# Patient Record
Sex: Male | Born: 1960 | Race: Black or African American | Hispanic: No | Marital: Single | Smoking: Current every day smoker
Health system: Southern US, Community
[De-identification: ages and names within clinical notes are randomized; demographics above are authoritative.]

## PROBLEM LIST (undated history)

## (undated) DIAGNOSIS — I739 Peripheral vascular disease, unspecified: Secondary | ICD-10-CM

## (undated) DIAGNOSIS — Z992 Dependence on renal dialysis: Secondary | ICD-10-CM

## (undated) DIAGNOSIS — I499 Cardiac arrhythmia, unspecified: Secondary | ICD-10-CM

## (undated) DIAGNOSIS — N186 End stage renal disease: Secondary | ICD-10-CM

## (undated) DIAGNOSIS — J189 Pneumonia, unspecified organism: Secondary | ICD-10-CM

## (undated) DIAGNOSIS — N189 Chronic kidney disease, unspecified: Secondary | ICD-10-CM

## (undated) DIAGNOSIS — J45909 Unspecified asthma, uncomplicated: Secondary | ICD-10-CM

## (undated) DIAGNOSIS — K7460 Unspecified cirrhosis of liver: Secondary | ICD-10-CM

## (undated) DIAGNOSIS — J449 Chronic obstructive pulmonary disease, unspecified: Secondary | ICD-10-CM

## (undated) DIAGNOSIS — I10 Essential (primary) hypertension: Secondary | ICD-10-CM

## (undated) DIAGNOSIS — K219 Gastro-esophageal reflux disease without esophagitis: Secondary | ICD-10-CM

## (undated) HISTORY — DX: End stage renal disease: N18.6

## (undated) HISTORY — DX: Cardiac arrhythmia, unspecified: I49.9

## (undated) HISTORY — DX: Unspecified asthma, uncomplicated: J45.909

## (undated) HISTORY — DX: End stage renal disease: Z99.2

## (undated) HISTORY — DX: Unspecified cirrhosis of liver: K74.60

---

## 1986-07-10 HISTORY — PX: OTHER SURGICAL HISTORY: SHX169

## 1989-07-10 HISTORY — PX: NECK SURGERY: SHX720

## 1998-02-04 ENCOUNTER — Emergency Department (HOSPITAL_COMMUNITY): Admission: EM | Admit: 1998-02-04 | Discharge: 1998-02-04 | Payer: Self-pay | Admitting: Emergency Medicine

## 2003-05-25 ENCOUNTER — Ambulatory Visit (HOSPITAL_COMMUNITY): Admission: RE | Admit: 2003-05-25 | Discharge: 2003-05-25 | Payer: Self-pay | Admitting: Family Medicine

## 2003-06-16 ENCOUNTER — Ambulatory Visit (HOSPITAL_COMMUNITY): Admission: RE | Admit: 2003-06-16 | Discharge: 2003-06-16 | Payer: Self-pay | Admitting: Family Medicine

## 2004-05-25 ENCOUNTER — Inpatient Hospital Stay (HOSPITAL_COMMUNITY): Admission: AD | Admit: 2004-05-25 | Discharge: 2004-05-30 | Payer: Self-pay | Admitting: Family Medicine

## 2004-05-25 ENCOUNTER — Ambulatory Visit (HOSPITAL_COMMUNITY): Admission: RE | Admit: 2004-05-25 | Discharge: 2004-05-25 | Payer: Self-pay | Admitting: Family Medicine

## 2004-06-09 ENCOUNTER — Ambulatory Visit (HOSPITAL_COMMUNITY): Admission: RE | Admit: 2004-06-09 | Discharge: 2004-06-09 | Payer: Self-pay | Admitting: Family Medicine

## 2004-11-21 ENCOUNTER — Ambulatory Visit (HOSPITAL_COMMUNITY): Admission: RE | Admit: 2004-11-21 | Discharge: 2004-11-21 | Payer: Self-pay | Admitting: Pulmonary Disease

## 2005-05-26 ENCOUNTER — Ambulatory Visit (HOSPITAL_COMMUNITY): Admission: RE | Admit: 2005-05-26 | Discharge: 2005-05-26 | Payer: Self-pay | Admitting: Pulmonary Disease

## 2005-11-13 ENCOUNTER — Ambulatory Visit (HOSPITAL_COMMUNITY): Admission: RE | Admit: 2005-11-13 | Discharge: 2005-11-13 | Payer: Self-pay | Admitting: Pulmonary Disease

## 2006-04-16 ENCOUNTER — Ambulatory Visit (HOSPITAL_COMMUNITY): Admission: RE | Admit: 2006-04-16 | Discharge: 2006-04-16 | Payer: Self-pay | Admitting: Pulmonary Disease

## 2006-10-24 ENCOUNTER — Ambulatory Visit (HOSPITAL_COMMUNITY): Admission: RE | Admit: 2006-10-24 | Discharge: 2006-10-24 | Payer: Self-pay | Admitting: Pulmonary Disease

## 2007-05-30 ENCOUNTER — Ambulatory Visit (HOSPITAL_COMMUNITY): Admission: RE | Admit: 2007-05-30 | Discharge: 2007-05-30 | Payer: Self-pay | Admitting: Pulmonary Disease

## 2008-01-30 ENCOUNTER — Ambulatory Visit (HOSPITAL_COMMUNITY): Admission: RE | Admit: 2008-01-30 | Discharge: 2008-01-30 | Payer: Self-pay | Admitting: Pulmonary Disease

## 2008-06-15 ENCOUNTER — Ambulatory Visit (HOSPITAL_COMMUNITY): Admission: RE | Admit: 2008-06-15 | Discharge: 2008-06-15 | Payer: Self-pay | Admitting: Pulmonary Disease

## 2008-12-28 ENCOUNTER — Ambulatory Visit (HOSPITAL_COMMUNITY): Admission: RE | Admit: 2008-12-28 | Discharge: 2008-12-28 | Payer: Self-pay | Admitting: Pulmonary Disease

## 2009-05-15 ENCOUNTER — Inpatient Hospital Stay (HOSPITAL_COMMUNITY): Admission: EM | Admit: 2009-05-15 | Discharge: 2009-05-17 | Payer: Self-pay | Admitting: Emergency Medicine

## 2009-06-07 ENCOUNTER — Ambulatory Visit (HOSPITAL_COMMUNITY): Admission: RE | Admit: 2009-06-07 | Discharge: 2009-06-07 | Payer: Self-pay | Admitting: Family Medicine

## 2009-07-12 ENCOUNTER — Ambulatory Visit (HOSPITAL_COMMUNITY): Admission: RE | Admit: 2009-07-12 | Discharge: 2009-07-12 | Payer: Self-pay | Admitting: Pulmonary Disease

## 2010-02-07 ENCOUNTER — Ambulatory Visit (HOSPITAL_COMMUNITY): Admission: RE | Admit: 2010-02-07 | Discharge: 2010-02-07 | Payer: Self-pay | Admitting: Pulmonary Disease

## 2010-06-27 ENCOUNTER — Ambulatory Visit (HOSPITAL_COMMUNITY)
Admission: RE | Admit: 2010-06-27 | Discharge: 2010-06-27 | Payer: Self-pay | Source: Home / Self Care | Attending: Pulmonary Disease | Admitting: Pulmonary Disease

## 2010-10-12 LAB — DIFFERENTIAL
Basophils Absolute: 0 10*3/uL (ref 0.0–0.1)
Basophils Relative: 0 % (ref 0–1)
Basophils Relative: 0 % (ref 0–1)
Eosinophils Absolute: 0 10*3/uL (ref 0.0–0.7)
Eosinophils Absolute: 0.1 10*3/uL (ref 0.0–0.7)
Lymphocytes Relative: 18 % (ref 12–46)
Monocytes Absolute: 1.1 10*3/uL — ABNORMAL HIGH (ref 0.1–1.0)
Monocytes Absolute: 1.3 10*3/uL — ABNORMAL HIGH (ref 0.1–1.0)
Monocytes Relative: 13 % — ABNORMAL HIGH (ref 3–12)
Monocytes Relative: 14 % — ABNORMAL HIGH (ref 3–12)
Neutro Abs: 4.8 10*3/uL (ref 1.7–7.7)
Neutro Abs: 7.1 10*3/uL (ref 1.7–7.7)
Neutro Abs: 9.9 10*3/uL — ABNORMAL HIGH (ref 1.7–7.7)
Neutrophils Relative %: 73 % (ref 43–77)

## 2010-10-12 LAB — COMPREHENSIVE METABOLIC PANEL
ALT: 42 U/L (ref 0–53)
Albumin: 2.6 g/dL — ABNORMAL LOW (ref 3.5–5.2)
Alkaline Phosphatase: 236 U/L — ABNORMAL HIGH (ref 39–117)
Alkaline Phosphatase: 258 U/L — ABNORMAL HIGH (ref 39–117)
BUN: 16 mg/dL (ref 6–23)
Calcium: 8.6 mg/dL (ref 8.4–10.5)
Chloride: 96 mEq/L (ref 96–112)
Creatinine, Ser: 2 mg/dL — ABNORMAL HIGH (ref 0.4–1.5)
Creatinine, Ser: 2.6 mg/dL — ABNORMAL HIGH (ref 0.4–1.5)
GFR calc Af Amer: 32 mL/min — ABNORMAL LOW (ref >60)
GFR calc Af Amer: 44 mL/min — ABNORMAL LOW (ref >60)
GFR calc non Af Amer: 27 mL/min — ABNORMAL LOW (ref >60)
Potassium: 4.6 mEq/L (ref 3.5–5.1)
Total Protein: 6.6 g/dL (ref 6.0–8.3)

## 2010-10-12 LAB — CBC
HCT: 32.9 % — ABNORMAL LOW (ref 39.0–52.0)
Hemoglobin: 11.4 g/dL — ABNORMAL LOW (ref 13.0–17.0)
MCHC: 34.6 g/dL (ref 30.0–36.0)
MCV: 101 fL — ABNORMAL HIGH (ref 78.0–100.0)
MCV: 101.1 fL — ABNORMAL HIGH (ref 78.0–100.0)
Platelets: 218 10*3/uL (ref 150–400)
Platelets: 241 10*3/uL (ref 150–400)
RBC: 3.26 MIL/uL — ABNORMAL LOW (ref 4.22–5.81)
RDW: 13.3 % (ref 11.5–15.5)
WBC: 13.6 10*3/uL — ABNORMAL HIGH (ref 4.0–10.5)

## 2010-10-12 LAB — CULTURE, BLOOD (ROUTINE X 2): Culture: NO GROWTH

## 2010-10-12 LAB — POCT CARDIAC MARKERS
CKMB, poc: <1 ng/mL — ABNORMAL LOW (ref 1.0–8.0)
Myoglobin, poc: 101 ng/mL (ref 12–200)
Troponin i, poc: <0.05 ng/mL (ref 0.00–0.09)

## 2010-10-12 LAB — RENAL FUNCTION PANEL
BUN: 14 mg/dL (ref 6–23)
CO2: 24 mEq/L (ref 19–32)
Chloride: 104 mEq/L (ref 96–112)
Glucose, Bld: 195 mg/dL — ABNORMAL HIGH (ref 70–99)
Potassium: 4.3 mEq/L (ref 3.5–5.1)

## 2010-10-12 LAB — RAPID URINE DRUG SCREEN, HOSP PERFORMED
Benzodiazepines: NOT DETECTED
Cocaine: NOT DETECTED
Opiates: POSITIVE — AB
Tetrahydrocannabinol: POSITIVE — AB

## 2010-10-12 LAB — VITAMIN B12: Vitamin B-12: 845 pg/mL (ref 211–911)

## 2010-10-12 LAB — MAGNESIUM: Magnesium: 2 mg/dL (ref 1.5–2.5)

## 2010-10-12 LAB — RETICULOCYTES
RBC.: 3.22 MIL/uL — ABNORMAL LOW (ref 4.22–5.81)
Retic Count, Absolute: 64.4 10*3/uL (ref 19.0–186.0)
Retic Ct Pct: 2 % (ref 0.4–3.1)

## 2010-10-12 LAB — IRON AND TIBC: UIBC: 248 ug/dL

## 2010-10-12 LAB — LEGIONELLA ANTIGEN, URINE: Legionella Antigen, Urine: NEGATIVE

## 2010-10-12 LAB — CALCIUM: Calcium: 8.3 mg/dL — ABNORMAL LOW (ref 8.4–10.5)

## 2010-11-25 NOTE — Consult Note (Signed)
Luke Mueller, Luke Mueller                 ACCOUNT NO.:  [id number]   MEDICAL RECORD NO.:  [id number]          PATIENT TYPE:  INP   LOCATION:  A305                          FACILITY:  [facility]   PHYSICIAN:  Edward L. Luan Pulling, M.D.DATE OF BIRTH:  12/06/1960   DATE OF CONSULTATION:  DATE OF DISCHARGE:                                   CONSULTATION   REASON FOR CONSULTATION:  Abnormal chest CT.   HISTORY OF PRESENT ILLNESS:  Luke Mueller is a 50 year old who has had right-  sided chest pain for 2-3 days prior to admission.  He underwent a chest x-  ray which showed a pneumothorax and he is admitted because of that.  He has  had some cough, general malaise and shortness of breath with exertion.   PAST MEDICAL HISTORY:  Positive for hypertension, COPD, chronic renal  insufficiency.  Positive for a neck fracture in 1992.  He had apparently a  cardiac contusion at that time as well.   CURRENT MEDICATIONS:  Lotrel 5/10.  Vicodin 5/500.  Skelaxin 800 mg and  Avelox.   SOCIAL HISTORY:  He smokes about one half pack of cigarettes daily and he  does drink a significant amount of alcohol.   FAMILY HISTORY:  His mother is alive at 77 with hypertension.  Father also  is 53, health status is unknown.   REVIEW OF SYSTEMS:  Except as mentioned essentially negative.  He does not  have any chronic productive cough.   PHYSICAL EXAMINATION:  GENERAL:  Shows a well-developed, well-nourished male  who is somewhat thin but in no acute distress.  VITAL SIGNS:  Temperature 97.8, pulse 74, respirations 20, blood pressure  138/90, O2 saturation is 100%.  CHEST:  Relatively clear.  HEART:  Regular.  ABDOMEN:  Soft.  EXTREMITIES:  Showed no edema.  NEUROLOGIC:  CNS is grossly intact.   LABORATORY DATA:  A pH 7.36, PCO2 42, PO2 82, white count 8000, hemoglobin  13.3.  Chest x-ray showed a moderate hydropneumothorax and a CT shows that  he has a right pneumothorax about 20% with a small right effusion.  There  are  blebs in the right lung apex.  There is some on the left.  On the right  there was also a focal area of bronchiectasis.   ASSESSMENT:  He has blebs and they are mostly on the right lung, some on the  left.  He is at risk of another pneumothorax but generally is felt that it  is best to wait for at least two episodes of pneumothoraces before any  procedures are undertaken to try to correct blebs.  If he did have another  pneumothorax he should probably be referred for pleurodesis.  In addition to  that he does have a small area of bronchiectasis which I believe is  asymptomatic.  He does have some cough but he has not got typical  bronchiectasis with cough productive of sputum, etc.  I do not think he  needs any treatment for this unless he develops symptoms.  Thanks for  allowing me to see him with  you.     Edwa   ELH/MEDQ  D:  05/26/2004  T:  05/27/2004  Job:  [id number]   cc:   Barrie Folk. Berdine Addison, MD  [address]  [place]  [place] [address]  Fax: [phone]

## 2010-11-25 NOTE — Discharge Summary (Signed)
NAMEJACORRI, CROMBIE                 ACCOUNT NO.:  [id number]   MEDICAL RECORD NO.:  [id number]          PATIENT TYPE:  INP   LOCATION:  A305                          FACILITY:  [facility]   PHYSICIAN:  Barrie Folk. Berdine Addison, MD     DATE OF BIRTH:  [birth year]   DATE OF ADMISSION:  05/25/2004  DATE OF DISCHARGE:  11/21/2005LH                                 DISCHARGE SUMMARY   HISTORY OF PRESENT ILLNESS:  The patient was a 50 year old single,  unemployed black male from [place], [place].   CHIEF COMPLAINT:  Right chest pain.   PAST MEDICAL HISTORY:  History was positive for right chest pain x24 hours  before admitting him.  The pain was described as chest discomfort  exacerbated by cough.  The cough was occasionally productive.  The sputum  was described as white.  History is also positive for general malaise x48  hours.  He denied fever, chills, nausea, vomiting, hemoptysis, shortness of  breath and wheezing.  The patient did experience shortness of breath with  mild exertion.   Past medical history was positive for chronic, obstructive pulmonary  disease, chronic renal insufficiency and hypertension.   ALLERGIES:  The patient was not allergic to any known medication.   HABITS:  Positive for cigarette smoking, one-half pack per day; started  August 16, ethanol use (beer) two 40-ounces per day; starting age 73, and  former use of street drugs, (marijuana and cocaine).  The patient denied the  use of IV drugs.  Sexually transmitted disease history was positive for  gonorrhea infection in his 20's treated at the emergency room at [facility], [place].   PAST MEDICAL HISTORY:  Past hospitalization for neck fracture in 1992,  treated in [place], [place] by surgery, and hospitalization in 1994  for trouble holding urine at [place] [employer], [place].   FAMILY HISTORY:  Mother living at age 78 with a history of hypertension.  Father living at age 76, health unknown.   PROBLEMS:   Right chest pain secondary to acute right hydropneumothorax.  General appearance revealed a small frame, middle-aged, medium height black  male who appeared not to feel well, but in no apparent respiratory distress.  Vital signs in the office, pulse 80, respirations 18, blood pressure 100/74,  O2 saturations 98% on room air.  Lungs demonstrated no air movement on  auscultation of the right lung field.  Auscultation of the left lung field  demonstrated air movement.  Heart revealed audible S1 and S2 without murmur.  Rhythm was regular and rate was within normal limits.  The patient was  neurologically intact.   LABORATORY DATA:  Significant labs on admission were as follows:  ABGs on  room air on May 25, 2004, at [place] [facility], at rest:  pH 7.36, pCO2 42.7, pO2  82.6, O2 saturations 95.2.  White count 8, hemoglobin 13.3, hematocrit 38.3.  Platelets 286,000.  Sodium 138, potassium 4.4, chloride 105, CO2 24.   Chest x-ray on admission demonstrated the presence of hydropneumothorax.   CT of the chest with contrast on May 25, 2004, was read as follows:  1.  Stable right hydropneumothorax when compared to chest x-ray earlier      today.  2.  Multiple blebs in the right apex and the medial right lower lobe      extending to the azygoesophageal recess.  3.  A few scattered blebs involving the left lung.  4.  Chronic obstructive pulmonary disease (emphysema and chronic      bronchitis).  The lungs themselves were clear as read by  Dr. Evangeline Dakin.   The patient was treated with IV fluids, oxygen at two liters per minute via  nasal cannula, analgesia for pain, O2 saturation q.6h. x48 hours, and other  supportive measures.  Moreover, pulmonary consult was ordered.  Dr. Luan Pulling,  pulmonologist, did see the patient during this hospitalization.  Dr. Luan Pulling  saw the patient on May 26, 2004, and his impression was that the  patient had blebs, and they are mostly on the right lung,  some on the left.  He indicated that he was at risk for another pneumothorax and generally felt  that it was best to wait for at least two episodes of pneumothoraces before  any procedure is undertaken to try to correct blebs.  He indicated also that  if the patient did have another pneumothorax, he should probably be referred  for pleurodesis.  Also, Dr. Luan Pulling indicated the patient had a small area  of bronchiectasis which he believed was asymptomatic.  He indicated that his  cough did not reflect typical bronchiectasis since it was productive.  He  indicated that he thought the patient did not need any treatment unless he  developed symptoms.   HOSPITAL COURSE:  The patient's hospital course was uneventful.  He was  treated with Zithromax 500 mg IV daily x2 and then 250 mg IV daily, and  Levaquin 750 mg p.o. daily.  Moreover, he was treated with guaifenesin 600  mg p.o. b.i.d.  The patient experienced decreased pneumothorax by chest x-  ray on May 30, 2004, compared to May 27, 2004.  Radiology read it  as small right pneumothorax and improving expansion of right lung.  Moreover, no effusion or segmental consolidation were seen.  The left lung  field was clear.   Problem #1.  Chronic bronchitic change was present as read by Dr. Lavonia Dana.  The patient was discharged home on May 30, 2004, on  antibiotics.  He was advised to come to the emergency room if he experienced  shortness of breath or significant right chest pain. He was ambulatory on  room air without problem at the time of discharge.  He was alert and  oriented to person, place and time.  He was able to lie flat in bed without  discomfort.  Appetite had improved.   Problem #2.  Hypertension.  Blood pressure on admission was 100/74.  The  patient was treated with a low-sodium diet and Lotrel.  The patient  developed some swelling of his upper lip during this hospitalization. Therefore, Lotrel was stopped.  He  was treated with Solu-Medrol IV q.6h. x24  hours.  The patient did not experience swelling of the tongue or throat.  Swelling of lip was resolved at the time of discharge.  Verapamil was  elected to treat blood pressure during the remainder of the hospitalization.  The patient's blood pressure was 120/84 on the morning of discharge.   Problem #3.  Chronic renal insufficiency.  BUN on admission was 26 and  creatinine 3.  The patient is being followed by Dr. Alison Murray,  pertaining to his renal condition.  He will continue to be monitored and  managed by Dr. Lowanda Foster as an outpatient.  At the time of discharge, he had  no swelling of legs or face.  His appetite improved with treatment of his  chronic bronchitis with antibiotics.   Problem #4.  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  The patient was treated with topical analgesic, p.o. analgesic  and muscle relaxant.  The patient did get some relief.   DISCHARGE INSTRUCTIONS:  1.  Diet:  Low salt.  2.  Activity:  No restrictions.   DISCHARGE MEDICATIONS:  1.  Verelan PM 200 mg, one capsule p.o. every bedtime.  2.  Myoflex cream to neck twice a day.  3.  Avelox 400 mg, one tablet every day.  4.  Robitussin two teaspoons p.o. four times a day.  5.  Vicodin 5/500, one tablet every 12 hours.  6.  Skelaxin 800 mg, one tablet every day as needed.   FOLLOWUP:  1.  With Dr. Berdine Addison x1 week.  2.  With Dr. Luan Pulling x2 weeks.   PRIMARY DIAGNOSES:  1.  Right chest pain secondary to acute right hydropneumothorax.  2.  Chronic obstructive pulmonary disease with bronchiectasis.   SECONDARY DIAGNOSES:  1.  Hypertension.  2.  Chronic renal insufficiency.  3.  Chronic neck pain, status post surgery secondary to trauma.     Andi Devon  D:  06/02/2004  T:  06/02/2004  Job:  [id number]

## 2010-11-25 NOTE — H&P (Signed)
NAMEDEKARI, KOETTING                 ACCOUNT NO.:  [id number]   MEDICAL RECORD NO.:  [id number]          PATIENT TYPE:  INP   LOCATION:  A305                          FACILITY:  [facility]   PHYSICIAN:  Barrie Folk. Berdine Addison, MD     DATE OF BIRTH:  03/03/1961   DATE OF ADMISSION:  05/25/2004  DATE OF DISCHARGE:  LH                                HISTORY & PHYSICAL   IDENTIFYING DATA:  The patient is a 50 year old single, unemployed black  male from [place], [place].   CHIEF COMPLAINT:  Right chest pain.   HISTORY OF PRESENT ILLNESS:  History is positive for right chest pain x24  hours.  Pain is described as a chest discomfort exacerbated by cough.  The  cough is occasionally productive.  Sputum is described as white.  He also  complains of general malaise x48 hours.  He denies fever, chills, nausea,  vomiting, hemoptysis, shortness of breath at rest and wheezing.  The patient  experienced some shortness of breath with moderate exertion.   PAST MEDICAL HISTORY:  Medical history is positive here for chronic  obstructive pulmonary disease, chronic renal insufficiency, and  hypertension.  Medical history is negative for diabetes, tuberculosis,  cancer, sickle cell, asthma and seizure disorder.   MEDICATIONS ON ADMISSION:  1.  Lotrel 5/10, one capsule p.o. every day.  2.  Vicodin 5/500, one tablet p.o. q.12h. p.r.n.  3.  Skelaxin 800 mg p.o. daily.  4.  Avelox 400 mg p.o. daily.   ALLERGIES:  The patient is not allergic to any known medication.   HABITS:  Positive for cigarette smoking, one-half pack per day, starting at  age 72, and ethanol use (beer, two 40-ounces per day) starting at age 58.  Habits positive for former use of street drugs (marijuana and cocaine).  The  patient denies the use of IV drugs.  Sexually-transmitted disease history is  positive for gonorrhea infection in his 26, treated at emergency room at  [facility], [place].  Sexually transmitted disease history is  negative  for syphilis, herpes, and HIV infection.   PAST MEDICAL HISTORY:  Positive for hospitalization for neck fracture in  1992 in [place], [place], and hospitalization in 1994 for trouble  holding urine, in [place], [place].   FAMILY HISTORY:  Mother living at 19 with history of hypertension.  Father  is living at 67, health unknown.   REVIEW OF SYSTEMS:  Positive for chronic poor appetite since February of  2004, weight loss of approximately 10 pounds over one year, chronic neck  pain.  Review of systems is negative for hemoptysis, chronic headache,  epistaxis, bleeding gums, hematemesis, gross hematuria, dysuria, gross  hematuria, melena, diarrhea, skin rash, adenopathy involving the neck and  groin, penile lesions, penile discharge, edema of legs, night sweats,  dysphagia, unexplained fever, etc.   PHYSICAL EXAMINATION:  VITAL SIGNS:  Vitals in the office, pulse 80,  respirations 18, blood pressure 100/74.  O2 saturations 98% on room air.  GENERAL APPEARANCE:  A middle-aged, small frame, medium height, alert, black  male who does not look well, but in no apparent respiratory distress.  HEENT:  Positive for thinning of hair.  Ears:  Normal auricle.  External  canals patent.  Tympanic membranes are pearly gray.  Eyes:  Lids negative  for ptosis.  Sclerae are white.  Positive arcus senilis bilaterally.  Pupils  round, equal and reactive to light.  Extraocular movement intact.  Nose  negative for discharge.  Mouth, positive for missing teeth. The remaining  dentition is fair.  No bleeding gums.  Posterior pharynx is benign.  NECK:  Posterior, positive for old, healed vertical surgical scar.  No  cervical adenopathy.  No thyromegaly in supraclavicular space.  No palpable  nodes.  LUNGS:  No air movement on auscultation of right lung field.  Positive for  air movement of left lung field on auscultation.  HEART:  Audible S1 and S2 without murmur.  Regular rate and  rhythm.  ABDOMEN:  Flat, hyperactive bowel sounds, soft, nontender in all four  quadrant.  No palpable mass and no organomegaly.  GENITALIA:  Penis uncircumcised.  No penile lesion or discharge.  Scrotum,  palpable testicles without nausea or tenderness.  RECTUM:  No external lesion.  EXTREMITIES:  No edema, no joint swelling, no joint redness, no joint  hotness.  NEUROLOGIC:  Alert and oriented to person, place and time.  Cranial nerves  II-XII appeared intact.   LABORATORY DATA:  ABG's on room air, pH 7.36, pCO2 42.7, pO2 82.6, O2  saturations 95.2%.  White count 8.0, hemoglobin 13.3, hematocrit 38.3,  platelets 286,000.  Urine drug screen negative.  Urinalysis, pH 6.0,  specific gravity 1.015, no glucose, no bilirubin, no ketones, no blood, no  protein, negative for nitrites, no leukocytes.   Chest [facility] on May 25, 2003, at [facility] is read as COPD  without evidence of acute cardiopulmonary disease.  Chest x-ray on May 25, 2004, at [facility] was read as small to moderate right  hydropneumothorax with mild right upper lobe volume loss as read by Dr. Marylynn Pearson.   PRIMARY DIAGNOSIS:  Right chest discomfort secondary to right  hydropneumothorax.   SECONDARY DIAGNOSES:  1.  Chronic obstructive pulmonary disease.  2.  Hypertension.  3.  Chronic renal insufficiency.   PLAN:  1.  IV normal saline at [facility].  2.  Surgical evaluation of right lung volume loss.  3.  Pulmonary consultation by Dr. Luan Pulling.  4.  Diet:  4 grams sodium.   DISCHARGE INSTRUCTIONS:  1.  Activity as tolerated.  2.  O2 at two liters via nasal cannula p.r.n.  3.  Lotrel 5/10, one capsule p.o. every day.  4.  Skelaxin 800 mg p.o. daily.  5.  Analgesic, Vicodin 5/500 mg, one tablet p.o. q.6-8h. p.r.n. for pain.  6.  CT of the chest.     Andi Devon  D:  05/25/2004  T:  05/25/2004  Job:  [id number]

## 2011-01-20 ENCOUNTER — Other Ambulatory Visit (HOSPITAL_COMMUNITY): Payer: Self-pay | Admitting: Pulmonary Disease

## 2011-01-20 ENCOUNTER — Ambulatory Visit (HOSPITAL_COMMUNITY)
Admission: RE | Admit: 2011-01-20 | Discharge: 2011-01-20 | Disposition: A | Discharge status: Home / Self Care | Payer: Self-pay | Source: Ambulatory Visit | Attending: Pulmonary Disease | Admitting: Pulmonary Disease

## 2011-01-20 DIAGNOSIS — J449 Chronic obstructive pulmonary disease, unspecified: Secondary | ICD-10-CM

## 2011-01-20 DIAGNOSIS — J4489 Other specified chronic obstructive pulmonary disease: Secondary | ICD-10-CM | POA: Insufficient documentation

## 2011-05-11 ENCOUNTER — Ambulatory Visit (HOSPITAL_COMMUNITY)
Admission: RE | Admit: 2011-05-11 | Discharge: 2011-05-11 | Disposition: A | Discharge status: Home / Self Care | Payer: Self-pay | Source: Ambulatory Visit | Attending: Pulmonary Disease | Admitting: Pulmonary Disease

## 2011-05-11 ENCOUNTER — Other Ambulatory Visit (HOSPITAL_COMMUNITY): Payer: Self-pay | Admitting: Pulmonary Disease

## 2011-05-11 DIAGNOSIS — J9383 Other pneumothorax: Secondary | ICD-10-CM | POA: Insufficient documentation

## 2011-05-11 DIAGNOSIS — J4489 Other specified chronic obstructive pulmonary disease: Secondary | ICD-10-CM | POA: Insufficient documentation

## 2011-05-11 DIAGNOSIS — J449 Chronic obstructive pulmonary disease, unspecified: Secondary | ICD-10-CM | POA: Insufficient documentation

## 2011-05-11 DIAGNOSIS — R0789 Other chest pain: Secondary | ICD-10-CM | POA: Insufficient documentation

## 2011-10-17 ENCOUNTER — Other Ambulatory Visit (HOSPITAL_COMMUNITY): Payer: Self-pay | Admitting: Pulmonary Disease

## 2011-10-17 ENCOUNTER — Ambulatory Visit (HOSPITAL_COMMUNITY)
Admission: RE | Admit: 2011-10-17 | Discharge: 2011-10-17 | Disposition: A | Discharge status: Home / Self Care | Payer: Medicaid Other | Source: Ambulatory Visit | Attending: Pulmonary Disease | Admitting: Pulmonary Disease

## 2011-10-17 DIAGNOSIS — J45909 Unspecified asthma, uncomplicated: Secondary | ICD-10-CM | POA: Insufficient documentation

## 2012-04-03 ENCOUNTER — Telehealth: Payer: Self-pay

## 2012-04-03 NOTE — Telephone Encounter (Signed)
Called again. Still busy.

## 2012-04-03 NOTE — Telephone Encounter (Signed)
Called. Line busy

## 2012-04-04 ENCOUNTER — Telehealth: Payer: Self-pay

## 2012-04-04 NOTE — Telephone Encounter (Signed)
Called pt. He was just waking up. Luke Mueller he will gets his meds/insurance together and give me a call a little later.

## 2012-04-04 NOTE — Telephone Encounter (Signed)
Luke Mueller called, returning your call. Please call him back. Thanks.

## 2012-04-05 NOTE — Telephone Encounter (Signed)
LMOM to call.

## 2012-04-10 ENCOUNTER — Telehealth: Payer: Self-pay

## 2012-04-10 NOTE — Telephone Encounter (Signed)
Gastroenterology Pre-Procedure Form    Request Date: 04/10/2012     Requesting Physician: Legrand Rams     PATIENT INFORMATION:  Luke Mueller is a 51 y.o., male (DOB=1961-06-08).  PROCEDURE: Procedure(s) requested: colonoscopy Procedure Reason: screening for colon cancer  PATIENT REVIEW QUESTIONS: The patient reports the following:   1. Diabetes Melitis: no 2. Joint replacements in the past 12 months: no 3. Major health problems in the past 3 months: no 4. Has an artificial valve or MVP:no 5. Has been advised in past to take antibiotics in advance of a procedure like teeth cleaning: no}    MEDICATIONS & ALLERGIES:    Patient reports the following regarding taking any blood thinners:   Plavix? no Aspirin?no Coumadin?  no  Patient confirms/reports the following medications:  Current Outpatient Prescriptions  Medication Sig Dispense Refill  . albuterol (PROVENTIL HFA;VENTOLIN HFA) 108 (90 BASE) MCG/ACT inhaler Inhale 2 puffs into the lungs every 6 (six) hours as needed.      Marland Kitchen amLODipine (NORVASC) 10 MG tablet Take 10 mg by mouth daily.      . carvedilol (COREG) 12.5 MG tablet Take 12.5 mg by mouth 2 (two) times daily with a meal.      . cyclobenzaprine (FLEXERIL) 10 MG tablet Take 10 mg by mouth 3 (three) times daily as needed.      . NON FORMULARY Dulera   Inhaler as directed      . NON FORMULARY Vitamin D   0.25 mcg daily      . sodium bicarbonate 650 MG tablet Take 650 mg by mouth 1 day or 1 dose.      . traMADol (ULTRAM) 50 MG tablet Take 50 mg by mouth every 6 (six) hours as needed.        Patient confirms/reports the following allergies:  Allergies  Allergen Reactions  . Lotrel (Amlodipine Besy-Benazepril Hcl) Swelling    Lips swelling    Patient is appropriate to schedule for requested procedure(s): yes  AUTHORIZATION INFORMATION Primary Insurance:   ID #:   Group #:  Pre-Cert / Auth required:  Pre-Cert / Auth #:   Secondary Insurance:   ID #:   Group #:  Pre-Cert  / Auth required: Pre-Cert / Auth #:  No orders of the defined types were placed in this encounter.    SCHEDULE INFORMATION: Procedure has been scheduled as follows:  Date: 04/15/2012    Time: 2:30 PM  Location: [facility] Short Stay  This Gastroenterology Pre-Precedure Form is being routed to the following provider(s) for review: Barney Drain, MD

## 2012-04-10 NOTE — Telephone Encounter (Signed)
See triage phone call.

## 2012-04-11 ENCOUNTER — Other Ambulatory Visit: Payer: Self-pay

## 2012-04-11 DIAGNOSIS — Z139 Encounter for screening, unspecified: Secondary | ICD-10-CM

## 2012-04-11 MED ORDER — PEG-KCL-NACL-NASULF-NA ASC-C 100 G PO SOLR: 1.0000 | ORAL | Status: DC

## 2012-04-11 NOTE — Telephone Encounter (Signed)
Rx was sent to [facility]. Pt will come by the office Friday 04/12/2012 about 9:00 AM to go over his prep instructions.

## 2012-04-11 NOTE — Telephone Encounter (Signed)
MOVI PREP SPLIT DOSING, REGULAR BREAKFAST. CLEAR LIQUIDS AFTER 9 AM.  

## 2012-04-12 ENCOUNTER — Telehealth: Payer: Self-pay

## 2012-04-12 NOTE — Telephone Encounter (Signed)
No will try a TCS IF CAN'T GET HIM TO SLEEP CAN ALWAYS DO A FLEX SIG.         ----- Message -----   From: Anell Barr, LPN   Sent: [id number] 9:11 AM   To: Danie Binder, MD      Dr. Oneida Alar, I had this pt come by for his instructions for his prep for his procedure on Mon. When he came in, I smelled alcohol. So I asked him that in addition to his meds does he drink alcoholic beverages. He said 6 pack of beer about every other day. Occasionally a litter liquor. DO you want him rescheduled from [facility]? Please advise!

## 2012-04-15 ENCOUNTER — Ambulatory Visit (HOSPITAL_COMMUNITY)
Admission: RE | Admit: 2012-04-15 | Discharge: 2012-04-15 | Disposition: A | Discharge status: Home / Self Care | Payer: Medicaid Other | Source: Ambulatory Visit | Attending: Gastroenterology | Admitting: Gastroenterology

## 2012-04-15 ENCOUNTER — Encounter (HOSPITAL_COMMUNITY)
Admission: RE | Disposition: A | Discharge status: Home / Self Care | Payer: Self-pay | Source: Ambulatory Visit | Attending: Gastroenterology

## 2012-04-15 ENCOUNTER — Encounter (HOSPITAL_COMMUNITY): Payer: Self-pay | Admitting: *Deleted

## 2012-04-15 DIAGNOSIS — Z1211 Encounter for screening for malignant neoplasm of colon: Secondary | ICD-10-CM

## 2012-04-15 DIAGNOSIS — J4489 Other specified chronic obstructive pulmonary disease: Secondary | ICD-10-CM | POA: Insufficient documentation

## 2012-04-15 DIAGNOSIS — J449 Chronic obstructive pulmonary disease, unspecified: Secondary | ICD-10-CM | POA: Insufficient documentation

## 2012-04-15 DIAGNOSIS — I10 Essential (primary) hypertension: Secondary | ICD-10-CM | POA: Insufficient documentation

## 2012-04-15 DIAGNOSIS — K648 Other hemorrhoids: Secondary | ICD-10-CM

## 2012-04-15 HISTORY — DX: Essential (primary) hypertension: I10

## 2012-04-15 HISTORY — PX: COLONOSCOPY: SHX5424

## 2012-04-15 HISTORY — DX: Chronic obstructive pulmonary disease, unspecified: J44.9

## 2012-04-15 HISTORY — DX: Chronic kidney disease, unspecified: N18.9

## 2012-04-15 SURGERY — COLONOSCOPY
Anesthesia: Moderate Sedation

## 2012-04-15 MED ORDER — STERILE WATER FOR IRRIGATION IR SOLN
Status: DC | PRN
  Administered 2012-04-15: 15:00:00

## 2012-04-15 MED ORDER — MIDAZOLAM HCL 5 MG/5ML IJ SOLN
INTRAMUSCULAR | Status: DC | PRN
  Administered 2012-04-15: 1 mg via INTRAVENOUS
  Administered 2012-04-15 (×2): 2 mg via INTRAVENOUS

## 2012-04-15 MED ORDER — MEPERIDINE HCL 100 MG/ML IJ SOLN
INTRAMUSCULAR | Status: DC | PRN
  Administered 2012-04-15 (×2): 50 mg via INTRAVENOUS

## 2012-04-15 MED ORDER — HYDRALAZINE HCL 20 MG/ML IJ SOLN
INTRAMUSCULAR | Status: AC
  Filled 2012-04-15: qty 1

## 2012-04-15 MED ORDER — HYDRALAZINE HCL 20 MG/ML IJ SOLN
INTRAMUSCULAR | Status: DC | PRN
  Administered 2012-04-15: 10 mg via INTRAVENOUS

## 2012-04-15 MED ORDER — MEPERIDINE HCL 100 MG/ML IJ SOLN
INTRAMUSCULAR | Status: AC
  Filled 2012-04-15: qty 2

## 2012-04-15 MED ORDER — MIDAZOLAM HCL 5 MG/5ML IJ SOLN
INTRAMUSCULAR | Status: AC
  Filled 2012-04-15: qty 10

## 2012-04-15 MED ORDER — SODIUM CHLORIDE 0.9 % IV SOLN
INTRAVENOUS | Status: DC
  Administered 2012-04-15: 500 mL via INTRAVENOUS

## 2012-04-15 NOTE — Progress Notes (Signed)
Blood pressure high intra operatively Dr Oneida Alar ordered 10mg  hydralazine. Given per Eldwin Volkov [place],RN.

## 2012-04-15 NOTE — Op Note (Signed)
Luke Mueller Luke Mueller Mueller, [id number]   COLONOSCOPY PROCEDURE REPORT  PATIENT: Luke Mueller, Luke Mueller Mueller  MR#: [id number] BIRTHDATE: [birth year] , 51  yrs. old GENDER: Male ENDOSCOPIST: Barney Drain, MD REFERRED [id number] Luke Mueller Mueller, M.D. PROCEDURE DATE:  04/15/2012 PROCEDURE:   Colonoscopy, screening INDICATIONS:average risk patient for colon cancer. MEDICATIONS: Demerol 100 mg IV and Versed 5 mg IV  HYDRALAZINE 10 MG IV  DESCRIPTION OF PROCEDURE:    Physical exam was performed.  Informed consent was obtained from the patient after explaining the benefits, risks, and alternatives to procedure.  The patient was connected to monitor and placed in left lateral position. Continuous oxygen was provided by nasal cannula and IV medicine administered through an indwelling cannula.  After administration of sedation and rectal exam, the patients rectum was intubated and the [id number]  colonoscope was advanced under direct visualization to the ileum.  The scope was removed slowly by carefully examining the color, texture, anatomy, and integrity mucosa on the way out.  The patient was recovered in endoscopy and discharged home in satisfactory condition.       COLON FINDINGS: The colon was otherwise normal.  There was no diverticulosis, inflammation, polyps or cancers unless previously stated.  , The mucosa appeared normal in the terminal ileum.  , and Large internal hemorrhoids were found.  PREP QUALITY: excellent. CECAL W/D TIME: 10 minutes  COMPLICATIONS: None  ENDOSCOPIC IMPRESSION: 1.   The colon was otherwise normal 2.   Normal mucosa in the terminal ileum 3.   Large internal hemorrhoids   RECOMMENDATIONS: HIGH FIBER DIET TCS IN 10 YEARS       _______________________________ eSignedBarney Drain, MD 04/15/2012 3:07 PM
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
## 2012-04-24 ENCOUNTER — Encounter (HOSPITAL_COMMUNITY): Payer: Self-pay | Admitting: Gastroenterology

## 2012-04-29 ENCOUNTER — Ambulatory Visit (HOSPITAL_COMMUNITY)
Admission: RE | Admit: 2012-04-29 | Discharge: 2012-04-29 | Disposition: A | Discharge status: Home / Self Care | Payer: Medicaid Other | Source: Ambulatory Visit | Attending: Pulmonary Disease | Admitting: Pulmonary Disease

## 2012-04-29 ENCOUNTER — Other Ambulatory Visit (HOSPITAL_COMMUNITY): Payer: Self-pay | Admitting: Pulmonary Disease

## 2012-04-29 DIAGNOSIS — J45909 Unspecified asthma, uncomplicated: Secondary | ICD-10-CM

## 2012-04-29 DIAGNOSIS — R0609 Other forms of dyspnea: Secondary | ICD-10-CM | POA: Insufficient documentation

## 2012-04-29 DIAGNOSIS — R0989 Other specified symptoms and signs involving the circulatory and respiratory systems: Secondary | ICD-10-CM | POA: Insufficient documentation

## 2012-08-13 ENCOUNTER — Other Ambulatory Visit (HOSPITAL_COMMUNITY): Payer: Self-pay | Admitting: Internal Medicine

## 2012-08-13 DIAGNOSIS — I739 Peripheral vascular disease, unspecified: Secondary | ICD-10-CM

## 2012-08-14 ENCOUNTER — Other Ambulatory Visit (HOSPITAL_COMMUNITY): Payer: Self-pay | Admitting: Internal Medicine

## 2012-08-14 ENCOUNTER — Ambulatory Visit (HOSPITAL_COMMUNITY)
Admission: RE | Admit: 2012-08-14 | Discharge: 2012-08-14 | Disposition: A | Discharge status: Home / Self Care | Payer: Medicaid Other | Source: Ambulatory Visit | Attending: Internal Medicine | Admitting: Internal Medicine

## 2012-08-14 DIAGNOSIS — I739 Peripheral vascular disease, unspecified: Secondary | ICD-10-CM

## 2012-08-14 DIAGNOSIS — I743 Embolism and thrombosis of arteries of the lower extremities: Secondary | ICD-10-CM | POA: Insufficient documentation

## 2012-09-05 ENCOUNTER — Other Ambulatory Visit (HOSPITAL_COMMUNITY): Payer: Self-pay | Admitting: Pulmonary Disease

## 2012-09-05 ENCOUNTER — Ambulatory Visit (HOSPITAL_COMMUNITY)
Admission: RE | Admit: 2012-09-05 | Discharge: 2012-09-05 | Disposition: A | Discharge status: Home / Self Care | Payer: Medicaid Other | Source: Ambulatory Visit | Attending: Pulmonary Disease | Admitting: Pulmonary Disease

## 2012-09-05 DIAGNOSIS — R079 Chest pain, unspecified: Secondary | ICD-10-CM

## 2012-10-01 ENCOUNTER — Emergency Department (HOSPITAL_COMMUNITY)
Admission: EM | Admit: 2012-10-01 | Discharge: 2012-10-01 | Disposition: A | Discharge status: Home / Self Care | Payer: Medicaid Other | Attending: Emergency Medicine | Admitting: Emergency Medicine

## 2012-10-01 ENCOUNTER — Encounter (HOSPITAL_COMMUNITY): Payer: Self-pay | Admitting: *Deleted

## 2012-10-01 DIAGNOSIS — Z79899 Other long term (current) drug therapy: Secondary | ICD-10-CM | POA: Insufficient documentation

## 2012-10-01 DIAGNOSIS — N189 Chronic kidney disease, unspecified: Secondary | ICD-10-CM | POA: Insufficient documentation

## 2012-10-01 DIAGNOSIS — I129 Hypertensive chronic kidney disease with stage 1 through stage 4 chronic kidney disease, or unspecified chronic kidney disease: Secondary | ICD-10-CM | POA: Insufficient documentation

## 2012-10-01 DIAGNOSIS — I959 Hypotension, unspecified: Secondary | ICD-10-CM

## 2012-10-01 DIAGNOSIS — R42 Dizziness and giddiness: Secondary | ICD-10-CM | POA: Insufficient documentation

## 2012-10-01 DIAGNOSIS — E878 Other disorders of electrolyte and fluid balance, not elsewhere classified: Secondary | ICD-10-CM

## 2012-10-01 DIAGNOSIS — F17200 Nicotine dependence, unspecified, uncomplicated: Secondary | ICD-10-CM | POA: Insufficient documentation

## 2012-10-01 DIAGNOSIS — J4489 Other specified chronic obstructive pulmonary disease: Secondary | ICD-10-CM | POA: Insufficient documentation

## 2012-10-01 DIAGNOSIS — J449 Chronic obstructive pulmonary disease, unspecified: Secondary | ICD-10-CM | POA: Insufficient documentation

## 2012-10-01 DIAGNOSIS — IMO0001 Reserved for inherently not codable concepts without codable children: Secondary | ICD-10-CM | POA: Insufficient documentation

## 2012-10-01 DIAGNOSIS — E871 Hypo-osmolality and hyponatremia: Secondary | ICD-10-CM | POA: Insufficient documentation

## 2012-10-01 DIAGNOSIS — I739 Peripheral vascular disease, unspecified: Secondary | ICD-10-CM | POA: Insufficient documentation

## 2012-10-01 DIAGNOSIS — R55 Syncope and collapse: Secondary | ICD-10-CM | POA: Insufficient documentation

## 2012-10-01 DIAGNOSIS — R5381 Other malaise: Secondary | ICD-10-CM | POA: Insufficient documentation

## 2012-10-01 DIAGNOSIS — E860 Dehydration: Secondary | ICD-10-CM | POA: Insufficient documentation

## 2012-10-01 DIAGNOSIS — I951 Orthostatic hypotension: Secondary | ICD-10-CM | POA: Insufficient documentation

## 2012-10-01 HISTORY — DX: Peripheral vascular disease, unspecified: I73.9

## 2012-10-01 LAB — TROPONIN I: Troponin I: <0.3 ng/mL (ref <0.30)

## 2012-10-01 LAB — COMPREHENSIVE METABOLIC PANEL
ALT: 17 U/L (ref 0–53)
Alkaline Phosphatase: 91 U/L (ref 39–117)
BUN: 65 mg/dL — ABNORMAL HIGH (ref 6–23)
CO2: 20 mEq/L (ref 19–32)
Chloride: 95 mEq/L — ABNORMAL LOW (ref 96–112)
GFR calc Af Amer: 12 mL/min — ABNORMAL LOW (ref >90)
GFR calc non Af Amer: 10 mL/min — ABNORMAL LOW (ref >90)
Glucose, Bld: 113 mg/dL — ABNORMAL HIGH (ref 70–99)
Potassium: 4.3 mEq/L (ref 3.5–5.1)
Total Bilirubin: 0.7 mg/dL (ref 0.3–1.2)

## 2012-10-01 LAB — CBC WITH DIFFERENTIAL/PLATELET
Basophils Absolute: 0 10*3/uL (ref 0.0–0.1)
Basophils Relative: 0 % (ref 0–1)
Lymphocytes Relative: 29 % (ref 12–46)
MCHC: 34.8 g/dL (ref 30.0–36.0)
Neutro Abs: 4 10*3/uL (ref 1.7–7.7)
Platelets: 212 10*3/uL (ref 150–400)
RDW: 12.2 % (ref 11.5–15.5)
WBC: 6.1 10*3/uL (ref 4.0–10.5)

## 2012-10-01 MED ORDER — ROCURONIUM BROMIDE 50 MG/5ML IV SOLN
INTRAVENOUS | Status: AC
  Filled 2012-10-01: qty 2

## 2012-10-01 MED ORDER — SODIUM CHLORIDE 0.9 % IV SOLN: 1000.0000 mL | INTRAVENOUS | Status: DC

## 2012-10-01 MED ORDER — ETOMIDATE 2 MG/ML IV SOLN
INTRAVENOUS | Status: AC
  Filled 2012-10-01: qty 20

## 2012-10-01 MED ORDER — SODIUM CHLORIDE 0.9 % IV SOLN
1000.0000 mL | Freq: Once | INTRAVENOUS | Status: AC
  Administered 2012-10-01: 1000 mL via INTRAVENOUS

## 2012-10-01 MED ORDER — SUCCINYLCHOLINE CHLORIDE 20 MG/ML IJ SOLN
INTRAMUSCULAR | Status: AC
  Filled 2012-10-01: qty 1

## 2012-10-01 MED ORDER — LIDOCAINE HCL (CARDIAC) 20 MG/ML IV SOLN
INTRAVENOUS | Status: AC
  Filled 2012-10-01: qty 5
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
## 2012-10-01 NOTE — ED Notes (Signed)
Pt was at [facility] heart and vascular dr office signing in to be seen for right leg pain when he began to feel like he was going to pass out. Pt did fall to floor, hit his chin on counter at desk, hit head on the floor when he fell backwards, pt admits to loc, staff at [place] reported that pt did not experience any LOC. bp 64/50, hr 52. On arrival to er pt alert, able to answer all questions, pt c/o pain to chin area.

## 2012-11-20 ENCOUNTER — Encounter: Payer: Self-pay | Admitting: *Deleted

## 2012-11-22 ENCOUNTER — Encounter: Payer: Self-pay | Admitting: Internal Medicine

## 2012-11-22 ENCOUNTER — Ambulatory Visit (INDEPENDENT_AMBULATORY_CARE_PROVIDER_SITE_OTHER): Payer: Medicaid Other | Admitting: Internal Medicine

## 2012-11-22 VITALS — BP 100/70 | HR 65 | Ht 70.0 in | Wt 126.0 lb

## 2012-11-22 DIAGNOSIS — I10 Essential (primary) hypertension: Secondary | ICD-10-CM | POA: Insufficient documentation

## 2012-11-22 DIAGNOSIS — N184 Chronic kidney disease, stage 4 (severe): Secondary | ICD-10-CM

## 2012-11-22 DIAGNOSIS — I739 Peripheral vascular disease, unspecified: Secondary | ICD-10-CM

## 2012-11-22 DIAGNOSIS — J449 Chronic obstructive pulmonary disease, unspecified: Secondary | ICD-10-CM | POA: Insufficient documentation
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
## 2012-11-22 NOTE — Patient Instructions (Signed)
Your physician recommends that you continue on your current medications as directed. Please refer to the Current Medication list given to you today.  Call or return to clinic prn if these symptoms worsen or fail to improve as anticipated.  

## 2013-01-17 ENCOUNTER — Other Ambulatory Visit: Payer: Self-pay | Admitting: *Deleted

## 2013-01-17 DIAGNOSIS — Z01810 Encounter for preprocedural cardiovascular examination: Secondary | ICD-10-CM

## 2013-01-17 DIAGNOSIS — N185 Chronic kidney disease, stage 5: Secondary | ICD-10-CM

## 2013-01-27 ENCOUNTER — Encounter: Payer: Self-pay | Admitting: Vascular Surgery

## 2013-01-28 ENCOUNTER — Encounter: Payer: Self-pay | Admitting: Vascular Surgery

## 2013-01-28 ENCOUNTER — Other Ambulatory Visit: Payer: Self-pay

## 2013-01-28 ENCOUNTER — Encounter (INDEPENDENT_AMBULATORY_CARE_PROVIDER_SITE_OTHER): Payer: Medicaid Other | Admitting: *Deleted

## 2013-01-28 ENCOUNTER — Ambulatory Visit (INDEPENDENT_AMBULATORY_CARE_PROVIDER_SITE_OTHER): Payer: Medicaid Other | Admitting: Vascular Surgery

## 2013-01-28 VITALS — BP 115/77 | HR 82 | Resp 16 | Ht 70.0 in | Wt 124.0 lb

## 2013-01-28 DIAGNOSIS — Z01810 Encounter for preprocedural cardiovascular examination: Secondary | ICD-10-CM

## 2013-01-28 DIAGNOSIS — N186 End stage renal disease: Secondary | ICD-10-CM

## 2013-01-28 DIAGNOSIS — N185 Chronic kidney disease, stage 5: Secondary | ICD-10-CM

## 2013-01-28 NOTE — Progress Notes (Signed)
The patient presents today for discussion of AV access. He has a several year history of progressive chronic renal insufficiency and has been recommended that he undergo hemodialysis access placement. He is here today with his mother. He has had no treatments for hemodialysis in the past. He is quite hesitant to consider hemodialysis and has declined offers for placement of access in the past.  Past Medical History  Diagnosis Date  . Hypertension   . COPD (chronic obstructive pulmonary disease)   . Chronic kidney disease   . Peripheral vascular disease   . Asthma   . Irregular heartbeat     History  Substance Use Topics  . Smoking status: Current Every Day Smoker -- 0.50 packs/day for 20 years    Types: Cigarettes  . Smokeless tobacco: Not on file  . Alcohol Use: 14.4 oz/week    24 Cans of beer per week     Comment: four to five beers weekly    Family History  Problem Relation Age of Onset  . Cancer Father 30    stomach  . Hypertension Mother     Allergies  Allergen Reactions  . Lotrel (Amlodipine Besy-Benazepril Hcl) Swelling    Lips swelling    Current outpatient prescriptions:albuterol (PROVENTIL HFA;VENTOLIN HFA) 108 (90 BASE) MCG/ACT inhaler, Inhale 2 puffs into the lungs every 6 (six) hours as needed for wheezing or shortness of breath. , Disp: , Rfl: ;  amLODipine (NORVASC) 10 MG tablet, Take 10 mg by mouth daily., Disp: , Rfl: ;  budesonide-formoterol (SYMBICORT) 160-4.5 MCG/ACT inhaler, Inhale 2 puffs into the lungs 2 (two) times daily., Disp: , Rfl:  calcitRIOL (ROCALTROL) 0.25 MCG capsule, Take 0.25 mcg by mouth daily., Disp: , Rfl: ;  carvedilol (COREG) 12.5 MG tablet, Take 12.5 mg by mouth 2 (two) times daily with a meal., Disp: , Rfl: ;  clopidogrel (PLAVIX) 75 MG tablet, Take 75 mg by mouth daily., Disp: , Rfl: ;  cyclobenzaprine (FLEXERIL) 10 MG tablet, Take 10 mg by mouth 3 (three) times daily as needed for muscle spasms. , Disp: , Rfl:  IRON PO, Take 1 tablet by  mouth daily., Disp: , Rfl: ;  Multiple Vitamin (MULTIVITAMIN WITH MINERALS) TABS, Take 1 tablet by mouth daily., Disp: , Rfl: ;  sodium bicarbonate 650 MG tablet, Take 650 mg by mouth daily. , Disp: , Rfl: ;  traMADol (ULTRAM) 50 MG tablet, Take 50 mg by mouth every 6 (six) hours as needed for pain. , Disp: , Rfl: ;  vitamin C (ASCORBIC ACID) 500 MG tablet, Take 500 mg by mouth daily., Disp: , Rfl:  megestrol (MEGACE) 40 MG/ML suspension, Take one tablespoonful (15 ml) by mouth daily, Disp: , Rfl:   BP 115/77  Pulse 82  Resp 16  Ht 5\' 10"  (1.778 m)  Wt 124 lb (56.246 kg)  BMI 17.79 kg/m2  SpO2 97%  Body mass index is 17.79 kg/(m^2).       Physical exam a thin gentleman in no acute distress 2+ radial pulses bilaterally Skin without ulcers or rashes Neurologically he is grossly intact Respirations equal and nonlabored Very superficial surface veins bilaterally. Small to moderate size in the cephalic vein in the forearm  Venous mapping of his bilateral cephalic and basilic veins reveal better caliber on the right than the left. This ranges from 0.16 2.3 on the right.  Impression and plan insufficiency and need for hemodialysis access. I discussed options to include hemodialysis catheter, AV fistula an AV graft. Although his  veins are rather small by duplex they are very superficial and appear to be the tight fit very well may dilate with arterial pressure. I have recommended a right Cimino AV fistula creation as his initial attempt at a hemodialysis. I did explain potential for non-maturation of the fistula. He had reports that he is willing to consider this but will not consent to surgery before one month. We have tentatively scheduled him for right Cimino AV fistula as an outpatient at [facility] on 01/24/2013

## 2013-01-29 ENCOUNTER — Encounter: Payer: Self-pay | Admitting: Nephrology

## 2013-02-10 ENCOUNTER — Encounter (HOSPITAL_COMMUNITY): Payer: Self-pay

## 2013-02-22 NOTE — Progress Notes (Signed)
Attempted to contact number listed x 2. Both times call went to VM when I started talking I could here the receiver being picked up with background noise then the phone got hung up.

## 2013-02-23 MED ORDER — DEXTROSE 5 % IV SOLN
1.5000 g | INTRAVENOUS | Status: AC
  Administered 2013-02-24: 1.5 g via INTRAVENOUS
  Filled 2013-02-23: qty 1.5

## 2013-02-24 ENCOUNTER — Encounter (HOSPITAL_COMMUNITY): Payer: Self-pay | Admitting: *Deleted

## 2013-02-24 ENCOUNTER — Ambulatory Visit (HOSPITAL_COMMUNITY)
Admission: RE | Admit: 2013-02-24 | Discharge: 2013-02-24 | Disposition: A | Discharge status: Home / Self Care | Payer: Medicaid Other | Source: Ambulatory Visit | Attending: Vascular Surgery | Admitting: Vascular Surgery

## 2013-02-24 ENCOUNTER — Ambulatory Visit (HOSPITAL_COMMUNITY): Payer: Medicaid Other | Admitting: Anesthesiology

## 2013-02-24 ENCOUNTER — Encounter (HOSPITAL_COMMUNITY)
Admission: RE | Disposition: A | Discharge status: Home / Self Care | Payer: Self-pay | Source: Ambulatory Visit | Attending: Vascular Surgery

## 2013-02-24 ENCOUNTER — Encounter (HOSPITAL_COMMUNITY): Payer: Self-pay | Admitting: Anesthesiology

## 2013-02-24 DIAGNOSIS — N184 Chronic kidney disease, stage 4 (severe): Secondary | ICD-10-CM

## 2013-02-24 DIAGNOSIS — I120 Hypertensive chronic kidney disease with stage 5 chronic kidney disease or end stage renal disease: Secondary | ICD-10-CM | POA: Insufficient documentation

## 2013-02-24 DIAGNOSIS — Z888 Allergy status to other drugs, medicaments and biological substances status: Secondary | ICD-10-CM | POA: Insufficient documentation

## 2013-02-24 DIAGNOSIS — J4489 Other specified chronic obstructive pulmonary disease: Secondary | ICD-10-CM | POA: Insufficient documentation

## 2013-02-24 DIAGNOSIS — N186 End stage renal disease: Secondary | ICD-10-CM | POA: Insufficient documentation

## 2013-02-24 DIAGNOSIS — F17200 Nicotine dependence, unspecified, uncomplicated: Secondary | ICD-10-CM | POA: Insufficient documentation

## 2013-02-24 DIAGNOSIS — Z7902 Long term (current) use of antithrombotics/antiplatelets: Secondary | ICD-10-CM | POA: Insufficient documentation

## 2013-02-24 DIAGNOSIS — I739 Peripheral vascular disease, unspecified: Secondary | ICD-10-CM | POA: Insufficient documentation

## 2013-02-24 DIAGNOSIS — J449 Chronic obstructive pulmonary disease, unspecified: Secondary | ICD-10-CM | POA: Insufficient documentation

## 2013-02-24 DIAGNOSIS — I499 Cardiac arrhythmia, unspecified: Secondary | ICD-10-CM | POA: Insufficient documentation

## 2013-02-24 HISTORY — PX: AV FISTULA PLACEMENT: SHX1204

## 2013-02-24 LAB — POCT I-STAT 4, (NA,K, GLUC, HGB,HCT): Glucose, Bld: 104 mg/dL — ABNORMAL HIGH (ref 70–99)

## 2013-02-24 SURGERY — ARTERIOVENOUS (AV) FISTULA CREATION
Anesthesia: General | Site: Arm Lower | Laterality: Right | Wound class: Clean

## 2013-02-24 MED ORDER — PHENYLEPHRINE HCL 10 MG/ML IJ SOLN
INTRAMUSCULAR | Status: DC | PRN
  Administered 2013-02-24 (×3): 40 ug via INTRAVENOUS

## 2013-02-24 MED ORDER — SODIUM CHLORIDE 0.9 % IR SOLN
Status: DC | PRN
  Administered 2013-02-24: 11:00:00

## 2013-02-24 MED ORDER — OXYCODONE-ACETAMINOPHEN 5-325 MG PO TABS: 1.0000 | ORAL_TABLET | ORAL | Status: DC | PRN

## 2013-02-24 MED ORDER — PROPOFOL INFUSION 10 MG/ML OPTIME
INTRAVENOUS | Status: DC | PRN
  Administered 2013-02-24: 75 ug/kg/min via INTRAVENOUS

## 2013-02-24 MED ORDER — 0.9 % SODIUM CHLORIDE (POUR BTL) OPTIME
TOPICAL | Status: DC | PRN
  Administered 2013-02-24: 1000 mL

## 2013-02-24 MED ORDER — ONDANSETRON HCL 4 MG/2ML IJ SOLN: 4.0000 mg | Freq: Once | INTRAMUSCULAR | Status: DC | PRN

## 2013-02-24 MED ORDER — MENTHOL 3 MG MT LOZG
LOZENGE | OROMUCOSAL | Status: AC
  Filled 2013-02-24: qty 9

## 2013-02-24 MED ORDER — CARVEDILOL 12.5 MG PO TABS
12.5000 mg | ORAL_TABLET | Freq: Once | ORAL | Status: AC
  Administered 2013-02-24: 12.5 mg via ORAL
  Filled 2013-02-24 (×2): qty 1

## 2013-02-24 MED ORDER — FENTANYL CITRATE 0.05 MG/ML IJ SOLN
INTRAMUSCULAR | Status: DC | PRN
  Administered 2013-02-24 (×2): 50 ug via INTRAVENOUS

## 2013-02-24 MED ORDER — LIDOCAINE HCL (CARDIAC) 20 MG/ML IV SOLN
INTRAVENOUS | Status: DC | PRN
  Administered 2013-02-24: 60 mg via INTRAVENOUS

## 2013-02-24 MED ORDER — SODIUM CHLORIDE 0.9 % IV SOLN
INTRAVENOUS | Status: DC
  Administered 2013-02-24: 10:00:00 via INTRAVENOUS

## 2013-02-24 MED ORDER — MIDAZOLAM HCL 5 MG/5ML IJ SOLN
INTRAMUSCULAR | Status: DC | PRN
  Administered 2013-02-24: 2 mg via INTRAVENOUS

## 2013-02-24 MED ORDER — LIDOCAINE-EPINEPHRINE 0.5 %-1:200000 IJ SOLN
INTRAMUSCULAR | Status: AC
  Filled 2013-02-24: qty 1

## 2013-02-24 MED ORDER — LIDOCAINE-EPINEPHRINE 0.5 %-1:200000 IJ SOLN
INTRAMUSCULAR | Status: DC | PRN
  Administered 2013-02-24: 50 mL

## 2013-02-24 MED ORDER — SODIUM CHLORIDE 0.9 % IV SOLN: INTRAVENOUS | Status: DC

## 2013-02-24 MED ORDER — PROPOFOL 10 MG/ML IV BOLUS
INTRAVENOUS | Status: DC | PRN
  Administered 2013-02-24: 120 mg via INTRAVENOUS

## 2013-02-24 MED ORDER — HYDROMORPHONE HCL PF 1 MG/ML IJ SOLN: 0.2500 mg | INTRAMUSCULAR | Status: DC | PRN

## 2013-02-24 MED ORDER — ALBUMIN HUMAN 5 % IV SOLN
INTRAVENOUS | Status: DC | PRN
  Administered 2013-02-24: 11:00:00 via INTRAVENOUS

## 2013-02-24 SURGICAL SUPPLY — 38 items
APL SKNCLS STERI-STRIP NONHPOA (GAUZE/BANDAGES/DRESSINGS) ×1
ARMBAND PINK RESTRICT EXTREMIT (MISCELLANEOUS) ×2 IMPLANT
BENZOIN TINCTURE PRP APPL 2/3 (GAUZE/BANDAGES/DRESSINGS) ×2 IMPLANT
BLADE SURG 10 STRL SS (BLADE) ×1 IMPLANT
CANISTER SUCTION 2500CC (MISCELLANEOUS) ×2 IMPLANT
CLIP LIGATING EXTRA MED SLVR (CLIP) ×2 IMPLANT
CLIP LIGATING EXTRA SM BLUE (MISCELLANEOUS) ×2 IMPLANT
CLOTH BEACON ORANGE TIMEOUT ST (SAFETY) ×2 IMPLANT
COVER PROBE W GEL 5X96 (DRAPES) ×2 IMPLANT
COVER SURGICAL LIGHT HANDLE (MISCELLANEOUS) ×2 IMPLANT
DECANTER SPIKE VIAL GLASS SM (MISCELLANEOUS) ×2 IMPLANT
ELECT REM PT RETURN 9FT ADLT (ELECTROSURGICAL) ×2
ELECTRODE REM PT RTRN 9FT ADLT (ELECTROSURGICAL) ×1 IMPLANT
GEL ULTRASOUND 20GR AQUASONIC (MISCELLANEOUS) IMPLANT
GLOVE BIOGEL PI IND STRL 6.5 (GLOVE) IMPLANT
GLOVE BIOGEL PI IND STRL 7.0 (GLOVE) IMPLANT
GLOVE BIOGEL PI INDICATOR 6.5 (GLOVE) ×1
GLOVE BIOGEL PI INDICATOR 7.0 (GLOVE) ×1
GLOVE ECLIPSE 6.5 STRL STRAW (GLOVE) ×1 IMPLANT
GLOVE SS BIOGEL STRL SZ 7.5 (GLOVE) ×1 IMPLANT
GLOVE SUPERSENSE BIOGEL SZ 7.5 (GLOVE) ×1
GOWN STRL NON-REIN LRG LVL3 (GOWN DISPOSABLE) ×4 IMPLANT
KIT BASIN OR (CUSTOM PROCEDURE TRAY) ×2 IMPLANT
KIT ROOM TURNOVER OR (KITS) ×2 IMPLANT
NDL HYPO 25GX1X1/2 BEV (NEEDLE) ×1 IMPLANT
NEEDLE HYPO 25GX1X1/2 BEV (NEEDLE) ×2 IMPLANT
NS IRRIG 1000ML POUR BTL (IV SOLUTION) ×2 IMPLANT
PACK CV ACCESS (CUSTOM PROCEDURE TRAY) ×2 IMPLANT
PAD ARMBOARD 7.5X6 YLW CONV (MISCELLANEOUS) ×4 IMPLANT
SPONGE GAUZE 4X4 12PLY (GAUZE/BANDAGES/DRESSINGS) ×1 IMPLANT
STRIP CLOSURE SKIN 1/2X4 (GAUZE/BANDAGES/DRESSINGS) ×2 IMPLANT
SUT PROLENE 6 0 CC (SUTURE) ×2 IMPLANT
SUT VIC AB 3-0 SH 27 (SUTURE) ×2
SUT VIC AB 3-0 SH 27X BRD (SUTURE) ×1 IMPLANT
TOWEL OR 17X24 6PK STRL BLUE (TOWEL DISPOSABLE) ×2 IMPLANT
TOWEL OR 17X26 10 PK STRL BLUE (TOWEL DISPOSABLE) ×2 IMPLANT
UNDERPAD 30X30 INCONTINENT (UNDERPADS AND DIAPERS) ×2 IMPLANT
WATER STERILE IRR 1000ML POUR (IV SOLUTION) ×2 IMPLANT

## 2013-02-24 NOTE — H&P (View-Only) (Signed)
The patient presents today for discussion of AV access. He has a several year history of progressive chronic renal insufficiency and has been recommended that he undergo hemodialysis access placement. He is here today with his mother. He has had no treatments for hemodialysis in the past. He is quite hesitant to consider hemodialysis and has declined offers for placement of access in the past.  Past Medical History  Diagnosis Date  . Hypertension   . COPD (chronic obstructive pulmonary disease)   . Chronic kidney disease   . Peripheral vascular disease   . Asthma   . Irregular heartbeat     History  Substance Use Topics  . Smoking status: Current Every Day Smoker -- 0.50 packs/day for 20 years    Types: Cigarettes  . Smokeless tobacco: Not on file  . Alcohol Use: 14.4 oz/week    24 Cans of beer per week     Comment: four to five beers weekly    Family History  Problem Relation Age of Onset  . Cancer Father 30    stomach  . Hypertension Mother     Allergies  Allergen Reactions  . Lotrel (Amlodipine Besy-Benazepril Hcl) Swelling    Lips swelling    Current outpatient prescriptions:albuterol (PROVENTIL HFA;VENTOLIN HFA) 108 (90 BASE) MCG/ACT inhaler, Inhale 2 puffs into the lungs every 6 (six) hours as needed for wheezing or shortness of breath. , Disp: , Rfl: ;  amLODipine (NORVASC) 10 MG tablet, Take 10 mg by mouth daily., Disp: , Rfl: ;  budesonide-formoterol (SYMBICORT) 160-4.5 MCG/ACT inhaler, Inhale 2 puffs into the lungs 2 (two) times daily., Disp: , Rfl:  calcitRIOL (ROCALTROL) 0.25 MCG capsule, Take 0.25 mcg by mouth daily., Disp: , Rfl: ;  carvedilol (COREG) 12.5 MG tablet, Take 12.5 mg by mouth 2 (two) times daily with a meal., Disp: , Rfl: ;  clopidogrel (PLAVIX) 75 MG tablet, Take 75 mg by mouth daily., Disp: , Rfl: ;  cyclobenzaprine (FLEXERIL) 10 MG tablet, Take 10 mg by mouth 3 (three) times daily as needed for muscle spasms. , Disp: , Rfl:  IRON PO, Take 1 tablet by  mouth daily., Disp: , Rfl: ;  Multiple Vitamin (MULTIVITAMIN WITH MINERALS) TABS, Take 1 tablet by mouth daily., Disp: , Rfl: ;  sodium bicarbonate 650 MG tablet, Take 650 mg by mouth daily. , Disp: , Rfl: ;  traMADol (ULTRAM) 50 MG tablet, Take 50 mg by mouth every 6 (six) hours as needed for pain. , Disp: , Rfl: ;  vitamin C (ASCORBIC ACID) 500 MG tablet, Take 500 mg by mouth daily., Disp: , Rfl:  megestrol (MEGACE) 40 MG/ML suspension, Take one tablespoonful (15 ml) by mouth daily, Disp: , Rfl:   BP 115/77  Pulse 82  Resp 16  Ht 5\' 10"  (1.778 m)  Wt 124 lb (56.246 kg)  BMI 17.79 kg/m2  SpO2 97%  Body mass index is 17.79 kg/(m^2).       Physical exam a thin gentleman in no acute distress 2+ radial pulses bilaterally Skin without ulcers or rashes Neurologically he is grossly intact Respirations equal and nonlabored Very superficial surface veins bilaterally. Small to moderate size in the cephalic vein in the forearm  Venous mapping of his bilateral cephalic and basilic veins reveal better caliber on the right than the left. This ranges from 0.16 2.3 on the right.  Impression and plan insufficiency and need for hemodialysis access. I discussed options to include hemodialysis catheter, AV fistula an AV graft. Although his  veins are rather small by duplex they are very superficial and appear to be the tight fit very well may dilate with arterial pressure. I have recommended a right Cimino AV fistula creation as his initial attempt at a hemodialysis. I did explain potential for non-maturation of the fistula. He had reports that he is willing to consider this but will not consent to surgery before one month. We have tentatively scheduled him for right Cimino AV fistula as an outpatient at [facility] on 01/24/2013

## 2013-02-24 NOTE — Preoperative (Signed)
Beta Blockers   Reason not to administer Beta Blockers:Not Applicable 

## 2013-02-24 NOTE — Transfer of Care (Signed)
Immediate Anesthesia Transfer of Care Note  Patient: Luke Mueller  Procedure(s) Performed: Procedure(s): CIMINO ARTERIOVENOUS (AV) FISTULA CREATION  (Right)  Patient Location: PACU  Anesthesia Type:General  Level of Consciousness: awake, alert  and oriented  Airway & Oxygen Therapy: Patient connected to nasal cannula oxygen  Post-op Assessment: Report given to PACU RN  Post vital signs: stable  Complications: No apparent anesthesia complications

## 2013-02-24 NOTE — Interval H&P Note (Signed)
History and Physical Interval Note:  02/24/2013 9:40 AM  Luke Mueller  has presented today for surgery, with the diagnosis of End Stage Renal Disease  The various methods of treatment have been discussed with the patient and family. After consideration of risks, benefits and other options for treatment, the patient has consented to  Procedure(s): ARTERIOVENOUS (AV) FISTULA CREATION - RIGHT CIMINO (Right) as a surgical intervention .  The patient's history has been reviewed, patient examined, no change in status, stable for surgery.  I have reviewed the patient's chart and labs.  Questions were answered to the patient's satisfaction.     Christal Lagerstrom

## 2013-02-24 NOTE — Op Note (Signed)
OPERATIVE REPORT  DATE OF SURGERY: 02/24/2013  PATIENT: Luke Mueller, 52 y.o. male MRN: [id number]  DOB: 06-Mar-1961  PRE-OPERATIVE DIAGNOSIS: CRI  POST-OPERATIVE DIAGNOSIS:  Same  PROCEDURE: Right Cimino av fistula  SURGEON:  Curt Jews, M.D.  PHYSICIAN ASSISTANT: nurse  ANESTHESIA:  LMA  EBL: Minimal ml  Total I/O In: 550 [I.V.:300; IV Piggyback:250] Out: -   BLOOD ADMINISTERED: None  DRAINS: None  SPECIMEN: None  COUNTS CORRECT:  YES  PLAN OF CARE: PACU   PATIENT DISPOSITION:  PACU - hemodynamically stable  PROCEDURE DETAILS: The patient was taken to the operating room and placed supine position where the area of the right arm was prepped and draped in the usual sterile fashion. Using local anesthesia incision was made between the level of the cephalic vein and the radial artery at the wrist. The vein was of good caliber and tributary branches were ligated with 301 4-0 silk ties and divided. The vein was ligated distally and was divided. This was mobilized to the level of the radial artery. The radial artery was also of good caliber. The artery was occluded proximally and distally and was opened with an 11 blade and extended longitudinally with Potts scissors. The vein was cut to appropriate length was spatulated and sewn end-to-side to the artery with a running 6-0 Prolene suture. Clamps were removed and excellent thrill was noted. The wounds were irrigated with saline. Hemostasis was obtained electrocautery. The wounds were closed with 3-0 Vicryl in the subcutaneous and subcuticular tissue. Benzoin and Steri-Strips were applied. The patient was transferred to the recovery room in stable condition   Curt Jews, M.D. 02/24/2013 11:50 AM

## 2013-02-24 NOTE — Anesthesia Postprocedure Evaluation (Signed)
  Anesthesia Post-op Note  Patient: Luke Mueller  Procedure(s) Performed: Procedure(s): CIMINO ARTERIOVENOUS (AV) FISTULA CREATION  (Right)  Patient Location: PACU  Anesthesia Type:General  Level of Consciousness: awake, alert  and oriented  Airway and Oxygen Therapy: Patient Spontanous Breathing  Post-op Pain: mild  Post-op Assessment: Post-op Vital signs reviewed, Patient's Cardiovascular Status Stable, Respiratory Function Stable, Patent Airway and Pain level controlled  Post-op Vital Signs: stable  Complications: No apparent anesthesia complications

## 2013-02-24 NOTE — Anesthesia Preprocedure Evaluation (Addendum)
Anesthesia Evaluation  Patient identified by MRN, date of birth, ID band Patient awake    Reviewed: Allergy & Precautions, H&P , NPO status , Patient's Chart, lab work & pertinent test results, reviewed documented beta blocker date and time   Airway Mallampati: I TM Distance: >3 FB     Dental  (+) Teeth Intact, Dental Advisory Given and Poor Dentition   Pulmonary asthma , COPD COPD inhaler,    + wheezing      Cardiovascular hypertension, Pt. on medications and Pt. on home beta blockers + Peripheral Vascular Disease Rhythm:Regular Rate:Normal     Neuro/Psych    GI/Hepatic negative GI ROS, Neg liver ROS,   Endo/Other  negative endocrine ROS  Renal/GU CRFRenal disease     Musculoskeletal negative musculoskeletal ROS (+)   Abdominal (+)  Abdomen: soft. Bowel sounds: normal.  Peds  Hematology negative hematology ROS (+)   Anesthesia Other Findings Pt on plavix, last dose 02/23/2013.  Coreg today in holding room.    Reproductive/Obstetrics negative OB ROS                       Anesthesia Physical Anesthesia Plan  ASA: III  Anesthesia Plan: MAC   Post-op Pain Management:    Induction: Intravenous  Airway Management Planned: Natural Airway and Simple Face Mask  Additional Equipment:   Intra-op Plan:   Post-operative Plan: Extubation in OR  Informed Consent: I have reviewed the patients History and Physical, chart, labs and discussed the procedure including the risks, benefits and alternatives for the proposed anesthesia with the patient or authorized representative who has indicated his/her understanding and acceptance.     Plan Discussed with: CRNA and Anesthesiologist  Anesthesia Plan Comments:         Anesthesia Quick Evaluation

## 2013-02-24 NOTE — Anesthesia Procedure Notes (Signed)
Procedure Name: LMA Insertion Date/Time: 02/24/2013 10:32 AM Performed by: Maeola Harman Pre-anesthesia Checklist: Patient identified, Emergency Drugs available, Suction available, Patient being monitored and Timeout performed Patient Re-evaluated:Patient Re-evaluated prior to inductionOxygen Delivery Method: Circle system utilized Preoxygenation: Pre-oxygenation with 100% oxygen Intubation Type: IV induction Ventilation: Mask ventilation without difficulty LMA: LMA with gastric port inserted LMA Size: 4.0 Number of attempts: 1 Placement Confirmation: breath sounds checked- equal and bilateral and positive ETCO2 Tube secured with: Tape Dental Injury: Teeth and Oropharynx as per pre-operative assessment

## 2013-02-25 ENCOUNTER — Encounter (HOSPITAL_COMMUNITY): Payer: Self-pay | Admitting: Vascular Surgery

## 2013-02-25 ENCOUNTER — Telehealth: Payer: Self-pay | Admitting: Vascular Surgery

## 2013-02-25 NOTE — Telephone Encounter (Signed)
Message copied by Georgiann Mccoy on Tue Feb 25, 2013  9:56 AM ------      Message from: Alfonso Patten      Created: Mon Feb 24, 2013 12:03 PM                   ----- Message -----         From: Rosetta Posner, MD         Sent: 02/24/2013  11:54 AM           To: Patrici Ranks, Alfonso Patten, RN            Right Cimino AV fistula. Tourist information centre manager. Needs office visit in one month. No vascular lab needed. ------

## 2013-03-13 ENCOUNTER — Ambulatory Visit (HOSPITAL_COMMUNITY)
Admission: RE | Admit: 2013-03-13 | Discharge: 2013-03-13 | Disposition: A | Discharge status: Home / Self Care | Payer: Medicaid Other | Source: Ambulatory Visit | Attending: Pulmonary Disease | Admitting: Pulmonary Disease

## 2013-03-13 ENCOUNTER — Other Ambulatory Visit (HOSPITAL_COMMUNITY): Payer: Self-pay | Admitting: Pulmonary Disease

## 2013-03-13 DIAGNOSIS — R059 Cough, unspecified: Secondary | ICD-10-CM | POA: Insufficient documentation

## 2013-03-13 DIAGNOSIS — F17200 Nicotine dependence, unspecified, uncomplicated: Secondary | ICD-10-CM | POA: Insufficient documentation

## 2013-03-13 DIAGNOSIS — J449 Chronic obstructive pulmonary disease, unspecified: Secondary | ICD-10-CM

## 2013-03-13 DIAGNOSIS — R05 Cough: Secondary | ICD-10-CM | POA: Insufficient documentation

## 2013-03-13 DIAGNOSIS — J4489 Other specified chronic obstructive pulmonary disease: Secondary | ICD-10-CM | POA: Insufficient documentation

## 2013-03-31 ENCOUNTER — Encounter: Payer: Self-pay | Admitting: Vascular Surgery

## 2013-04-01 ENCOUNTER — Ambulatory Visit: Payer: Medicaid Other | Admitting: Vascular Surgery

## 2013-04-07 ENCOUNTER — Encounter: Payer: Self-pay | Admitting: Vascular Surgery

## 2013-04-08 ENCOUNTER — Ambulatory Visit (INDEPENDENT_AMBULATORY_CARE_PROVIDER_SITE_OTHER): Payer: Self-pay | Admitting: Vascular Surgery

## 2013-04-08 ENCOUNTER — Encounter: Payer: Self-pay | Admitting: Vascular Surgery

## 2013-04-08 VITALS — BP 138/92 | HR 79 | Resp 18 | Ht 70.0 in | Wt 124.7 lb

## 2013-04-08 DIAGNOSIS — N186 End stage renal disease: Secondary | ICD-10-CM | POA: Insufficient documentation

## 2013-04-08 NOTE — Progress Notes (Signed)
Patient has today for followup of his right Cimino AV fistula by myself on 02/24/2013. He has had complete healing of his wrist incision and has excellent Jenniferann Stuckert maturation. The vein is quite dilated it is very straight and very superficial with an excellent thrill. He is here today with his mother. He has multiple questions regarding the need for dialysis different options for dialysis timing for dialysis and longevity of dialysis. I have asked that he discuss this with his nephrologist. He also has relatively clear cut right leg claudication symptoms. He does not have any rest pain or tissue loss. X-ray reinserting not recommend anything other than walking program and is currently under taking that due to the fact of high risk for worsening of his renal insufficiency with any interventional study. Patient understands and will see [place] again on an as-needed basis

## 2013-09-25 ENCOUNTER — Other Ambulatory Visit (HOSPITAL_COMMUNITY): Payer: Self-pay | Admitting: Pulmonary Disease

## 2013-09-25 ENCOUNTER — Ambulatory Visit (HOSPITAL_COMMUNITY)
Admission: RE | Admit: 2013-09-25 | Discharge: 2013-09-25 | Disposition: A | Discharge status: Home / Self Care | Payer: Medicaid Other | Source: Ambulatory Visit | Attending: Pulmonary Disease | Admitting: Pulmonary Disease

## 2013-09-25 DIAGNOSIS — J449 Chronic obstructive pulmonary disease, unspecified: Secondary | ICD-10-CM | POA: Insufficient documentation

## 2013-09-25 DIAGNOSIS — J4489 Other specified chronic obstructive pulmonary disease: Secondary | ICD-10-CM | POA: Insufficient documentation

## 2014-02-16 ENCOUNTER — Other Ambulatory Visit: Payer: Self-pay | Admitting: Vascular Surgery

## 2014-02-18 ENCOUNTER — Telehealth: Payer: Self-pay | Admitting: *Deleted

## 2014-02-18 NOTE — Telephone Encounter (Signed)
Rx refill received for Plavix. Patient has not been seen here for Cardiology reasons. Patient was seen in 03/2013 by Dr.Hilty for PV. LMTCB and clarify who medication was prescribed by.

## 2014-04-03 ENCOUNTER — Other Ambulatory Visit (HOSPITAL_COMMUNITY): Payer: Self-pay | Admitting: Pulmonary Disease

## 2014-04-03 ENCOUNTER — Ambulatory Visit (HOSPITAL_COMMUNITY)
Admission: RE | Admit: 2014-04-03 | Discharge: 2014-04-03 | Disposition: A | Discharge status: Home / Self Care | Payer: Medicaid Other | Source: Ambulatory Visit | Attending: Pulmonary Disease | Admitting: Pulmonary Disease

## 2014-04-03 DIAGNOSIS — J42 Unspecified chronic bronchitis: Secondary | ICD-10-CM

## 2014-04-03 DIAGNOSIS — J4489 Other specified chronic obstructive pulmonary disease: Secondary | ICD-10-CM | POA: Insufficient documentation

## 2014-04-03 DIAGNOSIS — J449 Chronic obstructive pulmonary disease, unspecified: Secondary | ICD-10-CM | POA: Diagnosis present

## 2014-04-03 DIAGNOSIS — J40 Bronchitis, not specified as acute or chronic: Secondary | ICD-10-CM | POA: Insufficient documentation

## 2014-08-27 ENCOUNTER — Other Ambulatory Visit (HOSPITAL_COMMUNITY): Payer: Self-pay | Admitting: Pulmonary Disease

## 2014-08-27 ENCOUNTER — Ambulatory Visit (HOSPITAL_COMMUNITY)
Admission: RE | Admit: 2014-08-27 | Discharge: 2014-08-27 | Disposition: A | Discharge status: Home / Self Care | Payer: Medicaid Other | Source: Ambulatory Visit | Attending: Pulmonary Disease | Admitting: Pulmonary Disease

## 2014-08-27 DIAGNOSIS — I10 Essential (primary) hypertension: Secondary | ICD-10-CM | POA: Insufficient documentation

## 2014-08-27 DIAGNOSIS — J449 Chronic obstructive pulmonary disease, unspecified: Secondary | ICD-10-CM | POA: Diagnosis not present

## 2014-08-27 DIAGNOSIS — R05 Cough: Secondary | ICD-10-CM | POA: Insufficient documentation

## 2015-03-29 ENCOUNTER — Other Ambulatory Visit (HOSPITAL_COMMUNITY): Payer: Self-pay | Admitting: Pulmonary Disease

## 2015-03-29 ENCOUNTER — Ambulatory Visit (HOSPITAL_COMMUNITY)
Admission: RE | Admit: 2015-03-29 | Discharge: 2015-03-29 | Disposition: A | Discharge status: Home / Self Care | Payer: Medicaid Other | Source: Ambulatory Visit | Attending: Pulmonary Disease | Admitting: Pulmonary Disease

## 2015-03-29 DIAGNOSIS — J449 Chronic obstructive pulmonary disease, unspecified: Secondary | ICD-10-CM | POA: Diagnosis not present

## 2015-09-14 ENCOUNTER — Other Ambulatory Visit (HOSPITAL_COMMUNITY): Payer: Self-pay | Admitting: Pulmonary Disease

## 2015-09-14 ENCOUNTER — Ambulatory Visit (HOSPITAL_COMMUNITY)
Admission: RE | Admit: 2015-09-14 | Discharge: 2015-09-14 | Disposition: A | Discharge status: Home / Self Care | Payer: Medicaid Other | Source: Ambulatory Visit | Attending: Pulmonary Disease | Admitting: Pulmonary Disease

## 2015-09-14 DIAGNOSIS — J42 Unspecified chronic bronchitis: Secondary | ICD-10-CM

## 2015-09-14 DIAGNOSIS — J449 Chronic obstructive pulmonary disease, unspecified: Secondary | ICD-10-CM | POA: Diagnosis not present

## 2015-12-10 DIAGNOSIS — D509 Iron deficiency anemia, unspecified: Secondary | ICD-10-CM | POA: Diagnosis not present

## 2015-12-10 DIAGNOSIS — N186 End stage renal disease: Secondary | ICD-10-CM | POA: Diagnosis not present

## 2015-12-10 DIAGNOSIS — Z992 Dependence on renal dialysis: Secondary | ICD-10-CM | POA: Diagnosis not present

## 2015-12-10 DIAGNOSIS — D631 Anemia in chronic kidney disease: Secondary | ICD-10-CM | POA: Diagnosis not present

## 2015-12-13 DIAGNOSIS — D509 Iron deficiency anemia, unspecified: Secondary | ICD-10-CM | POA: Diagnosis not present

## 2015-12-13 DIAGNOSIS — Z992 Dependence on renal dialysis: Secondary | ICD-10-CM | POA: Diagnosis not present

## 2015-12-13 DIAGNOSIS — D631 Anemia in chronic kidney disease: Secondary | ICD-10-CM | POA: Diagnosis not present

## 2015-12-13 DIAGNOSIS — N186 End stage renal disease: Secondary | ICD-10-CM | POA: Diagnosis not present

## 2015-12-15 DIAGNOSIS — N186 End stage renal disease: Secondary | ICD-10-CM | POA: Diagnosis not present

## 2015-12-15 DIAGNOSIS — D509 Iron deficiency anemia, unspecified: Secondary | ICD-10-CM | POA: Diagnosis not present

## 2015-12-15 DIAGNOSIS — Z992 Dependence on renal dialysis: Secondary | ICD-10-CM | POA: Diagnosis not present

## 2015-12-15 DIAGNOSIS — D631 Anemia in chronic kidney disease: Secondary | ICD-10-CM | POA: Diagnosis not present

## 2015-12-17 DIAGNOSIS — Z992 Dependence on renal dialysis: Secondary | ICD-10-CM | POA: Diagnosis not present

## 2015-12-17 DIAGNOSIS — N186 End stage renal disease: Secondary | ICD-10-CM | POA: Diagnosis not present

## 2015-12-17 DIAGNOSIS — D509 Iron deficiency anemia, unspecified: Secondary | ICD-10-CM | POA: Diagnosis not present

## 2015-12-17 DIAGNOSIS — D631 Anemia in chronic kidney disease: Secondary | ICD-10-CM | POA: Diagnosis not present

## 2015-12-20 DIAGNOSIS — N186 End stage renal disease: Secondary | ICD-10-CM | POA: Diagnosis not present

## 2015-12-20 DIAGNOSIS — Z992 Dependence on renal dialysis: Secondary | ICD-10-CM | POA: Diagnosis not present

## 2015-12-20 DIAGNOSIS — D509 Iron deficiency anemia, unspecified: Secondary | ICD-10-CM | POA: Diagnosis not present

## 2015-12-20 DIAGNOSIS — D631 Anemia in chronic kidney disease: Secondary | ICD-10-CM | POA: Diagnosis not present

## 2015-12-22 DIAGNOSIS — Z992 Dependence on renal dialysis: Secondary | ICD-10-CM | POA: Diagnosis not present

## 2015-12-22 DIAGNOSIS — N186 End stage renal disease: Secondary | ICD-10-CM | POA: Diagnosis not present

## 2015-12-22 DIAGNOSIS — D509 Iron deficiency anemia, unspecified: Secondary | ICD-10-CM | POA: Diagnosis not present

## 2015-12-22 DIAGNOSIS — D631 Anemia in chronic kidney disease: Secondary | ICD-10-CM | POA: Diagnosis not present

## 2015-12-24 DIAGNOSIS — D509 Iron deficiency anemia, unspecified: Secondary | ICD-10-CM | POA: Diagnosis not present

## 2015-12-24 DIAGNOSIS — Z992 Dependence on renal dialysis: Secondary | ICD-10-CM | POA: Diagnosis not present

## 2015-12-24 DIAGNOSIS — D631 Anemia in chronic kidney disease: Secondary | ICD-10-CM | POA: Diagnosis not present

## 2015-12-24 DIAGNOSIS — N186 End stage renal disease: Secondary | ICD-10-CM | POA: Diagnosis not present

## 2015-12-27 DIAGNOSIS — Z992 Dependence on renal dialysis: Secondary | ICD-10-CM | POA: Diagnosis not present

## 2015-12-27 DIAGNOSIS — N186 End stage renal disease: Secondary | ICD-10-CM | POA: Diagnosis not present

## 2015-12-27 DIAGNOSIS — D631 Anemia in chronic kidney disease: Secondary | ICD-10-CM | POA: Diagnosis not present

## 2015-12-27 DIAGNOSIS — D509 Iron deficiency anemia, unspecified: Secondary | ICD-10-CM | POA: Diagnosis not present

## 2015-12-29 DIAGNOSIS — D509 Iron deficiency anemia, unspecified: Secondary | ICD-10-CM | POA: Diagnosis not present

## 2015-12-29 DIAGNOSIS — Z992 Dependence on renal dialysis: Secondary | ICD-10-CM | POA: Diagnosis not present

## 2015-12-29 DIAGNOSIS — N186 End stage renal disease: Secondary | ICD-10-CM | POA: Diagnosis not present

## 2015-12-29 DIAGNOSIS — D631 Anemia in chronic kidney disease: Secondary | ICD-10-CM | POA: Diagnosis not present

## 2015-12-31 DIAGNOSIS — N186 End stage renal disease: Secondary | ICD-10-CM | POA: Diagnosis not present

## 2015-12-31 DIAGNOSIS — D631 Anemia in chronic kidney disease: Secondary | ICD-10-CM | POA: Diagnosis not present

## 2015-12-31 DIAGNOSIS — D509 Iron deficiency anemia, unspecified: Secondary | ICD-10-CM | POA: Diagnosis not present

## 2015-12-31 DIAGNOSIS — Z992 Dependence on renal dialysis: Secondary | ICD-10-CM | POA: Diagnosis not present

## 2016-01-03 DIAGNOSIS — D509 Iron deficiency anemia, unspecified: Secondary | ICD-10-CM | POA: Diagnosis not present

## 2016-01-03 DIAGNOSIS — D631 Anemia in chronic kidney disease: Secondary | ICD-10-CM | POA: Diagnosis not present

## 2016-01-03 DIAGNOSIS — Z992 Dependence on renal dialysis: Secondary | ICD-10-CM | POA: Diagnosis not present

## 2016-01-03 DIAGNOSIS — N186 End stage renal disease: Secondary | ICD-10-CM | POA: Diagnosis not present

## 2016-01-05 DIAGNOSIS — N186 End stage renal disease: Secondary | ICD-10-CM | POA: Diagnosis not present

## 2016-01-05 DIAGNOSIS — Z992 Dependence on renal dialysis: Secondary | ICD-10-CM | POA: Diagnosis not present

## 2016-01-05 DIAGNOSIS — D509 Iron deficiency anemia, unspecified: Secondary | ICD-10-CM | POA: Diagnosis not present

## 2016-01-05 DIAGNOSIS — D631 Anemia in chronic kidney disease: Secondary | ICD-10-CM | POA: Diagnosis not present

## 2016-01-07 DIAGNOSIS — D509 Iron deficiency anemia, unspecified: Secondary | ICD-10-CM | POA: Diagnosis not present

## 2016-01-07 DIAGNOSIS — D631 Anemia in chronic kidney disease: Secondary | ICD-10-CM | POA: Diagnosis not present

## 2016-01-07 DIAGNOSIS — Z992 Dependence on renal dialysis: Secondary | ICD-10-CM | POA: Diagnosis not present

## 2016-01-07 DIAGNOSIS — N186 End stage renal disease: Secondary | ICD-10-CM | POA: Diagnosis not present

## 2016-01-10 DIAGNOSIS — Z992 Dependence on renal dialysis: Secondary | ICD-10-CM | POA: Diagnosis not present

## 2016-01-10 DIAGNOSIS — D509 Iron deficiency anemia, unspecified: Secondary | ICD-10-CM | POA: Diagnosis not present

## 2016-01-10 DIAGNOSIS — N186 End stage renal disease: Secondary | ICD-10-CM | POA: Diagnosis not present

## 2016-01-10 DIAGNOSIS — N2581 Secondary hyperparathyroidism of renal origin: Secondary | ICD-10-CM | POA: Diagnosis not present

## 2016-01-10 DIAGNOSIS — D631 Anemia in chronic kidney disease: Secondary | ICD-10-CM | POA: Diagnosis not present

## 2016-01-12 DIAGNOSIS — Z992 Dependence on renal dialysis: Secondary | ICD-10-CM | POA: Diagnosis not present

## 2016-01-12 DIAGNOSIS — N2581 Secondary hyperparathyroidism of renal origin: Secondary | ICD-10-CM | POA: Diagnosis not present

## 2016-01-12 DIAGNOSIS — D509 Iron deficiency anemia, unspecified: Secondary | ICD-10-CM | POA: Diagnosis not present

## 2016-01-12 DIAGNOSIS — D631 Anemia in chronic kidney disease: Secondary | ICD-10-CM | POA: Diagnosis not present

## 2016-01-12 DIAGNOSIS — N186 End stage renal disease: Secondary | ICD-10-CM | POA: Diagnosis not present

## 2016-01-14 DIAGNOSIS — D509 Iron deficiency anemia, unspecified: Secondary | ICD-10-CM | POA: Diagnosis not present

## 2016-01-14 DIAGNOSIS — Z992 Dependence on renal dialysis: Secondary | ICD-10-CM | POA: Diagnosis not present

## 2016-01-14 DIAGNOSIS — N2581 Secondary hyperparathyroidism of renal origin: Secondary | ICD-10-CM | POA: Diagnosis not present

## 2016-01-14 DIAGNOSIS — D631 Anemia in chronic kidney disease: Secondary | ICD-10-CM | POA: Diagnosis not present

## 2016-01-14 DIAGNOSIS — N186 End stage renal disease: Secondary | ICD-10-CM | POA: Diagnosis not present

## 2016-01-17 DIAGNOSIS — D631 Anemia in chronic kidney disease: Secondary | ICD-10-CM | POA: Diagnosis not present

## 2016-01-17 DIAGNOSIS — N2581 Secondary hyperparathyroidism of renal origin: Secondary | ICD-10-CM | POA: Diagnosis not present

## 2016-01-17 DIAGNOSIS — Z992 Dependence on renal dialysis: Secondary | ICD-10-CM | POA: Diagnosis not present

## 2016-01-17 DIAGNOSIS — D509 Iron deficiency anemia, unspecified: Secondary | ICD-10-CM | POA: Diagnosis not present

## 2016-01-17 DIAGNOSIS — N186 End stage renal disease: Secondary | ICD-10-CM | POA: Diagnosis not present

## 2016-01-19 DIAGNOSIS — D509 Iron deficiency anemia, unspecified: Secondary | ICD-10-CM | POA: Diagnosis not present

## 2016-01-19 DIAGNOSIS — N186 End stage renal disease: Secondary | ICD-10-CM | POA: Diagnosis not present

## 2016-01-19 DIAGNOSIS — N2581 Secondary hyperparathyroidism of renal origin: Secondary | ICD-10-CM | POA: Diagnosis not present

## 2016-01-19 DIAGNOSIS — Z992 Dependence on renal dialysis: Secondary | ICD-10-CM | POA: Diagnosis not present

## 2016-01-19 DIAGNOSIS — D631 Anemia in chronic kidney disease: Secondary | ICD-10-CM | POA: Diagnosis not present

## 2016-01-21 DIAGNOSIS — N186 End stage renal disease: Secondary | ICD-10-CM | POA: Diagnosis not present

## 2016-01-21 DIAGNOSIS — D509 Iron deficiency anemia, unspecified: Secondary | ICD-10-CM | POA: Diagnosis not present

## 2016-01-21 DIAGNOSIS — D631 Anemia in chronic kidney disease: Secondary | ICD-10-CM | POA: Diagnosis not present

## 2016-01-21 DIAGNOSIS — N2581 Secondary hyperparathyroidism of renal origin: Secondary | ICD-10-CM | POA: Diagnosis not present

## 2016-01-21 DIAGNOSIS — Z992 Dependence on renal dialysis: Secondary | ICD-10-CM | POA: Diagnosis not present

## 2016-01-24 DIAGNOSIS — N2581 Secondary hyperparathyroidism of renal origin: Secondary | ICD-10-CM | POA: Diagnosis not present

## 2016-01-24 DIAGNOSIS — D509 Iron deficiency anemia, unspecified: Secondary | ICD-10-CM | POA: Diagnosis not present

## 2016-01-24 DIAGNOSIS — D631 Anemia in chronic kidney disease: Secondary | ICD-10-CM | POA: Diagnosis not present

## 2016-01-24 DIAGNOSIS — Z992 Dependence on renal dialysis: Secondary | ICD-10-CM | POA: Diagnosis not present

## 2016-01-24 DIAGNOSIS — N186 End stage renal disease: Secondary | ICD-10-CM | POA: Diagnosis not present

## 2016-01-26 DIAGNOSIS — N186 End stage renal disease: Secondary | ICD-10-CM | POA: Diagnosis not present

## 2016-01-26 DIAGNOSIS — Z992 Dependence on renal dialysis: Secondary | ICD-10-CM | POA: Diagnosis not present

## 2016-01-26 DIAGNOSIS — D509 Iron deficiency anemia, unspecified: Secondary | ICD-10-CM | POA: Diagnosis not present

## 2016-01-26 DIAGNOSIS — N2581 Secondary hyperparathyroidism of renal origin: Secondary | ICD-10-CM | POA: Diagnosis not present

## 2016-01-26 DIAGNOSIS — D631 Anemia in chronic kidney disease: Secondary | ICD-10-CM | POA: Diagnosis not present

## 2016-01-28 DIAGNOSIS — N2581 Secondary hyperparathyroidism of renal origin: Secondary | ICD-10-CM | POA: Diagnosis not present

## 2016-01-28 DIAGNOSIS — D509 Iron deficiency anemia, unspecified: Secondary | ICD-10-CM | POA: Diagnosis not present

## 2016-01-28 DIAGNOSIS — N186 End stage renal disease: Secondary | ICD-10-CM | POA: Diagnosis not present

## 2016-01-28 DIAGNOSIS — Z992 Dependence on renal dialysis: Secondary | ICD-10-CM | POA: Diagnosis not present

## 2016-01-28 DIAGNOSIS — D631 Anemia in chronic kidney disease: Secondary | ICD-10-CM | POA: Diagnosis not present

## 2016-01-31 DIAGNOSIS — D509 Iron deficiency anemia, unspecified: Secondary | ICD-10-CM | POA: Diagnosis not present

## 2016-01-31 DIAGNOSIS — Z992 Dependence on renal dialysis: Secondary | ICD-10-CM | POA: Diagnosis not present

## 2016-01-31 DIAGNOSIS — D631 Anemia in chronic kidney disease: Secondary | ICD-10-CM | POA: Diagnosis not present

## 2016-01-31 DIAGNOSIS — N186 End stage renal disease: Secondary | ICD-10-CM | POA: Diagnosis not present

## 2016-01-31 DIAGNOSIS — N2581 Secondary hyperparathyroidism of renal origin: Secondary | ICD-10-CM | POA: Diagnosis not present

## 2016-02-02 DIAGNOSIS — D631 Anemia in chronic kidney disease: Secondary | ICD-10-CM | POA: Diagnosis not present

## 2016-02-02 DIAGNOSIS — Z992 Dependence on renal dialysis: Secondary | ICD-10-CM | POA: Diagnosis not present

## 2016-02-02 DIAGNOSIS — N2581 Secondary hyperparathyroidism of renal origin: Secondary | ICD-10-CM | POA: Diagnosis not present

## 2016-02-02 DIAGNOSIS — D509 Iron deficiency anemia, unspecified: Secondary | ICD-10-CM | POA: Diagnosis not present

## 2016-02-02 DIAGNOSIS — N186 End stage renal disease: Secondary | ICD-10-CM | POA: Diagnosis not present

## 2016-02-04 DIAGNOSIS — D509 Iron deficiency anemia, unspecified: Secondary | ICD-10-CM | POA: Diagnosis not present

## 2016-02-04 DIAGNOSIS — Z992 Dependence on renal dialysis: Secondary | ICD-10-CM | POA: Diagnosis not present

## 2016-02-04 DIAGNOSIS — D631 Anemia in chronic kidney disease: Secondary | ICD-10-CM | POA: Diagnosis not present

## 2016-02-04 DIAGNOSIS — N186 End stage renal disease: Secondary | ICD-10-CM | POA: Diagnosis not present

## 2016-02-04 DIAGNOSIS — N2581 Secondary hyperparathyroidism of renal origin: Secondary | ICD-10-CM | POA: Diagnosis not present

## 2016-02-07 DIAGNOSIS — N186 End stage renal disease: Secondary | ICD-10-CM | POA: Diagnosis not present

## 2016-02-07 DIAGNOSIS — Z992 Dependence on renal dialysis: Secondary | ICD-10-CM | POA: Diagnosis not present

## 2016-02-07 DIAGNOSIS — D509 Iron deficiency anemia, unspecified: Secondary | ICD-10-CM | POA: Diagnosis not present

## 2016-02-07 DIAGNOSIS — D631 Anemia in chronic kidney disease: Secondary | ICD-10-CM | POA: Diagnosis not present

## 2016-02-07 DIAGNOSIS — N2581 Secondary hyperparathyroidism of renal origin: Secondary | ICD-10-CM | POA: Diagnosis not present

## 2016-02-09 DIAGNOSIS — D509 Iron deficiency anemia, unspecified: Secondary | ICD-10-CM | POA: Diagnosis not present

## 2016-02-09 DIAGNOSIS — N2581 Secondary hyperparathyroidism of renal origin: Secondary | ICD-10-CM | POA: Diagnosis not present

## 2016-02-09 DIAGNOSIS — D631 Anemia in chronic kidney disease: Secondary | ICD-10-CM | POA: Diagnosis not present

## 2016-02-09 DIAGNOSIS — Z992 Dependence on renal dialysis: Secondary | ICD-10-CM | POA: Diagnosis not present

## 2016-02-09 DIAGNOSIS — N186 End stage renal disease: Secondary | ICD-10-CM | POA: Diagnosis not present

## 2016-02-11 DIAGNOSIS — N2581 Secondary hyperparathyroidism of renal origin: Secondary | ICD-10-CM | POA: Diagnosis not present

## 2016-02-11 DIAGNOSIS — Z992 Dependence on renal dialysis: Secondary | ICD-10-CM | POA: Diagnosis not present

## 2016-02-11 DIAGNOSIS — D509 Iron deficiency anemia, unspecified: Secondary | ICD-10-CM | POA: Diagnosis not present

## 2016-02-11 DIAGNOSIS — D631 Anemia in chronic kidney disease: Secondary | ICD-10-CM | POA: Diagnosis not present

## 2016-02-11 DIAGNOSIS — N186 End stage renal disease: Secondary | ICD-10-CM | POA: Diagnosis not present

## 2016-02-14 DIAGNOSIS — Z992 Dependence on renal dialysis: Secondary | ICD-10-CM | POA: Diagnosis not present

## 2016-02-14 DIAGNOSIS — N186 End stage renal disease: Secondary | ICD-10-CM | POA: Diagnosis not present

## 2016-02-14 DIAGNOSIS — N2581 Secondary hyperparathyroidism of renal origin: Secondary | ICD-10-CM | POA: Diagnosis not present

## 2016-02-14 DIAGNOSIS — D509 Iron deficiency anemia, unspecified: Secondary | ICD-10-CM | POA: Diagnosis not present

## 2016-02-14 DIAGNOSIS — D631 Anemia in chronic kidney disease: Secondary | ICD-10-CM | POA: Diagnosis not present

## 2016-02-16 DIAGNOSIS — D509 Iron deficiency anemia, unspecified: Secondary | ICD-10-CM | POA: Diagnosis not present

## 2016-02-16 DIAGNOSIS — D631 Anemia in chronic kidney disease: Secondary | ICD-10-CM | POA: Diagnosis not present

## 2016-02-16 DIAGNOSIS — N186 End stage renal disease: Secondary | ICD-10-CM | POA: Diagnosis not present

## 2016-02-16 DIAGNOSIS — Z992 Dependence on renal dialysis: Secondary | ICD-10-CM | POA: Diagnosis not present

## 2016-02-16 DIAGNOSIS — N2581 Secondary hyperparathyroidism of renal origin: Secondary | ICD-10-CM | POA: Diagnosis not present

## 2016-02-18 DIAGNOSIS — D631 Anemia in chronic kidney disease: Secondary | ICD-10-CM | POA: Diagnosis not present

## 2016-02-18 DIAGNOSIS — N186 End stage renal disease: Secondary | ICD-10-CM | POA: Diagnosis not present

## 2016-02-18 DIAGNOSIS — N2581 Secondary hyperparathyroidism of renal origin: Secondary | ICD-10-CM | POA: Diagnosis not present

## 2016-02-18 DIAGNOSIS — D509 Iron deficiency anemia, unspecified: Secondary | ICD-10-CM | POA: Diagnosis not present

## 2016-02-18 DIAGNOSIS — Z992 Dependence on renal dialysis: Secondary | ICD-10-CM | POA: Diagnosis not present

## 2016-02-21 DIAGNOSIS — D509 Iron deficiency anemia, unspecified: Secondary | ICD-10-CM | POA: Diagnosis not present

## 2016-02-21 DIAGNOSIS — N186 End stage renal disease: Secondary | ICD-10-CM | POA: Diagnosis not present

## 2016-02-21 DIAGNOSIS — N2581 Secondary hyperparathyroidism of renal origin: Secondary | ICD-10-CM | POA: Diagnosis not present

## 2016-02-21 DIAGNOSIS — D631 Anemia in chronic kidney disease: Secondary | ICD-10-CM | POA: Diagnosis not present

## 2016-02-21 DIAGNOSIS — Z992 Dependence on renal dialysis: Secondary | ICD-10-CM | POA: Diagnosis not present

## 2016-02-23 DIAGNOSIS — Z992 Dependence on renal dialysis: Secondary | ICD-10-CM | POA: Diagnosis not present

## 2016-02-23 DIAGNOSIS — D631 Anemia in chronic kidney disease: Secondary | ICD-10-CM | POA: Diagnosis not present

## 2016-02-23 DIAGNOSIS — D509 Iron deficiency anemia, unspecified: Secondary | ICD-10-CM | POA: Diagnosis not present

## 2016-02-23 DIAGNOSIS — N186 End stage renal disease: Secondary | ICD-10-CM | POA: Diagnosis not present

## 2016-02-23 DIAGNOSIS — N2581 Secondary hyperparathyroidism of renal origin: Secondary | ICD-10-CM | POA: Diagnosis not present

## 2016-02-25 DIAGNOSIS — D509 Iron deficiency anemia, unspecified: Secondary | ICD-10-CM | POA: Diagnosis not present

## 2016-02-25 DIAGNOSIS — Z992 Dependence on renal dialysis: Secondary | ICD-10-CM | POA: Diagnosis not present

## 2016-02-25 DIAGNOSIS — N2581 Secondary hyperparathyroidism of renal origin: Secondary | ICD-10-CM | POA: Diagnosis not present

## 2016-02-25 DIAGNOSIS — N186 End stage renal disease: Secondary | ICD-10-CM | POA: Diagnosis not present

## 2016-02-25 DIAGNOSIS — D631 Anemia in chronic kidney disease: Secondary | ICD-10-CM | POA: Diagnosis not present

## 2016-02-28 DIAGNOSIS — D509 Iron deficiency anemia, unspecified: Secondary | ICD-10-CM | POA: Diagnosis not present

## 2016-02-28 DIAGNOSIS — Z992 Dependence on renal dialysis: Secondary | ICD-10-CM | POA: Diagnosis not present

## 2016-02-28 DIAGNOSIS — D631 Anemia in chronic kidney disease: Secondary | ICD-10-CM | POA: Diagnosis not present

## 2016-02-28 DIAGNOSIS — N2581 Secondary hyperparathyroidism of renal origin: Secondary | ICD-10-CM | POA: Diagnosis not present

## 2016-02-28 DIAGNOSIS — N186 End stage renal disease: Secondary | ICD-10-CM | POA: Diagnosis not present

## 2016-02-29 DIAGNOSIS — F17200 Nicotine dependence, unspecified, uncomplicated: Secondary | ICD-10-CM | POA: Diagnosis not present

## 2016-02-29 DIAGNOSIS — I739 Peripheral vascular disease, unspecified: Secondary | ICD-10-CM | POA: Diagnosis not present

## 2016-02-29 DIAGNOSIS — N186 End stage renal disease: Secondary | ICD-10-CM | POA: Diagnosis not present

## 2016-02-29 DIAGNOSIS — I10 Essential (primary) hypertension: Secondary | ICD-10-CM | POA: Diagnosis not present

## 2016-03-01 DIAGNOSIS — D631 Anemia in chronic kidney disease: Secondary | ICD-10-CM | POA: Diagnosis not present

## 2016-03-01 DIAGNOSIS — Z992 Dependence on renal dialysis: Secondary | ICD-10-CM | POA: Diagnosis not present

## 2016-03-01 DIAGNOSIS — N186 End stage renal disease: Secondary | ICD-10-CM | POA: Diagnosis not present

## 2016-03-01 DIAGNOSIS — D509 Iron deficiency anemia, unspecified: Secondary | ICD-10-CM | POA: Diagnosis not present

## 2016-03-01 DIAGNOSIS — N2581 Secondary hyperparathyroidism of renal origin: Secondary | ICD-10-CM | POA: Diagnosis not present

## 2016-03-03 DIAGNOSIS — Z992 Dependence on renal dialysis: Secondary | ICD-10-CM | POA: Diagnosis not present

## 2016-03-03 DIAGNOSIS — N186 End stage renal disease: Secondary | ICD-10-CM | POA: Diagnosis not present

## 2016-03-03 DIAGNOSIS — D631 Anemia in chronic kidney disease: Secondary | ICD-10-CM | POA: Diagnosis not present

## 2016-03-03 DIAGNOSIS — D509 Iron deficiency anemia, unspecified: Secondary | ICD-10-CM | POA: Diagnosis not present

## 2016-03-03 DIAGNOSIS — N2581 Secondary hyperparathyroidism of renal origin: Secondary | ICD-10-CM | POA: Diagnosis not present

## 2016-03-06 DIAGNOSIS — N2581 Secondary hyperparathyroidism of renal origin: Secondary | ICD-10-CM | POA: Diagnosis not present

## 2016-03-06 DIAGNOSIS — D631 Anemia in chronic kidney disease: Secondary | ICD-10-CM | POA: Diagnosis not present

## 2016-03-06 DIAGNOSIS — D509 Iron deficiency anemia, unspecified: Secondary | ICD-10-CM | POA: Diagnosis not present

## 2016-03-06 DIAGNOSIS — Z992 Dependence on renal dialysis: Secondary | ICD-10-CM | POA: Diagnosis not present

## 2016-03-06 DIAGNOSIS — N186 End stage renal disease: Secondary | ICD-10-CM | POA: Diagnosis not present

## 2016-03-08 DIAGNOSIS — D631 Anemia in chronic kidney disease: Secondary | ICD-10-CM | POA: Diagnosis not present

## 2016-03-08 DIAGNOSIS — N2581 Secondary hyperparathyroidism of renal origin: Secondary | ICD-10-CM | POA: Diagnosis not present

## 2016-03-08 DIAGNOSIS — Z992 Dependence on renal dialysis: Secondary | ICD-10-CM | POA: Diagnosis not present

## 2016-03-08 DIAGNOSIS — D509 Iron deficiency anemia, unspecified: Secondary | ICD-10-CM | POA: Diagnosis not present

## 2016-03-08 DIAGNOSIS — N186 End stage renal disease: Secondary | ICD-10-CM | POA: Diagnosis not present

## 2016-03-09 DIAGNOSIS — N186 End stage renal disease: Secondary | ICD-10-CM | POA: Diagnosis not present

## 2016-03-09 DIAGNOSIS — Z992 Dependence on renal dialysis: Secondary | ICD-10-CM | POA: Diagnosis not present

## 2016-03-10 DIAGNOSIS — D509 Iron deficiency anemia, unspecified: Secondary | ICD-10-CM | POA: Diagnosis not present

## 2016-03-10 DIAGNOSIS — Z23 Encounter for immunization: Secondary | ICD-10-CM | POA: Diagnosis not present

## 2016-03-10 DIAGNOSIS — N2581 Secondary hyperparathyroidism of renal origin: Secondary | ICD-10-CM | POA: Diagnosis not present

## 2016-03-10 DIAGNOSIS — D631 Anemia in chronic kidney disease: Secondary | ICD-10-CM | POA: Diagnosis not present

## 2016-03-10 DIAGNOSIS — Z992 Dependence on renal dialysis: Secondary | ICD-10-CM | POA: Diagnosis not present

## 2016-03-10 DIAGNOSIS — N186 End stage renal disease: Secondary | ICD-10-CM | POA: Diagnosis not present

## 2016-03-13 DIAGNOSIS — Z23 Encounter for immunization: Secondary | ICD-10-CM | POA: Diagnosis not present

## 2016-03-13 DIAGNOSIS — Z992 Dependence on renal dialysis: Secondary | ICD-10-CM | POA: Diagnosis not present

## 2016-03-13 DIAGNOSIS — D509 Iron deficiency anemia, unspecified: Secondary | ICD-10-CM | POA: Diagnosis not present

## 2016-03-13 DIAGNOSIS — D631 Anemia in chronic kidney disease: Secondary | ICD-10-CM | POA: Diagnosis not present

## 2016-03-13 DIAGNOSIS — N2581 Secondary hyperparathyroidism of renal origin: Secondary | ICD-10-CM | POA: Diagnosis not present

## 2016-03-13 DIAGNOSIS — N186 End stage renal disease: Secondary | ICD-10-CM | POA: Diagnosis not present

## 2016-03-15 DIAGNOSIS — D509 Iron deficiency anemia, unspecified: Secondary | ICD-10-CM | POA: Diagnosis not present

## 2016-03-15 DIAGNOSIS — D631 Anemia in chronic kidney disease: Secondary | ICD-10-CM | POA: Diagnosis not present

## 2016-03-15 DIAGNOSIS — N186 End stage renal disease: Secondary | ICD-10-CM | POA: Diagnosis not present

## 2016-03-15 DIAGNOSIS — N2581 Secondary hyperparathyroidism of renal origin: Secondary | ICD-10-CM | POA: Diagnosis not present

## 2016-03-15 DIAGNOSIS — Z23 Encounter for immunization: Secondary | ICD-10-CM | POA: Diagnosis not present

## 2016-03-15 DIAGNOSIS — Z992 Dependence on renal dialysis: Secondary | ICD-10-CM | POA: Diagnosis not present

## 2016-03-17 DIAGNOSIS — N186 End stage renal disease: Secondary | ICD-10-CM | POA: Diagnosis not present

## 2016-03-17 DIAGNOSIS — D509 Iron deficiency anemia, unspecified: Secondary | ICD-10-CM | POA: Diagnosis not present

## 2016-03-17 DIAGNOSIS — Z23 Encounter for immunization: Secondary | ICD-10-CM | POA: Diagnosis not present

## 2016-03-17 DIAGNOSIS — Z992 Dependence on renal dialysis: Secondary | ICD-10-CM | POA: Diagnosis not present

## 2016-03-17 DIAGNOSIS — D631 Anemia in chronic kidney disease: Secondary | ICD-10-CM | POA: Diagnosis not present

## 2016-03-17 DIAGNOSIS — N2581 Secondary hyperparathyroidism of renal origin: Secondary | ICD-10-CM | POA: Diagnosis not present

## 2016-03-20 DIAGNOSIS — D509 Iron deficiency anemia, unspecified: Secondary | ICD-10-CM | POA: Diagnosis not present

## 2016-03-20 DIAGNOSIS — N186 End stage renal disease: Secondary | ICD-10-CM | POA: Diagnosis not present

## 2016-03-20 DIAGNOSIS — N2581 Secondary hyperparathyroidism of renal origin: Secondary | ICD-10-CM | POA: Diagnosis not present

## 2016-03-20 DIAGNOSIS — D631 Anemia in chronic kidney disease: Secondary | ICD-10-CM | POA: Diagnosis not present

## 2016-03-20 DIAGNOSIS — Z992 Dependence on renal dialysis: Secondary | ICD-10-CM | POA: Diagnosis not present

## 2016-03-20 DIAGNOSIS — Z23 Encounter for immunization: Secondary | ICD-10-CM | POA: Diagnosis not present

## 2016-03-22 DIAGNOSIS — N2581 Secondary hyperparathyroidism of renal origin: Secondary | ICD-10-CM | POA: Diagnosis not present

## 2016-03-22 DIAGNOSIS — N186 End stage renal disease: Secondary | ICD-10-CM | POA: Diagnosis not present

## 2016-03-22 DIAGNOSIS — Z23 Encounter for immunization: Secondary | ICD-10-CM | POA: Diagnosis not present

## 2016-03-22 DIAGNOSIS — D631 Anemia in chronic kidney disease: Secondary | ICD-10-CM | POA: Diagnosis not present

## 2016-03-22 DIAGNOSIS — D509 Iron deficiency anemia, unspecified: Secondary | ICD-10-CM | POA: Diagnosis not present

## 2016-03-22 DIAGNOSIS — Z992 Dependence on renal dialysis: Secondary | ICD-10-CM | POA: Diagnosis not present

## 2016-03-24 DIAGNOSIS — D509 Iron deficiency anemia, unspecified: Secondary | ICD-10-CM | POA: Diagnosis not present

## 2016-03-24 DIAGNOSIS — Z23 Encounter for immunization: Secondary | ICD-10-CM | POA: Diagnosis not present

## 2016-03-24 DIAGNOSIS — N2581 Secondary hyperparathyroidism of renal origin: Secondary | ICD-10-CM | POA: Diagnosis not present

## 2016-03-24 DIAGNOSIS — D631 Anemia in chronic kidney disease: Secondary | ICD-10-CM | POA: Diagnosis not present

## 2016-03-24 DIAGNOSIS — Z992 Dependence on renal dialysis: Secondary | ICD-10-CM | POA: Diagnosis not present

## 2016-03-24 DIAGNOSIS — N186 End stage renal disease: Secondary | ICD-10-CM | POA: Diagnosis not present

## 2016-03-27 DIAGNOSIS — N186 End stage renal disease: Secondary | ICD-10-CM | POA: Diagnosis not present

## 2016-03-27 DIAGNOSIS — D509 Iron deficiency anemia, unspecified: Secondary | ICD-10-CM | POA: Diagnosis not present

## 2016-03-27 DIAGNOSIS — Z992 Dependence on renal dialysis: Secondary | ICD-10-CM | POA: Diagnosis not present

## 2016-03-27 DIAGNOSIS — Z23 Encounter for immunization: Secondary | ICD-10-CM | POA: Diagnosis not present

## 2016-03-27 DIAGNOSIS — D631 Anemia in chronic kidney disease: Secondary | ICD-10-CM | POA: Diagnosis not present

## 2016-03-27 DIAGNOSIS — N2581 Secondary hyperparathyroidism of renal origin: Secondary | ICD-10-CM | POA: Diagnosis not present

## 2016-03-28 ENCOUNTER — Ambulatory Visit (HOSPITAL_COMMUNITY)
Admission: RE | Admit: 2016-03-28 | Discharge: 2016-03-28 | Disposition: A | Discharge status: Home / Self Care | Payer: Medicaid Other | Source: Ambulatory Visit | Attending: Pulmonary Disease | Admitting: Pulmonary Disease

## 2016-03-28 ENCOUNTER — Other Ambulatory Visit (HOSPITAL_COMMUNITY): Payer: Self-pay | Admitting: Pulmonary Disease

## 2016-03-28 DIAGNOSIS — J410 Simple chronic bronchitis: Secondary | ICD-10-CM

## 2016-03-28 DIAGNOSIS — J449 Chronic obstructive pulmonary disease, unspecified: Secondary | ICD-10-CM | POA: Diagnosis present

## 2016-03-29 DIAGNOSIS — D631 Anemia in chronic kidney disease: Secondary | ICD-10-CM | POA: Diagnosis not present

## 2016-03-29 DIAGNOSIS — N2581 Secondary hyperparathyroidism of renal origin: Secondary | ICD-10-CM | POA: Diagnosis not present

## 2016-03-29 DIAGNOSIS — Z992 Dependence on renal dialysis: Secondary | ICD-10-CM | POA: Diagnosis not present

## 2016-03-29 DIAGNOSIS — D509 Iron deficiency anemia, unspecified: Secondary | ICD-10-CM | POA: Diagnosis not present

## 2016-03-29 DIAGNOSIS — N186 End stage renal disease: Secondary | ICD-10-CM | POA: Diagnosis not present

## 2016-03-29 DIAGNOSIS — Z23 Encounter for immunization: Secondary | ICD-10-CM | POA: Diagnosis not present

## 2016-03-31 DIAGNOSIS — D509 Iron deficiency anemia, unspecified: Secondary | ICD-10-CM | POA: Diagnosis not present

## 2016-03-31 DIAGNOSIS — Z23 Encounter for immunization: Secondary | ICD-10-CM | POA: Diagnosis not present

## 2016-03-31 DIAGNOSIS — Z992 Dependence on renal dialysis: Secondary | ICD-10-CM | POA: Diagnosis not present

## 2016-03-31 DIAGNOSIS — D631 Anemia in chronic kidney disease: Secondary | ICD-10-CM | POA: Diagnosis not present

## 2016-03-31 DIAGNOSIS — N2581 Secondary hyperparathyroidism of renal origin: Secondary | ICD-10-CM | POA: Diagnosis not present

## 2016-03-31 DIAGNOSIS — N186 End stage renal disease: Secondary | ICD-10-CM | POA: Diagnosis not present

## 2016-04-03 DIAGNOSIS — Z23 Encounter for immunization: Secondary | ICD-10-CM | POA: Diagnosis not present

## 2016-04-03 DIAGNOSIS — Z992 Dependence on renal dialysis: Secondary | ICD-10-CM | POA: Diagnosis not present

## 2016-04-03 DIAGNOSIS — D631 Anemia in chronic kidney disease: Secondary | ICD-10-CM | POA: Diagnosis not present

## 2016-04-03 DIAGNOSIS — N186 End stage renal disease: Secondary | ICD-10-CM | POA: Diagnosis not present

## 2016-04-03 DIAGNOSIS — N2581 Secondary hyperparathyroidism of renal origin: Secondary | ICD-10-CM | POA: Diagnosis not present

## 2016-04-03 DIAGNOSIS — D509 Iron deficiency anemia, unspecified: Secondary | ICD-10-CM | POA: Diagnosis not present

## 2016-04-05 DIAGNOSIS — N186 End stage renal disease: Secondary | ICD-10-CM | POA: Diagnosis not present

## 2016-04-05 DIAGNOSIS — N2581 Secondary hyperparathyroidism of renal origin: Secondary | ICD-10-CM | POA: Diagnosis not present

## 2016-04-05 DIAGNOSIS — D631 Anemia in chronic kidney disease: Secondary | ICD-10-CM | POA: Diagnosis not present

## 2016-04-05 DIAGNOSIS — Z992 Dependence on renal dialysis: Secondary | ICD-10-CM | POA: Diagnosis not present

## 2016-04-05 DIAGNOSIS — D509 Iron deficiency anemia, unspecified: Secondary | ICD-10-CM | POA: Diagnosis not present

## 2016-04-05 DIAGNOSIS — Z23 Encounter for immunization: Secondary | ICD-10-CM | POA: Diagnosis not present

## 2016-04-07 DIAGNOSIS — N186 End stage renal disease: Secondary | ICD-10-CM | POA: Diagnosis not present

## 2016-04-07 DIAGNOSIS — N2581 Secondary hyperparathyroidism of renal origin: Secondary | ICD-10-CM | POA: Diagnosis not present

## 2016-04-07 DIAGNOSIS — D631 Anemia in chronic kidney disease: Secondary | ICD-10-CM | POA: Diagnosis not present

## 2016-04-07 DIAGNOSIS — Z992 Dependence on renal dialysis: Secondary | ICD-10-CM | POA: Diagnosis not present

## 2016-04-07 DIAGNOSIS — Z23 Encounter for immunization: Secondary | ICD-10-CM | POA: Diagnosis not present

## 2016-04-07 DIAGNOSIS — D509 Iron deficiency anemia, unspecified: Secondary | ICD-10-CM | POA: Diagnosis not present

## 2016-04-08 DIAGNOSIS — Z992 Dependence on renal dialysis: Secondary | ICD-10-CM | POA: Diagnosis not present

## 2016-04-08 DIAGNOSIS — N186 End stage renal disease: Secondary | ICD-10-CM | POA: Diagnosis not present

## 2016-04-10 DIAGNOSIS — D631 Anemia in chronic kidney disease: Secondary | ICD-10-CM | POA: Diagnosis not present

## 2016-04-10 DIAGNOSIS — Z992 Dependence on renal dialysis: Secondary | ICD-10-CM | POA: Diagnosis not present

## 2016-04-10 DIAGNOSIS — N2581 Secondary hyperparathyroidism of renal origin: Secondary | ICD-10-CM | POA: Diagnosis not present

## 2016-04-10 DIAGNOSIS — N186 End stage renal disease: Secondary | ICD-10-CM | POA: Diagnosis not present

## 2016-04-10 DIAGNOSIS — D509 Iron deficiency anemia, unspecified: Secondary | ICD-10-CM | POA: Diagnosis not present

## 2016-04-12 DIAGNOSIS — N186 End stage renal disease: Secondary | ICD-10-CM | POA: Diagnosis not present

## 2016-04-12 DIAGNOSIS — Z992 Dependence on renal dialysis: Secondary | ICD-10-CM | POA: Diagnosis not present

## 2016-04-12 DIAGNOSIS — D509 Iron deficiency anemia, unspecified: Secondary | ICD-10-CM | POA: Diagnosis not present

## 2016-04-12 DIAGNOSIS — N2581 Secondary hyperparathyroidism of renal origin: Secondary | ICD-10-CM | POA: Diagnosis not present

## 2016-04-12 DIAGNOSIS — D631 Anemia in chronic kidney disease: Secondary | ICD-10-CM | POA: Diagnosis not present

## 2016-04-14 DIAGNOSIS — D631 Anemia in chronic kidney disease: Secondary | ICD-10-CM | POA: Diagnosis not present

## 2016-04-14 DIAGNOSIS — N186 End stage renal disease: Secondary | ICD-10-CM | POA: Diagnosis not present

## 2016-04-14 DIAGNOSIS — Z992 Dependence on renal dialysis: Secondary | ICD-10-CM | POA: Diagnosis not present

## 2016-04-14 DIAGNOSIS — D509 Iron deficiency anemia, unspecified: Secondary | ICD-10-CM | POA: Diagnosis not present

## 2016-04-14 DIAGNOSIS — N2581 Secondary hyperparathyroidism of renal origin: Secondary | ICD-10-CM | POA: Diagnosis not present

## 2016-04-17 DIAGNOSIS — D631 Anemia in chronic kidney disease: Secondary | ICD-10-CM | POA: Diagnosis not present

## 2016-04-17 DIAGNOSIS — N186 End stage renal disease: Secondary | ICD-10-CM | POA: Diagnosis not present

## 2016-04-17 DIAGNOSIS — D509 Iron deficiency anemia, unspecified: Secondary | ICD-10-CM | POA: Diagnosis not present

## 2016-04-17 DIAGNOSIS — Z992 Dependence on renal dialysis: Secondary | ICD-10-CM | POA: Diagnosis not present

## 2016-04-17 DIAGNOSIS — N2581 Secondary hyperparathyroidism of renal origin: Secondary | ICD-10-CM | POA: Diagnosis not present

## 2016-04-19 DIAGNOSIS — N2581 Secondary hyperparathyroidism of renal origin: Secondary | ICD-10-CM | POA: Diagnosis not present

## 2016-04-19 DIAGNOSIS — N186 End stage renal disease: Secondary | ICD-10-CM | POA: Diagnosis not present

## 2016-04-19 DIAGNOSIS — D509 Iron deficiency anemia, unspecified: Secondary | ICD-10-CM | POA: Diagnosis not present

## 2016-04-19 DIAGNOSIS — D631 Anemia in chronic kidney disease: Secondary | ICD-10-CM | POA: Diagnosis not present

## 2016-04-19 DIAGNOSIS — Z992 Dependence on renal dialysis: Secondary | ICD-10-CM | POA: Diagnosis not present

## 2016-04-21 DIAGNOSIS — D509 Iron deficiency anemia, unspecified: Secondary | ICD-10-CM | POA: Diagnosis not present

## 2016-04-21 DIAGNOSIS — Z992 Dependence on renal dialysis: Secondary | ICD-10-CM | POA: Diagnosis not present

## 2016-04-21 DIAGNOSIS — N186 End stage renal disease: Secondary | ICD-10-CM | POA: Diagnosis not present

## 2016-04-21 DIAGNOSIS — D631 Anemia in chronic kidney disease: Secondary | ICD-10-CM | POA: Diagnosis not present

## 2016-04-21 DIAGNOSIS — N2581 Secondary hyperparathyroidism of renal origin: Secondary | ICD-10-CM | POA: Diagnosis not present

## 2016-04-24 DIAGNOSIS — N2581 Secondary hyperparathyroidism of renal origin: Secondary | ICD-10-CM | POA: Diagnosis not present

## 2016-04-24 DIAGNOSIS — Z992 Dependence on renal dialysis: Secondary | ICD-10-CM | POA: Diagnosis not present

## 2016-04-24 DIAGNOSIS — D631 Anemia in chronic kidney disease: Secondary | ICD-10-CM | POA: Diagnosis not present

## 2016-04-24 DIAGNOSIS — D509 Iron deficiency anemia, unspecified: Secondary | ICD-10-CM | POA: Diagnosis not present

## 2016-04-24 DIAGNOSIS — N186 End stage renal disease: Secondary | ICD-10-CM | POA: Diagnosis not present

## 2016-04-26 DIAGNOSIS — Z992 Dependence on renal dialysis: Secondary | ICD-10-CM | POA: Diagnosis not present

## 2016-04-26 DIAGNOSIS — D509 Iron deficiency anemia, unspecified: Secondary | ICD-10-CM | POA: Diagnosis not present

## 2016-04-26 DIAGNOSIS — N186 End stage renal disease: Secondary | ICD-10-CM | POA: Diagnosis not present

## 2016-04-26 DIAGNOSIS — D631 Anemia in chronic kidney disease: Secondary | ICD-10-CM | POA: Diagnosis not present

## 2016-04-26 DIAGNOSIS — N2581 Secondary hyperparathyroidism of renal origin: Secondary | ICD-10-CM | POA: Diagnosis not present

## 2016-04-28 DIAGNOSIS — Z992 Dependence on renal dialysis: Secondary | ICD-10-CM | POA: Diagnosis not present

## 2016-04-28 DIAGNOSIS — N186 End stage renal disease: Secondary | ICD-10-CM | POA: Diagnosis not present

## 2016-04-28 DIAGNOSIS — D509 Iron deficiency anemia, unspecified: Secondary | ICD-10-CM | POA: Diagnosis not present

## 2016-04-28 DIAGNOSIS — N2581 Secondary hyperparathyroidism of renal origin: Secondary | ICD-10-CM | POA: Diagnosis not present

## 2016-04-28 DIAGNOSIS — D631 Anemia in chronic kidney disease: Secondary | ICD-10-CM | POA: Diagnosis not present

## 2016-05-01 DIAGNOSIS — N2581 Secondary hyperparathyroidism of renal origin: Secondary | ICD-10-CM | POA: Diagnosis not present

## 2016-05-01 DIAGNOSIS — Z992 Dependence on renal dialysis: Secondary | ICD-10-CM | POA: Diagnosis not present

## 2016-05-01 DIAGNOSIS — N186 End stage renal disease: Secondary | ICD-10-CM | POA: Diagnosis not present

## 2016-05-01 DIAGNOSIS — D631 Anemia in chronic kidney disease: Secondary | ICD-10-CM | POA: Diagnosis not present

## 2016-05-01 DIAGNOSIS — D509 Iron deficiency anemia, unspecified: Secondary | ICD-10-CM | POA: Diagnosis not present

## 2016-05-03 DIAGNOSIS — N186 End stage renal disease: Secondary | ICD-10-CM | POA: Diagnosis not present

## 2016-05-03 DIAGNOSIS — D509 Iron deficiency anemia, unspecified: Secondary | ICD-10-CM | POA: Diagnosis not present

## 2016-05-03 DIAGNOSIS — D631 Anemia in chronic kidney disease: Secondary | ICD-10-CM | POA: Diagnosis not present

## 2016-05-03 DIAGNOSIS — N2581 Secondary hyperparathyroidism of renal origin: Secondary | ICD-10-CM | POA: Diagnosis not present

## 2016-05-03 DIAGNOSIS — Z992 Dependence on renal dialysis: Secondary | ICD-10-CM | POA: Diagnosis not present

## 2016-05-05 DIAGNOSIS — D509 Iron deficiency anemia, unspecified: Secondary | ICD-10-CM | POA: Diagnosis not present

## 2016-05-05 DIAGNOSIS — D631 Anemia in chronic kidney disease: Secondary | ICD-10-CM | POA: Diagnosis not present

## 2016-05-05 DIAGNOSIS — N186 End stage renal disease: Secondary | ICD-10-CM | POA: Diagnosis not present

## 2016-05-05 DIAGNOSIS — N2581 Secondary hyperparathyroidism of renal origin: Secondary | ICD-10-CM | POA: Diagnosis not present

## 2016-05-05 DIAGNOSIS — Z992 Dependence on renal dialysis: Secondary | ICD-10-CM | POA: Diagnosis not present

## 2016-05-08 DIAGNOSIS — N2581 Secondary hyperparathyroidism of renal origin: Secondary | ICD-10-CM | POA: Diagnosis not present

## 2016-05-08 DIAGNOSIS — D631 Anemia in chronic kidney disease: Secondary | ICD-10-CM | POA: Diagnosis not present

## 2016-05-08 DIAGNOSIS — Z992 Dependence on renal dialysis: Secondary | ICD-10-CM | POA: Diagnosis not present

## 2016-05-08 DIAGNOSIS — N186 End stage renal disease: Secondary | ICD-10-CM | POA: Diagnosis not present

## 2016-05-08 DIAGNOSIS — D509 Iron deficiency anemia, unspecified: Secondary | ICD-10-CM | POA: Diagnosis not present

## 2016-05-09 DIAGNOSIS — N186 End stage renal disease: Secondary | ICD-10-CM | POA: Diagnosis not present

## 2016-05-09 DIAGNOSIS — Z992 Dependence on renal dialysis: Secondary | ICD-10-CM | POA: Diagnosis not present

## 2016-05-10 DIAGNOSIS — Z992 Dependence on renal dialysis: Secondary | ICD-10-CM | POA: Diagnosis not present

## 2016-05-10 DIAGNOSIS — N186 End stage renal disease: Secondary | ICD-10-CM | POA: Diagnosis not present

## 2016-05-10 DIAGNOSIS — N2581 Secondary hyperparathyroidism of renal origin: Secondary | ICD-10-CM | POA: Diagnosis not present

## 2016-05-10 DIAGNOSIS — D509 Iron deficiency anemia, unspecified: Secondary | ICD-10-CM | POA: Diagnosis not present

## 2016-05-12 DIAGNOSIS — Z992 Dependence on renal dialysis: Secondary | ICD-10-CM | POA: Diagnosis not present

## 2016-05-12 DIAGNOSIS — N186 End stage renal disease: Secondary | ICD-10-CM | POA: Diagnosis not present

## 2016-05-12 DIAGNOSIS — D509 Iron deficiency anemia, unspecified: Secondary | ICD-10-CM | POA: Diagnosis not present

## 2016-05-12 DIAGNOSIS — N2581 Secondary hyperparathyroidism of renal origin: Secondary | ICD-10-CM | POA: Diagnosis not present

## 2016-05-15 DIAGNOSIS — Z992 Dependence on renal dialysis: Secondary | ICD-10-CM | POA: Diagnosis not present

## 2016-05-15 DIAGNOSIS — N186 End stage renal disease: Secondary | ICD-10-CM | POA: Diagnosis not present

## 2016-05-15 DIAGNOSIS — N2581 Secondary hyperparathyroidism of renal origin: Secondary | ICD-10-CM | POA: Diagnosis not present

## 2016-05-15 DIAGNOSIS — D509 Iron deficiency anemia, unspecified: Secondary | ICD-10-CM | POA: Diagnosis not present

## 2016-05-17 DIAGNOSIS — Z992 Dependence on renal dialysis: Secondary | ICD-10-CM | POA: Diagnosis not present

## 2016-05-17 DIAGNOSIS — N186 End stage renal disease: Secondary | ICD-10-CM | POA: Diagnosis not present

## 2016-05-17 DIAGNOSIS — D509 Iron deficiency anemia, unspecified: Secondary | ICD-10-CM | POA: Diagnosis not present

## 2016-05-17 DIAGNOSIS — N2581 Secondary hyperparathyroidism of renal origin: Secondary | ICD-10-CM | POA: Diagnosis not present

## 2016-05-19 DIAGNOSIS — N2581 Secondary hyperparathyroidism of renal origin: Secondary | ICD-10-CM | POA: Diagnosis not present

## 2016-05-19 DIAGNOSIS — N186 End stage renal disease: Secondary | ICD-10-CM | POA: Diagnosis not present

## 2016-05-19 DIAGNOSIS — D509 Iron deficiency anemia, unspecified: Secondary | ICD-10-CM | POA: Diagnosis not present

## 2016-05-19 DIAGNOSIS — Z992 Dependence on renal dialysis: Secondary | ICD-10-CM | POA: Diagnosis not present

## 2016-05-22 DIAGNOSIS — D509 Iron deficiency anemia, unspecified: Secondary | ICD-10-CM | POA: Diagnosis not present

## 2016-05-22 DIAGNOSIS — N186 End stage renal disease: Secondary | ICD-10-CM | POA: Diagnosis not present

## 2016-05-22 DIAGNOSIS — Z992 Dependence on renal dialysis: Secondary | ICD-10-CM | POA: Diagnosis not present

## 2016-05-22 DIAGNOSIS — N2581 Secondary hyperparathyroidism of renal origin: Secondary | ICD-10-CM | POA: Diagnosis not present

## 2016-05-24 DIAGNOSIS — Z992 Dependence on renal dialysis: Secondary | ICD-10-CM | POA: Diagnosis not present

## 2016-05-24 DIAGNOSIS — N186 End stage renal disease: Secondary | ICD-10-CM | POA: Diagnosis not present

## 2016-05-24 DIAGNOSIS — N2581 Secondary hyperparathyroidism of renal origin: Secondary | ICD-10-CM | POA: Diagnosis not present

## 2016-05-24 DIAGNOSIS — D509 Iron deficiency anemia, unspecified: Secondary | ICD-10-CM | POA: Diagnosis not present

## 2016-05-26 DIAGNOSIS — D509 Iron deficiency anemia, unspecified: Secondary | ICD-10-CM | POA: Diagnosis not present

## 2016-05-26 DIAGNOSIS — Z992 Dependence on renal dialysis: Secondary | ICD-10-CM | POA: Diagnosis not present

## 2016-05-26 DIAGNOSIS — N2581 Secondary hyperparathyroidism of renal origin: Secondary | ICD-10-CM | POA: Diagnosis not present

## 2016-05-26 DIAGNOSIS — N186 End stage renal disease: Secondary | ICD-10-CM | POA: Diagnosis not present

## 2016-05-29 DIAGNOSIS — D509 Iron deficiency anemia, unspecified: Secondary | ICD-10-CM | POA: Diagnosis not present

## 2016-05-29 DIAGNOSIS — Z992 Dependence on renal dialysis: Secondary | ICD-10-CM | POA: Diagnosis not present

## 2016-05-29 DIAGNOSIS — N2581 Secondary hyperparathyroidism of renal origin: Secondary | ICD-10-CM | POA: Diagnosis not present

## 2016-05-29 DIAGNOSIS — N186 End stage renal disease: Secondary | ICD-10-CM | POA: Diagnosis not present

## 2016-05-31 DIAGNOSIS — N2581 Secondary hyperparathyroidism of renal origin: Secondary | ICD-10-CM | POA: Diagnosis not present

## 2016-05-31 DIAGNOSIS — D509 Iron deficiency anemia, unspecified: Secondary | ICD-10-CM | POA: Diagnosis not present

## 2016-05-31 DIAGNOSIS — N186 End stage renal disease: Secondary | ICD-10-CM | POA: Diagnosis not present

## 2016-05-31 DIAGNOSIS — Z992 Dependence on renal dialysis: Secondary | ICD-10-CM | POA: Diagnosis not present

## 2016-06-02 DIAGNOSIS — N186 End stage renal disease: Secondary | ICD-10-CM | POA: Diagnosis not present

## 2016-06-02 DIAGNOSIS — N2581 Secondary hyperparathyroidism of renal origin: Secondary | ICD-10-CM | POA: Diagnosis not present

## 2016-06-02 DIAGNOSIS — Z992 Dependence on renal dialysis: Secondary | ICD-10-CM | POA: Diagnosis not present

## 2016-06-02 DIAGNOSIS — D509 Iron deficiency anemia, unspecified: Secondary | ICD-10-CM | POA: Diagnosis not present

## 2016-06-05 DIAGNOSIS — Z992 Dependence on renal dialysis: Secondary | ICD-10-CM | POA: Diagnosis not present

## 2016-06-05 DIAGNOSIS — D509 Iron deficiency anemia, unspecified: Secondary | ICD-10-CM | POA: Diagnosis not present

## 2016-06-05 DIAGNOSIS — N186 End stage renal disease: Secondary | ICD-10-CM | POA: Diagnosis not present

## 2016-06-05 DIAGNOSIS — N2581 Secondary hyperparathyroidism of renal origin: Secondary | ICD-10-CM | POA: Diagnosis not present

## 2016-06-07 DIAGNOSIS — N2581 Secondary hyperparathyroidism of renal origin: Secondary | ICD-10-CM | POA: Diagnosis not present

## 2016-06-07 DIAGNOSIS — Z992 Dependence on renal dialysis: Secondary | ICD-10-CM | POA: Diagnosis not present

## 2016-06-07 DIAGNOSIS — D509 Iron deficiency anemia, unspecified: Secondary | ICD-10-CM | POA: Diagnosis not present

## 2016-06-07 DIAGNOSIS — N186 End stage renal disease: Secondary | ICD-10-CM | POA: Diagnosis not present

## 2016-06-08 DIAGNOSIS — J449 Chronic obstructive pulmonary disease, unspecified: Secondary | ICD-10-CM | POA: Diagnosis not present

## 2016-06-08 DIAGNOSIS — Z992 Dependence on renal dialysis: Secondary | ICD-10-CM | POA: Diagnosis not present

## 2016-06-08 DIAGNOSIS — I739 Peripheral vascular disease, unspecified: Secondary | ICD-10-CM | POA: Diagnosis not present

## 2016-06-08 DIAGNOSIS — N186 End stage renal disease: Secondary | ICD-10-CM | POA: Diagnosis not present

## 2016-06-08 DIAGNOSIS — E440 Moderate protein-calorie malnutrition: Secondary | ICD-10-CM | POA: Diagnosis not present

## 2016-06-08 DIAGNOSIS — F17200 Nicotine dependence, unspecified, uncomplicated: Secondary | ICD-10-CM | POA: Diagnosis not present

## 2016-06-09 DIAGNOSIS — D631 Anemia in chronic kidney disease: Secondary | ICD-10-CM | POA: Diagnosis not present

## 2016-06-09 DIAGNOSIS — N186 End stage renal disease: Secondary | ICD-10-CM | POA: Diagnosis not present

## 2016-06-09 DIAGNOSIS — Z992 Dependence on renal dialysis: Secondary | ICD-10-CM | POA: Diagnosis not present

## 2016-06-09 DIAGNOSIS — N2581 Secondary hyperparathyroidism of renal origin: Secondary | ICD-10-CM | POA: Diagnosis not present

## 2016-06-09 DIAGNOSIS — D509 Iron deficiency anemia, unspecified: Secondary | ICD-10-CM | POA: Diagnosis not present

## 2016-06-12 DIAGNOSIS — D509 Iron deficiency anemia, unspecified: Secondary | ICD-10-CM | POA: Diagnosis not present

## 2016-06-12 DIAGNOSIS — N186 End stage renal disease: Secondary | ICD-10-CM | POA: Diagnosis not present

## 2016-06-12 DIAGNOSIS — N2581 Secondary hyperparathyroidism of renal origin: Secondary | ICD-10-CM | POA: Diagnosis not present

## 2016-06-12 DIAGNOSIS — Z992 Dependence on renal dialysis: Secondary | ICD-10-CM | POA: Diagnosis not present

## 2016-06-12 DIAGNOSIS — D631 Anemia in chronic kidney disease: Secondary | ICD-10-CM | POA: Diagnosis not present

## 2016-06-14 DIAGNOSIS — N186 End stage renal disease: Secondary | ICD-10-CM | POA: Diagnosis not present

## 2016-06-14 DIAGNOSIS — D631 Anemia in chronic kidney disease: Secondary | ICD-10-CM | POA: Diagnosis not present

## 2016-06-14 DIAGNOSIS — D509 Iron deficiency anemia, unspecified: Secondary | ICD-10-CM | POA: Diagnosis not present

## 2016-06-14 DIAGNOSIS — Z992 Dependence on renal dialysis: Secondary | ICD-10-CM | POA: Diagnosis not present

## 2016-06-14 DIAGNOSIS — N2581 Secondary hyperparathyroidism of renal origin: Secondary | ICD-10-CM | POA: Diagnosis not present

## 2016-06-16 DIAGNOSIS — N2581 Secondary hyperparathyroidism of renal origin: Secondary | ICD-10-CM | POA: Diagnosis not present

## 2016-06-16 DIAGNOSIS — D631 Anemia in chronic kidney disease: Secondary | ICD-10-CM | POA: Diagnosis not present

## 2016-06-16 DIAGNOSIS — D509 Iron deficiency anemia, unspecified: Secondary | ICD-10-CM | POA: Diagnosis not present

## 2016-06-16 DIAGNOSIS — Z992 Dependence on renal dialysis: Secondary | ICD-10-CM | POA: Diagnosis not present

## 2016-06-16 DIAGNOSIS — N186 End stage renal disease: Secondary | ICD-10-CM | POA: Diagnosis not present

## 2016-06-19 DIAGNOSIS — N2581 Secondary hyperparathyroidism of renal origin: Secondary | ICD-10-CM | POA: Diagnosis not present

## 2016-06-19 DIAGNOSIS — D509 Iron deficiency anemia, unspecified: Secondary | ICD-10-CM | POA: Diagnosis not present

## 2016-06-19 DIAGNOSIS — N186 End stage renal disease: Secondary | ICD-10-CM | POA: Diagnosis not present

## 2016-06-19 DIAGNOSIS — D631 Anemia in chronic kidney disease: Secondary | ICD-10-CM | POA: Diagnosis not present

## 2016-06-19 DIAGNOSIS — Z992 Dependence on renal dialysis: Secondary | ICD-10-CM | POA: Diagnosis not present

## 2016-06-21 DIAGNOSIS — Z992 Dependence on renal dialysis: Secondary | ICD-10-CM | POA: Diagnosis not present

## 2016-06-21 DIAGNOSIS — D631 Anemia in chronic kidney disease: Secondary | ICD-10-CM | POA: Diagnosis not present

## 2016-06-21 DIAGNOSIS — D509 Iron deficiency anemia, unspecified: Secondary | ICD-10-CM | POA: Diagnosis not present

## 2016-06-21 DIAGNOSIS — N186 End stage renal disease: Secondary | ICD-10-CM | POA: Diagnosis not present

## 2016-06-21 DIAGNOSIS — N2581 Secondary hyperparathyroidism of renal origin: Secondary | ICD-10-CM | POA: Diagnosis not present

## 2016-06-23 DIAGNOSIS — N186 End stage renal disease: Secondary | ICD-10-CM | POA: Diagnosis not present

## 2016-06-23 DIAGNOSIS — D631 Anemia in chronic kidney disease: Secondary | ICD-10-CM | POA: Diagnosis not present

## 2016-06-23 DIAGNOSIS — N2581 Secondary hyperparathyroidism of renal origin: Secondary | ICD-10-CM | POA: Diagnosis not present

## 2016-06-23 DIAGNOSIS — Z992 Dependence on renal dialysis: Secondary | ICD-10-CM | POA: Diagnosis not present

## 2016-06-23 DIAGNOSIS — D509 Iron deficiency anemia, unspecified: Secondary | ICD-10-CM | POA: Diagnosis not present

## 2016-06-26 DIAGNOSIS — N2581 Secondary hyperparathyroidism of renal origin: Secondary | ICD-10-CM | POA: Diagnosis not present

## 2016-06-26 DIAGNOSIS — Z992 Dependence on renal dialysis: Secondary | ICD-10-CM | POA: Diagnosis not present

## 2016-06-26 DIAGNOSIS — D509 Iron deficiency anemia, unspecified: Secondary | ICD-10-CM | POA: Diagnosis not present

## 2016-06-26 DIAGNOSIS — D631 Anemia in chronic kidney disease: Secondary | ICD-10-CM | POA: Diagnosis not present

## 2016-06-26 DIAGNOSIS — N186 End stage renal disease: Secondary | ICD-10-CM | POA: Diagnosis not present

## 2016-06-28 DIAGNOSIS — N186 End stage renal disease: Secondary | ICD-10-CM | POA: Diagnosis not present

## 2016-06-28 DIAGNOSIS — Z992 Dependence on renal dialysis: Secondary | ICD-10-CM | POA: Diagnosis not present

## 2016-06-28 DIAGNOSIS — D509 Iron deficiency anemia, unspecified: Secondary | ICD-10-CM | POA: Diagnosis not present

## 2016-06-28 DIAGNOSIS — D631 Anemia in chronic kidney disease: Secondary | ICD-10-CM | POA: Diagnosis not present

## 2016-06-28 DIAGNOSIS — N2581 Secondary hyperparathyroidism of renal origin: Secondary | ICD-10-CM | POA: Diagnosis not present

## 2016-06-30 DIAGNOSIS — D509 Iron deficiency anemia, unspecified: Secondary | ICD-10-CM | POA: Diagnosis not present

## 2016-06-30 DIAGNOSIS — D631 Anemia in chronic kidney disease: Secondary | ICD-10-CM | POA: Diagnosis not present

## 2016-06-30 DIAGNOSIS — N2581 Secondary hyperparathyroidism of renal origin: Secondary | ICD-10-CM | POA: Diagnosis not present

## 2016-06-30 DIAGNOSIS — Z992 Dependence on renal dialysis: Secondary | ICD-10-CM | POA: Diagnosis not present

## 2016-06-30 DIAGNOSIS — N186 End stage renal disease: Secondary | ICD-10-CM | POA: Diagnosis not present

## 2016-07-02 DIAGNOSIS — Z992 Dependence on renal dialysis: Secondary | ICD-10-CM | POA: Diagnosis not present

## 2016-07-02 DIAGNOSIS — D509 Iron deficiency anemia, unspecified: Secondary | ICD-10-CM | POA: Diagnosis not present

## 2016-07-02 DIAGNOSIS — N2581 Secondary hyperparathyroidism of renal origin: Secondary | ICD-10-CM | POA: Diagnosis not present

## 2016-07-02 DIAGNOSIS — N186 End stage renal disease: Secondary | ICD-10-CM | POA: Diagnosis not present

## 2016-07-02 DIAGNOSIS — D631 Anemia in chronic kidney disease: Secondary | ICD-10-CM | POA: Diagnosis not present

## 2016-07-05 DIAGNOSIS — N186 End stage renal disease: Secondary | ICD-10-CM | POA: Diagnosis not present

## 2016-07-05 DIAGNOSIS — D631 Anemia in chronic kidney disease: Secondary | ICD-10-CM | POA: Diagnosis not present

## 2016-07-05 DIAGNOSIS — Z992 Dependence on renal dialysis: Secondary | ICD-10-CM | POA: Diagnosis not present

## 2016-07-05 DIAGNOSIS — D509 Iron deficiency anemia, unspecified: Secondary | ICD-10-CM | POA: Diagnosis not present

## 2016-07-05 DIAGNOSIS — N2581 Secondary hyperparathyroidism of renal origin: Secondary | ICD-10-CM | POA: Diagnosis not present

## 2016-07-07 DIAGNOSIS — N186 End stage renal disease: Secondary | ICD-10-CM | POA: Diagnosis not present

## 2016-07-07 DIAGNOSIS — D631 Anemia in chronic kidney disease: Secondary | ICD-10-CM | POA: Diagnosis not present

## 2016-07-07 DIAGNOSIS — N2581 Secondary hyperparathyroidism of renal origin: Secondary | ICD-10-CM | POA: Diagnosis not present

## 2016-07-07 DIAGNOSIS — D509 Iron deficiency anemia, unspecified: Secondary | ICD-10-CM | POA: Diagnosis not present

## 2016-07-07 DIAGNOSIS — Z992 Dependence on renal dialysis: Secondary | ICD-10-CM | POA: Diagnosis not present

## 2016-07-09 DIAGNOSIS — Z992 Dependence on renal dialysis: Secondary | ICD-10-CM | POA: Diagnosis not present

## 2016-07-09 DIAGNOSIS — N186 End stage renal disease: Secondary | ICD-10-CM | POA: Diagnosis not present

## 2016-07-10 DIAGNOSIS — D509 Iron deficiency anemia, unspecified: Secondary | ICD-10-CM | POA: Diagnosis not present

## 2016-07-10 DIAGNOSIS — N2581 Secondary hyperparathyroidism of renal origin: Secondary | ICD-10-CM | POA: Diagnosis not present

## 2016-07-10 DIAGNOSIS — Z992 Dependence on renal dialysis: Secondary | ICD-10-CM | POA: Diagnosis not present

## 2016-07-10 DIAGNOSIS — N186 End stage renal disease: Secondary | ICD-10-CM | POA: Diagnosis not present

## 2016-07-10 DIAGNOSIS — Z23 Encounter for immunization: Secondary | ICD-10-CM | POA: Diagnosis not present

## 2016-07-12 DIAGNOSIS — Z992 Dependence on renal dialysis: Secondary | ICD-10-CM | POA: Diagnosis not present

## 2016-07-12 DIAGNOSIS — N186 End stage renal disease: Secondary | ICD-10-CM | POA: Diagnosis not present

## 2016-07-12 DIAGNOSIS — D509 Iron deficiency anemia, unspecified: Secondary | ICD-10-CM | POA: Diagnosis not present

## 2016-07-12 DIAGNOSIS — N2581 Secondary hyperparathyroidism of renal origin: Secondary | ICD-10-CM | POA: Diagnosis not present

## 2016-07-12 DIAGNOSIS — Z23 Encounter for immunization: Secondary | ICD-10-CM | POA: Diagnosis not present

## 2016-07-14 DIAGNOSIS — N2581 Secondary hyperparathyroidism of renal origin: Secondary | ICD-10-CM | POA: Diagnosis not present

## 2016-07-14 DIAGNOSIS — N186 End stage renal disease: Secondary | ICD-10-CM | POA: Diagnosis not present

## 2016-07-14 DIAGNOSIS — Z23 Encounter for immunization: Secondary | ICD-10-CM | POA: Diagnosis not present

## 2016-07-14 DIAGNOSIS — D509 Iron deficiency anemia, unspecified: Secondary | ICD-10-CM | POA: Diagnosis not present

## 2016-07-14 DIAGNOSIS — Z992 Dependence on renal dialysis: Secondary | ICD-10-CM | POA: Diagnosis not present

## 2016-07-17 DIAGNOSIS — N2581 Secondary hyperparathyroidism of renal origin: Secondary | ICD-10-CM | POA: Diagnosis not present

## 2016-07-17 DIAGNOSIS — Z23 Encounter for immunization: Secondary | ICD-10-CM | POA: Diagnosis not present

## 2016-07-17 DIAGNOSIS — N186 End stage renal disease: Secondary | ICD-10-CM | POA: Diagnosis not present

## 2016-07-17 DIAGNOSIS — D509 Iron deficiency anemia, unspecified: Secondary | ICD-10-CM | POA: Diagnosis not present

## 2016-07-17 DIAGNOSIS — Z992 Dependence on renal dialysis: Secondary | ICD-10-CM | POA: Diagnosis not present

## 2016-07-19 DIAGNOSIS — N186 End stage renal disease: Secondary | ICD-10-CM | POA: Diagnosis not present

## 2016-07-19 DIAGNOSIS — Z992 Dependence on renal dialysis: Secondary | ICD-10-CM | POA: Diagnosis not present

## 2016-07-19 DIAGNOSIS — Z23 Encounter for immunization: Secondary | ICD-10-CM | POA: Diagnosis not present

## 2016-07-19 DIAGNOSIS — D509 Iron deficiency anemia, unspecified: Secondary | ICD-10-CM | POA: Diagnosis not present

## 2016-07-19 DIAGNOSIS — N2581 Secondary hyperparathyroidism of renal origin: Secondary | ICD-10-CM | POA: Diagnosis not present

## 2016-07-21 DIAGNOSIS — D509 Iron deficiency anemia, unspecified: Secondary | ICD-10-CM | POA: Diagnosis not present

## 2016-07-21 DIAGNOSIS — Z23 Encounter for immunization: Secondary | ICD-10-CM | POA: Diagnosis not present

## 2016-07-21 DIAGNOSIS — N2581 Secondary hyperparathyroidism of renal origin: Secondary | ICD-10-CM | POA: Diagnosis not present

## 2016-07-21 DIAGNOSIS — N186 End stage renal disease: Secondary | ICD-10-CM | POA: Diagnosis not present

## 2016-07-21 DIAGNOSIS — Z992 Dependence on renal dialysis: Secondary | ICD-10-CM | POA: Diagnosis not present

## 2016-07-24 DIAGNOSIS — N186 End stage renal disease: Secondary | ICD-10-CM | POA: Diagnosis not present

## 2016-07-24 DIAGNOSIS — Z992 Dependence on renal dialysis: Secondary | ICD-10-CM | POA: Diagnosis not present

## 2016-07-24 DIAGNOSIS — Z23 Encounter for immunization: Secondary | ICD-10-CM | POA: Diagnosis not present

## 2016-07-24 DIAGNOSIS — D509 Iron deficiency anemia, unspecified: Secondary | ICD-10-CM | POA: Diagnosis not present

## 2016-07-24 DIAGNOSIS — N2581 Secondary hyperparathyroidism of renal origin: Secondary | ICD-10-CM | POA: Diagnosis not present

## 2016-07-27 DIAGNOSIS — N2581 Secondary hyperparathyroidism of renal origin: Secondary | ICD-10-CM | POA: Diagnosis not present

## 2016-07-27 DIAGNOSIS — Z992 Dependence on renal dialysis: Secondary | ICD-10-CM | POA: Diagnosis not present

## 2016-07-27 DIAGNOSIS — Z23 Encounter for immunization: Secondary | ICD-10-CM | POA: Diagnosis not present

## 2016-07-27 DIAGNOSIS — N186 End stage renal disease: Secondary | ICD-10-CM | POA: Diagnosis not present

## 2016-07-27 DIAGNOSIS — D509 Iron deficiency anemia, unspecified: Secondary | ICD-10-CM | POA: Diagnosis not present

## 2016-07-28 DIAGNOSIS — N186 End stage renal disease: Secondary | ICD-10-CM | POA: Diagnosis not present

## 2016-07-28 DIAGNOSIS — Z992 Dependence on renal dialysis: Secondary | ICD-10-CM | POA: Diagnosis not present

## 2016-07-28 DIAGNOSIS — Z23 Encounter for immunization: Secondary | ICD-10-CM | POA: Diagnosis not present

## 2016-07-28 DIAGNOSIS — D509 Iron deficiency anemia, unspecified: Secondary | ICD-10-CM | POA: Diagnosis not present

## 2016-07-28 DIAGNOSIS — N2581 Secondary hyperparathyroidism of renal origin: Secondary | ICD-10-CM | POA: Diagnosis not present

## 2016-07-31 DIAGNOSIS — Z23 Encounter for immunization: Secondary | ICD-10-CM | POA: Diagnosis not present

## 2016-07-31 DIAGNOSIS — N2581 Secondary hyperparathyroidism of renal origin: Secondary | ICD-10-CM | POA: Diagnosis not present

## 2016-07-31 DIAGNOSIS — N186 End stage renal disease: Secondary | ICD-10-CM | POA: Diagnosis not present

## 2016-07-31 DIAGNOSIS — Z992 Dependence on renal dialysis: Secondary | ICD-10-CM | POA: Diagnosis not present

## 2016-07-31 DIAGNOSIS — D509 Iron deficiency anemia, unspecified: Secondary | ICD-10-CM | POA: Diagnosis not present

## 2016-08-02 DIAGNOSIS — D509 Iron deficiency anemia, unspecified: Secondary | ICD-10-CM | POA: Diagnosis not present

## 2016-08-02 DIAGNOSIS — Z23 Encounter for immunization: Secondary | ICD-10-CM | POA: Diagnosis not present

## 2016-08-02 DIAGNOSIS — N2581 Secondary hyperparathyroidism of renal origin: Secondary | ICD-10-CM | POA: Diagnosis not present

## 2016-08-02 DIAGNOSIS — Z992 Dependence on renal dialysis: Secondary | ICD-10-CM | POA: Diagnosis not present

## 2016-08-02 DIAGNOSIS — N186 End stage renal disease: Secondary | ICD-10-CM | POA: Diagnosis not present

## 2016-08-04 DIAGNOSIS — Z23 Encounter for immunization: Secondary | ICD-10-CM | POA: Diagnosis not present

## 2016-08-04 DIAGNOSIS — N186 End stage renal disease: Secondary | ICD-10-CM | POA: Diagnosis not present

## 2016-08-04 DIAGNOSIS — N2581 Secondary hyperparathyroidism of renal origin: Secondary | ICD-10-CM | POA: Diagnosis not present

## 2016-08-04 DIAGNOSIS — Z992 Dependence on renal dialysis: Secondary | ICD-10-CM | POA: Diagnosis not present

## 2016-08-04 DIAGNOSIS — D509 Iron deficiency anemia, unspecified: Secondary | ICD-10-CM | POA: Diagnosis not present

## 2016-08-07 DIAGNOSIS — D509 Iron deficiency anemia, unspecified: Secondary | ICD-10-CM | POA: Diagnosis not present

## 2016-08-07 DIAGNOSIS — N2581 Secondary hyperparathyroidism of renal origin: Secondary | ICD-10-CM | POA: Diagnosis not present

## 2016-08-07 DIAGNOSIS — N186 End stage renal disease: Secondary | ICD-10-CM | POA: Diagnosis not present

## 2016-08-07 DIAGNOSIS — Z992 Dependence on renal dialysis: Secondary | ICD-10-CM | POA: Diagnosis not present

## 2016-08-07 DIAGNOSIS — Z23 Encounter for immunization: Secondary | ICD-10-CM | POA: Diagnosis not present

## 2016-08-09 DIAGNOSIS — Z23 Encounter for immunization: Secondary | ICD-10-CM | POA: Diagnosis not present

## 2016-08-09 DIAGNOSIS — N2581 Secondary hyperparathyroidism of renal origin: Secondary | ICD-10-CM | POA: Diagnosis not present

## 2016-08-09 DIAGNOSIS — Z992 Dependence on renal dialysis: Secondary | ICD-10-CM | POA: Diagnosis not present

## 2016-08-09 DIAGNOSIS — D509 Iron deficiency anemia, unspecified: Secondary | ICD-10-CM | POA: Diagnosis not present

## 2016-08-09 DIAGNOSIS — N186 End stage renal disease: Secondary | ICD-10-CM | POA: Diagnosis not present

## 2016-08-11 DIAGNOSIS — Z992 Dependence on renal dialysis: Secondary | ICD-10-CM | POA: Diagnosis not present

## 2016-08-11 DIAGNOSIS — D631 Anemia in chronic kidney disease: Secondary | ICD-10-CM | POA: Diagnosis not present

## 2016-08-11 DIAGNOSIS — N2581 Secondary hyperparathyroidism of renal origin: Secondary | ICD-10-CM | POA: Diagnosis not present

## 2016-08-11 DIAGNOSIS — D509 Iron deficiency anemia, unspecified: Secondary | ICD-10-CM | POA: Diagnosis not present

## 2016-08-11 DIAGNOSIS — N186 End stage renal disease: Secondary | ICD-10-CM | POA: Diagnosis not present

## 2016-08-14 DIAGNOSIS — D631 Anemia in chronic kidney disease: Secondary | ICD-10-CM | POA: Diagnosis not present

## 2016-08-14 DIAGNOSIS — D509 Iron deficiency anemia, unspecified: Secondary | ICD-10-CM | POA: Diagnosis not present

## 2016-08-14 DIAGNOSIS — N2581 Secondary hyperparathyroidism of renal origin: Secondary | ICD-10-CM | POA: Diagnosis not present

## 2016-08-14 DIAGNOSIS — Z992 Dependence on renal dialysis: Secondary | ICD-10-CM | POA: Diagnosis not present

## 2016-08-14 DIAGNOSIS — N186 End stage renal disease: Secondary | ICD-10-CM | POA: Diagnosis not present

## 2016-08-16 DIAGNOSIS — Z992 Dependence on renal dialysis: Secondary | ICD-10-CM | POA: Diagnosis not present

## 2016-08-16 DIAGNOSIS — D631 Anemia in chronic kidney disease: Secondary | ICD-10-CM | POA: Diagnosis not present

## 2016-08-16 DIAGNOSIS — D509 Iron deficiency anemia, unspecified: Secondary | ICD-10-CM | POA: Diagnosis not present

## 2016-08-16 DIAGNOSIS — N2581 Secondary hyperparathyroidism of renal origin: Secondary | ICD-10-CM | POA: Diagnosis not present

## 2016-08-16 DIAGNOSIS — N186 End stage renal disease: Secondary | ICD-10-CM | POA: Diagnosis not present

## 2016-08-18 DIAGNOSIS — D509 Iron deficiency anemia, unspecified: Secondary | ICD-10-CM | POA: Diagnosis not present

## 2016-08-18 DIAGNOSIS — Z992 Dependence on renal dialysis: Secondary | ICD-10-CM | POA: Diagnosis not present

## 2016-08-18 DIAGNOSIS — D631 Anemia in chronic kidney disease: Secondary | ICD-10-CM | POA: Diagnosis not present

## 2016-08-18 DIAGNOSIS — N2581 Secondary hyperparathyroidism of renal origin: Secondary | ICD-10-CM | POA: Diagnosis not present

## 2016-08-18 DIAGNOSIS — N186 End stage renal disease: Secondary | ICD-10-CM | POA: Diagnosis not present

## 2016-08-21 DIAGNOSIS — D509 Iron deficiency anemia, unspecified: Secondary | ICD-10-CM | POA: Diagnosis not present

## 2016-08-21 DIAGNOSIS — D631 Anemia in chronic kidney disease: Secondary | ICD-10-CM | POA: Diagnosis not present

## 2016-08-21 DIAGNOSIS — Z992 Dependence on renal dialysis: Secondary | ICD-10-CM | POA: Diagnosis not present

## 2016-08-21 DIAGNOSIS — N2581 Secondary hyperparathyroidism of renal origin: Secondary | ICD-10-CM | POA: Diagnosis not present

## 2016-08-21 DIAGNOSIS — N186 End stage renal disease: Secondary | ICD-10-CM | POA: Diagnosis not present

## 2016-08-23 DIAGNOSIS — N186 End stage renal disease: Secondary | ICD-10-CM | POA: Diagnosis not present

## 2016-08-23 DIAGNOSIS — Z992 Dependence on renal dialysis: Secondary | ICD-10-CM | POA: Diagnosis not present

## 2016-08-23 DIAGNOSIS — D509 Iron deficiency anemia, unspecified: Secondary | ICD-10-CM | POA: Diagnosis not present

## 2016-08-23 DIAGNOSIS — D631 Anemia in chronic kidney disease: Secondary | ICD-10-CM | POA: Diagnosis not present

## 2016-08-23 DIAGNOSIS — N2581 Secondary hyperparathyroidism of renal origin: Secondary | ICD-10-CM | POA: Diagnosis not present

## 2016-08-25 DIAGNOSIS — N2581 Secondary hyperparathyroidism of renal origin: Secondary | ICD-10-CM | POA: Diagnosis not present

## 2016-08-25 DIAGNOSIS — Z992 Dependence on renal dialysis: Secondary | ICD-10-CM | POA: Diagnosis not present

## 2016-08-25 DIAGNOSIS — D631 Anemia in chronic kidney disease: Secondary | ICD-10-CM | POA: Diagnosis not present

## 2016-08-25 DIAGNOSIS — D509 Iron deficiency anemia, unspecified: Secondary | ICD-10-CM | POA: Diagnosis not present

## 2016-08-25 DIAGNOSIS — N186 End stage renal disease: Secondary | ICD-10-CM | POA: Diagnosis not present

## 2016-08-28 DIAGNOSIS — D509 Iron deficiency anemia, unspecified: Secondary | ICD-10-CM | POA: Diagnosis not present

## 2016-08-28 DIAGNOSIS — N2581 Secondary hyperparathyroidism of renal origin: Secondary | ICD-10-CM | POA: Diagnosis not present

## 2016-08-28 DIAGNOSIS — Z992 Dependence on renal dialysis: Secondary | ICD-10-CM | POA: Diagnosis not present

## 2016-08-28 DIAGNOSIS — N186 End stage renal disease: Secondary | ICD-10-CM | POA: Diagnosis not present

## 2016-08-28 DIAGNOSIS — D631 Anemia in chronic kidney disease: Secondary | ICD-10-CM | POA: Diagnosis not present

## 2016-08-30 DIAGNOSIS — N2581 Secondary hyperparathyroidism of renal origin: Secondary | ICD-10-CM | POA: Diagnosis not present

## 2016-08-30 DIAGNOSIS — Z992 Dependence on renal dialysis: Secondary | ICD-10-CM | POA: Diagnosis not present

## 2016-08-30 DIAGNOSIS — D509 Iron deficiency anemia, unspecified: Secondary | ICD-10-CM | POA: Diagnosis not present

## 2016-08-30 DIAGNOSIS — D631 Anemia in chronic kidney disease: Secondary | ICD-10-CM | POA: Diagnosis not present

## 2016-08-30 DIAGNOSIS — N186 End stage renal disease: Secondary | ICD-10-CM | POA: Diagnosis not present

## 2016-09-01 DIAGNOSIS — N186 End stage renal disease: Secondary | ICD-10-CM | POA: Diagnosis not present

## 2016-09-01 DIAGNOSIS — Z992 Dependence on renal dialysis: Secondary | ICD-10-CM | POA: Diagnosis not present

## 2016-09-01 DIAGNOSIS — D509 Iron deficiency anemia, unspecified: Secondary | ICD-10-CM | POA: Diagnosis not present

## 2016-09-01 DIAGNOSIS — D631 Anemia in chronic kidney disease: Secondary | ICD-10-CM | POA: Diagnosis not present

## 2016-09-01 DIAGNOSIS — N2581 Secondary hyperparathyroidism of renal origin: Secondary | ICD-10-CM | POA: Diagnosis not present

## 2016-09-04 DIAGNOSIS — N186 End stage renal disease: Secondary | ICD-10-CM | POA: Diagnosis not present

## 2016-09-04 DIAGNOSIS — Z992 Dependence on renal dialysis: Secondary | ICD-10-CM | POA: Diagnosis not present

## 2016-09-04 DIAGNOSIS — D509 Iron deficiency anemia, unspecified: Secondary | ICD-10-CM | POA: Diagnosis not present

## 2016-09-04 DIAGNOSIS — N2581 Secondary hyperparathyroidism of renal origin: Secondary | ICD-10-CM | POA: Diagnosis not present

## 2016-09-04 DIAGNOSIS — D631 Anemia in chronic kidney disease: Secondary | ICD-10-CM | POA: Diagnosis not present

## 2016-09-06 DIAGNOSIS — D509 Iron deficiency anemia, unspecified: Secondary | ICD-10-CM | POA: Diagnosis not present

## 2016-09-06 DIAGNOSIS — D631 Anemia in chronic kidney disease: Secondary | ICD-10-CM | POA: Diagnosis not present

## 2016-09-06 DIAGNOSIS — N186 End stage renal disease: Secondary | ICD-10-CM | POA: Diagnosis not present

## 2016-09-06 DIAGNOSIS — N2581 Secondary hyperparathyroidism of renal origin: Secondary | ICD-10-CM | POA: Diagnosis not present

## 2016-09-06 DIAGNOSIS — Z992 Dependence on renal dialysis: Secondary | ICD-10-CM | POA: Diagnosis not present

## 2016-09-08 DIAGNOSIS — D631 Anemia in chronic kidney disease: Secondary | ICD-10-CM | POA: Diagnosis not present

## 2016-09-08 DIAGNOSIS — N186 End stage renal disease: Secondary | ICD-10-CM | POA: Diagnosis not present

## 2016-09-08 DIAGNOSIS — D509 Iron deficiency anemia, unspecified: Secondary | ICD-10-CM | POA: Diagnosis not present

## 2016-09-08 DIAGNOSIS — N2581 Secondary hyperparathyroidism of renal origin: Secondary | ICD-10-CM | POA: Diagnosis not present

## 2016-09-08 DIAGNOSIS — Z992 Dependence on renal dialysis: Secondary | ICD-10-CM | POA: Diagnosis not present

## 2016-09-11 DIAGNOSIS — Z992 Dependence on renal dialysis: Secondary | ICD-10-CM | POA: Diagnosis not present

## 2016-09-11 DIAGNOSIS — D631 Anemia in chronic kidney disease: Secondary | ICD-10-CM | POA: Diagnosis not present

## 2016-09-11 DIAGNOSIS — N2581 Secondary hyperparathyroidism of renal origin: Secondary | ICD-10-CM | POA: Diagnosis not present

## 2016-09-11 DIAGNOSIS — N186 End stage renal disease: Secondary | ICD-10-CM | POA: Diagnosis not present

## 2016-09-11 DIAGNOSIS — D509 Iron deficiency anemia, unspecified: Secondary | ICD-10-CM | POA: Diagnosis not present

## 2016-09-12 DIAGNOSIS — J410 Simple chronic bronchitis: Secondary | ICD-10-CM | POA: Diagnosis not present

## 2016-09-12 DIAGNOSIS — R627 Adult failure to thrive: Secondary | ICD-10-CM | POA: Diagnosis not present

## 2016-09-12 DIAGNOSIS — I739 Peripheral vascular disease, unspecified: Secondary | ICD-10-CM | POA: Diagnosis not present

## 2016-09-12 DIAGNOSIS — N186 End stage renal disease: Secondary | ICD-10-CM | POA: Diagnosis not present

## 2016-09-13 DIAGNOSIS — N2581 Secondary hyperparathyroidism of renal origin: Secondary | ICD-10-CM | POA: Diagnosis not present

## 2016-09-13 DIAGNOSIS — N186 End stage renal disease: Secondary | ICD-10-CM | POA: Diagnosis not present

## 2016-09-13 DIAGNOSIS — Z992 Dependence on renal dialysis: Secondary | ICD-10-CM | POA: Diagnosis not present

## 2016-09-13 DIAGNOSIS — D509 Iron deficiency anemia, unspecified: Secondary | ICD-10-CM | POA: Diagnosis not present

## 2016-09-13 DIAGNOSIS — D631 Anemia in chronic kidney disease: Secondary | ICD-10-CM | POA: Diagnosis not present

## 2016-09-15 DIAGNOSIS — Z992 Dependence on renal dialysis: Secondary | ICD-10-CM | POA: Diagnosis not present

## 2016-09-15 DIAGNOSIS — D509 Iron deficiency anemia, unspecified: Secondary | ICD-10-CM | POA: Diagnosis not present

## 2016-09-15 DIAGNOSIS — N186 End stage renal disease: Secondary | ICD-10-CM | POA: Diagnosis not present

## 2016-09-15 DIAGNOSIS — D631 Anemia in chronic kidney disease: Secondary | ICD-10-CM | POA: Diagnosis not present

## 2016-09-15 DIAGNOSIS — N2581 Secondary hyperparathyroidism of renal origin: Secondary | ICD-10-CM | POA: Diagnosis not present

## 2016-09-18 DIAGNOSIS — D509 Iron deficiency anemia, unspecified: Secondary | ICD-10-CM | POA: Diagnosis not present

## 2016-09-18 DIAGNOSIS — N186 End stage renal disease: Secondary | ICD-10-CM | POA: Diagnosis not present

## 2016-09-18 DIAGNOSIS — N2581 Secondary hyperparathyroidism of renal origin: Secondary | ICD-10-CM | POA: Diagnosis not present

## 2016-09-18 DIAGNOSIS — Z992 Dependence on renal dialysis: Secondary | ICD-10-CM | POA: Diagnosis not present

## 2016-09-18 DIAGNOSIS — D631 Anemia in chronic kidney disease: Secondary | ICD-10-CM | POA: Diagnosis not present

## 2016-09-20 DIAGNOSIS — Z992 Dependence on renal dialysis: Secondary | ICD-10-CM | POA: Diagnosis not present

## 2016-09-20 DIAGNOSIS — N2581 Secondary hyperparathyroidism of renal origin: Secondary | ICD-10-CM | POA: Diagnosis not present

## 2016-09-20 DIAGNOSIS — D509 Iron deficiency anemia, unspecified: Secondary | ICD-10-CM | POA: Diagnosis not present

## 2016-09-20 DIAGNOSIS — N186 End stage renal disease: Secondary | ICD-10-CM | POA: Diagnosis not present

## 2016-09-20 DIAGNOSIS — D631 Anemia in chronic kidney disease: Secondary | ICD-10-CM | POA: Diagnosis not present

## 2016-09-22 DIAGNOSIS — Z992 Dependence on renal dialysis: Secondary | ICD-10-CM | POA: Diagnosis not present

## 2016-09-22 DIAGNOSIS — N2581 Secondary hyperparathyroidism of renal origin: Secondary | ICD-10-CM | POA: Diagnosis not present

## 2016-09-22 DIAGNOSIS — N186 End stage renal disease: Secondary | ICD-10-CM | POA: Diagnosis not present

## 2016-09-22 DIAGNOSIS — D509 Iron deficiency anemia, unspecified: Secondary | ICD-10-CM | POA: Diagnosis not present

## 2016-09-22 DIAGNOSIS — D631 Anemia in chronic kidney disease: Secondary | ICD-10-CM | POA: Diagnosis not present

## 2016-09-25 DIAGNOSIS — D631 Anemia in chronic kidney disease: Secondary | ICD-10-CM | POA: Diagnosis not present

## 2016-09-25 DIAGNOSIS — D509 Iron deficiency anemia, unspecified: Secondary | ICD-10-CM | POA: Diagnosis not present

## 2016-09-25 DIAGNOSIS — Z992 Dependence on renal dialysis: Secondary | ICD-10-CM | POA: Diagnosis not present

## 2016-09-25 DIAGNOSIS — N186 End stage renal disease: Secondary | ICD-10-CM | POA: Diagnosis not present

## 2016-09-25 DIAGNOSIS — N2581 Secondary hyperparathyroidism of renal origin: Secondary | ICD-10-CM | POA: Diagnosis not present

## 2016-09-27 DIAGNOSIS — D509 Iron deficiency anemia, unspecified: Secondary | ICD-10-CM | POA: Diagnosis not present

## 2016-09-27 DIAGNOSIS — N2581 Secondary hyperparathyroidism of renal origin: Secondary | ICD-10-CM | POA: Diagnosis not present

## 2016-09-27 DIAGNOSIS — N186 End stage renal disease: Secondary | ICD-10-CM | POA: Diagnosis not present

## 2016-09-27 DIAGNOSIS — Z992 Dependence on renal dialysis: Secondary | ICD-10-CM | POA: Diagnosis not present

## 2016-09-27 DIAGNOSIS — D631 Anemia in chronic kidney disease: Secondary | ICD-10-CM | POA: Diagnosis not present

## 2016-09-29 DIAGNOSIS — N2581 Secondary hyperparathyroidism of renal origin: Secondary | ICD-10-CM | POA: Diagnosis not present

## 2016-09-29 DIAGNOSIS — Z992 Dependence on renal dialysis: Secondary | ICD-10-CM | POA: Diagnosis not present

## 2016-09-29 DIAGNOSIS — D509 Iron deficiency anemia, unspecified: Secondary | ICD-10-CM | POA: Diagnosis not present

## 2016-09-29 DIAGNOSIS — D631 Anemia in chronic kidney disease: Secondary | ICD-10-CM | POA: Diagnosis not present

## 2016-09-29 DIAGNOSIS — N186 End stage renal disease: Secondary | ICD-10-CM | POA: Diagnosis not present

## 2016-10-02 DIAGNOSIS — D509 Iron deficiency anemia, unspecified: Secondary | ICD-10-CM | POA: Diagnosis not present

## 2016-10-02 DIAGNOSIS — D631 Anemia in chronic kidney disease: Secondary | ICD-10-CM | POA: Diagnosis not present

## 2016-10-02 DIAGNOSIS — Z992 Dependence on renal dialysis: Secondary | ICD-10-CM | POA: Diagnosis not present

## 2016-10-02 DIAGNOSIS — N186 End stage renal disease: Secondary | ICD-10-CM | POA: Diagnosis not present

## 2016-10-02 DIAGNOSIS — N2581 Secondary hyperparathyroidism of renal origin: Secondary | ICD-10-CM | POA: Diagnosis not present

## 2016-10-04 DIAGNOSIS — D631 Anemia in chronic kidney disease: Secondary | ICD-10-CM | POA: Diagnosis not present

## 2016-10-04 DIAGNOSIS — N2581 Secondary hyperparathyroidism of renal origin: Secondary | ICD-10-CM | POA: Diagnosis not present

## 2016-10-04 DIAGNOSIS — N186 End stage renal disease: Secondary | ICD-10-CM | POA: Diagnosis not present

## 2016-10-04 DIAGNOSIS — Z992 Dependence on renal dialysis: Secondary | ICD-10-CM | POA: Diagnosis not present

## 2016-10-04 DIAGNOSIS — D509 Iron deficiency anemia, unspecified: Secondary | ICD-10-CM | POA: Diagnosis not present

## 2016-10-06 DIAGNOSIS — Z992 Dependence on renal dialysis: Secondary | ICD-10-CM | POA: Diagnosis not present

## 2016-10-06 DIAGNOSIS — N2581 Secondary hyperparathyroidism of renal origin: Secondary | ICD-10-CM | POA: Diagnosis not present

## 2016-10-06 DIAGNOSIS — N186 End stage renal disease: Secondary | ICD-10-CM | POA: Diagnosis not present

## 2016-10-06 DIAGNOSIS — D509 Iron deficiency anemia, unspecified: Secondary | ICD-10-CM | POA: Diagnosis not present

## 2016-10-06 DIAGNOSIS — D631 Anemia in chronic kidney disease: Secondary | ICD-10-CM | POA: Diagnosis not present

## 2016-10-07 DIAGNOSIS — Z992 Dependence on renal dialysis: Secondary | ICD-10-CM | POA: Diagnosis not present

## 2016-10-07 DIAGNOSIS — N186 End stage renal disease: Secondary | ICD-10-CM | POA: Diagnosis not present

## 2016-10-09 DIAGNOSIS — N186 End stage renal disease: Secondary | ICD-10-CM | POA: Diagnosis not present

## 2016-10-09 DIAGNOSIS — D631 Anemia in chronic kidney disease: Secondary | ICD-10-CM | POA: Diagnosis not present

## 2016-10-09 DIAGNOSIS — D509 Iron deficiency anemia, unspecified: Secondary | ICD-10-CM | POA: Diagnosis not present

## 2016-10-09 DIAGNOSIS — Z992 Dependence on renal dialysis: Secondary | ICD-10-CM | POA: Diagnosis not present

## 2016-10-09 DIAGNOSIS — N2581 Secondary hyperparathyroidism of renal origin: Secondary | ICD-10-CM | POA: Diagnosis not present

## 2016-10-11 DIAGNOSIS — N2581 Secondary hyperparathyroidism of renal origin: Secondary | ICD-10-CM | POA: Diagnosis not present

## 2016-10-11 DIAGNOSIS — D509 Iron deficiency anemia, unspecified: Secondary | ICD-10-CM | POA: Diagnosis not present

## 2016-10-11 DIAGNOSIS — D631 Anemia in chronic kidney disease: Secondary | ICD-10-CM | POA: Diagnosis not present

## 2016-10-11 DIAGNOSIS — N186 End stage renal disease: Secondary | ICD-10-CM | POA: Diagnosis not present

## 2016-10-11 DIAGNOSIS — Z992 Dependence on renal dialysis: Secondary | ICD-10-CM | POA: Diagnosis not present

## 2016-10-12 ENCOUNTER — Other Ambulatory Visit (HOSPITAL_COMMUNITY): Payer: Self-pay | Admitting: Pulmonary Disease

## 2016-10-12 ENCOUNTER — Ambulatory Visit (HOSPITAL_COMMUNITY)
Admission: RE | Admit: 2016-10-12 | Discharge: 2016-10-12 | Disposition: A | Discharge status: Home / Self Care | Payer: Medicaid Other | Source: Ambulatory Visit | Attending: Pulmonary Disease | Admitting: Pulmonary Disease

## 2016-10-12 DIAGNOSIS — J438 Other emphysema: Secondary | ICD-10-CM | POA: Diagnosis not present

## 2016-10-12 DIAGNOSIS — R918 Other nonspecific abnormal finding of lung field: Secondary | ICD-10-CM | POA: Insufficient documentation

## 2016-10-13 DIAGNOSIS — D631 Anemia in chronic kidney disease: Secondary | ICD-10-CM | POA: Diagnosis not present

## 2016-10-13 DIAGNOSIS — N2581 Secondary hyperparathyroidism of renal origin: Secondary | ICD-10-CM | POA: Diagnosis not present

## 2016-10-13 DIAGNOSIS — Z992 Dependence on renal dialysis: Secondary | ICD-10-CM | POA: Diagnosis not present

## 2016-10-13 DIAGNOSIS — D509 Iron deficiency anemia, unspecified: Secondary | ICD-10-CM | POA: Diagnosis not present

## 2016-10-13 DIAGNOSIS — N186 End stage renal disease: Secondary | ICD-10-CM | POA: Diagnosis not present

## 2016-10-16 DIAGNOSIS — D631 Anemia in chronic kidney disease: Secondary | ICD-10-CM | POA: Diagnosis not present

## 2016-10-16 DIAGNOSIS — N186 End stage renal disease: Secondary | ICD-10-CM | POA: Diagnosis not present

## 2016-10-16 DIAGNOSIS — D509 Iron deficiency anemia, unspecified: Secondary | ICD-10-CM | POA: Diagnosis not present

## 2016-10-16 DIAGNOSIS — N2581 Secondary hyperparathyroidism of renal origin: Secondary | ICD-10-CM | POA: Diagnosis not present

## 2016-10-16 DIAGNOSIS — Z992 Dependence on renal dialysis: Secondary | ICD-10-CM | POA: Diagnosis not present

## 2016-10-18 DIAGNOSIS — N2581 Secondary hyperparathyroidism of renal origin: Secondary | ICD-10-CM | POA: Diagnosis not present

## 2016-10-18 DIAGNOSIS — D631 Anemia in chronic kidney disease: Secondary | ICD-10-CM | POA: Diagnosis not present

## 2016-10-18 DIAGNOSIS — N186 End stage renal disease: Secondary | ICD-10-CM | POA: Diagnosis not present

## 2016-10-18 DIAGNOSIS — D509 Iron deficiency anemia, unspecified: Secondary | ICD-10-CM | POA: Diagnosis not present

## 2016-10-18 DIAGNOSIS — Z992 Dependence on renal dialysis: Secondary | ICD-10-CM | POA: Diagnosis not present

## 2016-10-19 DIAGNOSIS — M545 Low back pain: Secondary | ICD-10-CM | POA: Diagnosis not present

## 2016-10-19 DIAGNOSIS — J449 Chronic obstructive pulmonary disease, unspecified: Secondary | ICD-10-CM | POA: Diagnosis not present

## 2016-10-19 DIAGNOSIS — I120 Hypertensive chronic kidney disease with stage 5 chronic kidney disease or end stage renal disease: Secondary | ICD-10-CM | POA: Diagnosis not present

## 2016-10-19 DIAGNOSIS — N185 Chronic kidney disease, stage 5: Secondary | ICD-10-CM | POA: Diagnosis not present

## 2016-10-20 DIAGNOSIS — Z992 Dependence on renal dialysis: Secondary | ICD-10-CM | POA: Diagnosis not present

## 2016-10-20 DIAGNOSIS — N186 End stage renal disease: Secondary | ICD-10-CM | POA: Diagnosis not present

## 2016-10-20 DIAGNOSIS — D509 Iron deficiency anemia, unspecified: Secondary | ICD-10-CM | POA: Diagnosis not present

## 2016-10-20 DIAGNOSIS — N2581 Secondary hyperparathyroidism of renal origin: Secondary | ICD-10-CM | POA: Diagnosis not present

## 2016-10-20 DIAGNOSIS — D631 Anemia in chronic kidney disease: Secondary | ICD-10-CM | POA: Diagnosis not present

## 2016-10-23 DIAGNOSIS — D631 Anemia in chronic kidney disease: Secondary | ICD-10-CM | POA: Diagnosis not present

## 2016-10-23 DIAGNOSIS — N186 End stage renal disease: Secondary | ICD-10-CM | POA: Diagnosis not present

## 2016-10-23 DIAGNOSIS — N2581 Secondary hyperparathyroidism of renal origin: Secondary | ICD-10-CM | POA: Diagnosis not present

## 2016-10-23 DIAGNOSIS — D509 Iron deficiency anemia, unspecified: Secondary | ICD-10-CM | POA: Diagnosis not present

## 2016-10-23 DIAGNOSIS — Z992 Dependence on renal dialysis: Secondary | ICD-10-CM | POA: Diagnosis not present

## 2016-10-25 DIAGNOSIS — D509 Iron deficiency anemia, unspecified: Secondary | ICD-10-CM | POA: Diagnosis not present

## 2016-10-25 DIAGNOSIS — N186 End stage renal disease: Secondary | ICD-10-CM | POA: Diagnosis not present

## 2016-10-25 DIAGNOSIS — Z992 Dependence on renal dialysis: Secondary | ICD-10-CM | POA: Diagnosis not present

## 2016-10-25 DIAGNOSIS — D631 Anemia in chronic kidney disease: Secondary | ICD-10-CM | POA: Diagnosis not present

## 2016-10-25 DIAGNOSIS — N2581 Secondary hyperparathyroidism of renal origin: Secondary | ICD-10-CM | POA: Diagnosis not present

## 2016-10-27 DIAGNOSIS — D509 Iron deficiency anemia, unspecified: Secondary | ICD-10-CM | POA: Diagnosis not present

## 2016-10-27 DIAGNOSIS — N186 End stage renal disease: Secondary | ICD-10-CM | POA: Diagnosis not present

## 2016-10-27 DIAGNOSIS — D631 Anemia in chronic kidney disease: Secondary | ICD-10-CM | POA: Diagnosis not present

## 2016-10-27 DIAGNOSIS — N2581 Secondary hyperparathyroidism of renal origin: Secondary | ICD-10-CM | POA: Diagnosis not present

## 2016-10-27 DIAGNOSIS — Z992 Dependence on renal dialysis: Secondary | ICD-10-CM | POA: Diagnosis not present

## 2016-10-30 DIAGNOSIS — Z992 Dependence on renal dialysis: Secondary | ICD-10-CM | POA: Diagnosis not present

## 2016-10-30 DIAGNOSIS — N186 End stage renal disease: Secondary | ICD-10-CM | POA: Diagnosis not present

## 2016-10-30 DIAGNOSIS — N2581 Secondary hyperparathyroidism of renal origin: Secondary | ICD-10-CM | POA: Diagnosis not present

## 2016-10-30 DIAGNOSIS — D631 Anemia in chronic kidney disease: Secondary | ICD-10-CM | POA: Diagnosis not present

## 2016-10-30 DIAGNOSIS — D509 Iron deficiency anemia, unspecified: Secondary | ICD-10-CM | POA: Diagnosis not present

## 2016-11-01 DIAGNOSIS — D509 Iron deficiency anemia, unspecified: Secondary | ICD-10-CM | POA: Diagnosis not present

## 2016-11-01 DIAGNOSIS — N186 End stage renal disease: Secondary | ICD-10-CM | POA: Diagnosis not present

## 2016-11-01 DIAGNOSIS — D631 Anemia in chronic kidney disease: Secondary | ICD-10-CM | POA: Diagnosis not present

## 2016-11-01 DIAGNOSIS — N2581 Secondary hyperparathyroidism of renal origin: Secondary | ICD-10-CM | POA: Diagnosis not present

## 2016-11-01 DIAGNOSIS — Z992 Dependence on renal dialysis: Secondary | ICD-10-CM | POA: Diagnosis not present

## 2016-11-03 DIAGNOSIS — D509 Iron deficiency anemia, unspecified: Secondary | ICD-10-CM | POA: Diagnosis not present

## 2016-11-03 DIAGNOSIS — N186 End stage renal disease: Secondary | ICD-10-CM | POA: Diagnosis not present

## 2016-11-03 DIAGNOSIS — N2581 Secondary hyperparathyroidism of renal origin: Secondary | ICD-10-CM | POA: Diagnosis not present

## 2016-11-03 DIAGNOSIS — Z992 Dependence on renal dialysis: Secondary | ICD-10-CM | POA: Diagnosis not present

## 2016-11-03 DIAGNOSIS — D631 Anemia in chronic kidney disease: Secondary | ICD-10-CM | POA: Diagnosis not present

## 2016-11-06 DIAGNOSIS — D631 Anemia in chronic kidney disease: Secondary | ICD-10-CM | POA: Diagnosis not present

## 2016-11-06 DIAGNOSIS — D509 Iron deficiency anemia, unspecified: Secondary | ICD-10-CM | POA: Diagnosis not present

## 2016-11-06 DIAGNOSIS — Z992 Dependence on renal dialysis: Secondary | ICD-10-CM | POA: Diagnosis not present

## 2016-11-06 DIAGNOSIS — N2581 Secondary hyperparathyroidism of renal origin: Secondary | ICD-10-CM | POA: Diagnosis not present

## 2016-11-06 DIAGNOSIS — N186 End stage renal disease: Secondary | ICD-10-CM | POA: Diagnosis not present

## 2016-11-08 DIAGNOSIS — Z992 Dependence on renal dialysis: Secondary | ICD-10-CM | POA: Diagnosis not present

## 2016-11-08 DIAGNOSIS — N186 End stage renal disease: Secondary | ICD-10-CM | POA: Diagnosis not present

## 2016-11-08 DIAGNOSIS — D509 Iron deficiency anemia, unspecified: Secondary | ICD-10-CM | POA: Diagnosis not present

## 2016-11-08 DIAGNOSIS — N2581 Secondary hyperparathyroidism of renal origin: Secondary | ICD-10-CM | POA: Diagnosis not present

## 2016-11-08 DIAGNOSIS — D631 Anemia in chronic kidney disease: Secondary | ICD-10-CM | POA: Diagnosis not present

## 2016-11-10 DIAGNOSIS — Z992 Dependence on renal dialysis: Secondary | ICD-10-CM | POA: Diagnosis not present

## 2016-11-10 DIAGNOSIS — D631 Anemia in chronic kidney disease: Secondary | ICD-10-CM | POA: Diagnosis not present

## 2016-11-10 DIAGNOSIS — N186 End stage renal disease: Secondary | ICD-10-CM | POA: Diagnosis not present

## 2016-11-10 DIAGNOSIS — D509 Iron deficiency anemia, unspecified: Secondary | ICD-10-CM | POA: Diagnosis not present

## 2016-11-10 DIAGNOSIS — N2581 Secondary hyperparathyroidism of renal origin: Secondary | ICD-10-CM | POA: Diagnosis not present

## 2016-11-13 DIAGNOSIS — N186 End stage renal disease: Secondary | ICD-10-CM | POA: Diagnosis not present

## 2016-11-13 DIAGNOSIS — D631 Anemia in chronic kidney disease: Secondary | ICD-10-CM | POA: Diagnosis not present

## 2016-11-13 DIAGNOSIS — D509 Iron deficiency anemia, unspecified: Secondary | ICD-10-CM | POA: Diagnosis not present

## 2016-11-13 DIAGNOSIS — Z992 Dependence on renal dialysis: Secondary | ICD-10-CM | POA: Diagnosis not present

## 2016-11-13 DIAGNOSIS — N2581 Secondary hyperparathyroidism of renal origin: Secondary | ICD-10-CM | POA: Diagnosis not present

## 2016-11-15 DIAGNOSIS — Z992 Dependence on renal dialysis: Secondary | ICD-10-CM | POA: Diagnosis not present

## 2016-11-15 DIAGNOSIS — D631 Anemia in chronic kidney disease: Secondary | ICD-10-CM | POA: Diagnosis not present

## 2016-11-15 DIAGNOSIS — D509 Iron deficiency anemia, unspecified: Secondary | ICD-10-CM | POA: Diagnosis not present

## 2016-11-15 DIAGNOSIS — N2581 Secondary hyperparathyroidism of renal origin: Secondary | ICD-10-CM | POA: Diagnosis not present

## 2016-11-15 DIAGNOSIS — N186 End stage renal disease: Secondary | ICD-10-CM | POA: Diagnosis not present

## 2016-11-17 DIAGNOSIS — Z992 Dependence on renal dialysis: Secondary | ICD-10-CM | POA: Diagnosis not present

## 2016-11-17 DIAGNOSIS — N186 End stage renal disease: Secondary | ICD-10-CM | POA: Diagnosis not present

## 2016-11-17 DIAGNOSIS — D509 Iron deficiency anemia, unspecified: Secondary | ICD-10-CM | POA: Diagnosis not present

## 2016-11-17 DIAGNOSIS — D631 Anemia in chronic kidney disease: Secondary | ICD-10-CM | POA: Diagnosis not present

## 2016-11-17 DIAGNOSIS — N2581 Secondary hyperparathyroidism of renal origin: Secondary | ICD-10-CM | POA: Diagnosis not present

## 2016-11-20 DIAGNOSIS — N2581 Secondary hyperparathyroidism of renal origin: Secondary | ICD-10-CM | POA: Diagnosis not present

## 2016-11-20 DIAGNOSIS — D509 Iron deficiency anemia, unspecified: Secondary | ICD-10-CM | POA: Diagnosis not present

## 2016-11-20 DIAGNOSIS — D631 Anemia in chronic kidney disease: Secondary | ICD-10-CM | POA: Diagnosis not present

## 2016-11-20 DIAGNOSIS — N186 End stage renal disease: Secondary | ICD-10-CM | POA: Diagnosis not present

## 2016-11-20 DIAGNOSIS — Z992 Dependence on renal dialysis: Secondary | ICD-10-CM | POA: Diagnosis not present

## 2016-11-22 DIAGNOSIS — D631 Anemia in chronic kidney disease: Secondary | ICD-10-CM | POA: Diagnosis not present

## 2016-11-22 DIAGNOSIS — N2581 Secondary hyperparathyroidism of renal origin: Secondary | ICD-10-CM | POA: Diagnosis not present

## 2016-11-22 DIAGNOSIS — D509 Iron deficiency anemia, unspecified: Secondary | ICD-10-CM | POA: Diagnosis not present

## 2016-11-22 DIAGNOSIS — N186 End stage renal disease: Secondary | ICD-10-CM | POA: Diagnosis not present

## 2016-11-22 DIAGNOSIS — Z992 Dependence on renal dialysis: Secondary | ICD-10-CM | POA: Diagnosis not present

## 2016-11-24 DIAGNOSIS — D509 Iron deficiency anemia, unspecified: Secondary | ICD-10-CM | POA: Diagnosis not present

## 2016-11-24 DIAGNOSIS — D631 Anemia in chronic kidney disease: Secondary | ICD-10-CM | POA: Diagnosis not present

## 2016-11-24 DIAGNOSIS — N186 End stage renal disease: Secondary | ICD-10-CM | POA: Diagnosis not present

## 2016-11-24 DIAGNOSIS — N2581 Secondary hyperparathyroidism of renal origin: Secondary | ICD-10-CM | POA: Diagnosis not present

## 2016-11-24 DIAGNOSIS — Z992 Dependence on renal dialysis: Secondary | ICD-10-CM | POA: Diagnosis not present

## 2016-11-27 DIAGNOSIS — D631 Anemia in chronic kidney disease: Secondary | ICD-10-CM | POA: Diagnosis not present

## 2016-11-27 DIAGNOSIS — D509 Iron deficiency anemia, unspecified: Secondary | ICD-10-CM | POA: Diagnosis not present

## 2016-11-27 DIAGNOSIS — N186 End stage renal disease: Secondary | ICD-10-CM | POA: Diagnosis not present

## 2016-11-27 DIAGNOSIS — N2581 Secondary hyperparathyroidism of renal origin: Secondary | ICD-10-CM | POA: Diagnosis not present

## 2016-11-27 DIAGNOSIS — Z992 Dependence on renal dialysis: Secondary | ICD-10-CM | POA: Diagnosis not present

## 2016-11-29 DIAGNOSIS — D509 Iron deficiency anemia, unspecified: Secondary | ICD-10-CM | POA: Diagnosis not present

## 2016-11-29 DIAGNOSIS — Z992 Dependence on renal dialysis: Secondary | ICD-10-CM | POA: Diagnosis not present

## 2016-11-29 DIAGNOSIS — N2581 Secondary hyperparathyroidism of renal origin: Secondary | ICD-10-CM | POA: Diagnosis not present

## 2016-11-29 DIAGNOSIS — N186 End stage renal disease: Secondary | ICD-10-CM | POA: Diagnosis not present

## 2016-11-29 DIAGNOSIS — D631 Anemia in chronic kidney disease: Secondary | ICD-10-CM | POA: Diagnosis not present

## 2016-12-01 DIAGNOSIS — D509 Iron deficiency anemia, unspecified: Secondary | ICD-10-CM | POA: Diagnosis not present

## 2016-12-01 DIAGNOSIS — N2581 Secondary hyperparathyroidism of renal origin: Secondary | ICD-10-CM | POA: Diagnosis not present

## 2016-12-01 DIAGNOSIS — N186 End stage renal disease: Secondary | ICD-10-CM | POA: Diagnosis not present

## 2016-12-01 DIAGNOSIS — D631 Anemia in chronic kidney disease: Secondary | ICD-10-CM | POA: Diagnosis not present

## 2016-12-01 DIAGNOSIS — Z992 Dependence on renal dialysis: Secondary | ICD-10-CM | POA: Diagnosis not present

## 2016-12-04 DIAGNOSIS — Z992 Dependence on renal dialysis: Secondary | ICD-10-CM | POA: Diagnosis not present

## 2016-12-04 DIAGNOSIS — N186 End stage renal disease: Secondary | ICD-10-CM | POA: Diagnosis not present

## 2016-12-04 DIAGNOSIS — D631 Anemia in chronic kidney disease: Secondary | ICD-10-CM | POA: Diagnosis not present

## 2016-12-04 DIAGNOSIS — N2581 Secondary hyperparathyroidism of renal origin: Secondary | ICD-10-CM | POA: Diagnosis not present

## 2016-12-04 DIAGNOSIS — D509 Iron deficiency anemia, unspecified: Secondary | ICD-10-CM | POA: Diagnosis not present

## 2016-12-06 DIAGNOSIS — N2581 Secondary hyperparathyroidism of renal origin: Secondary | ICD-10-CM | POA: Diagnosis not present

## 2016-12-06 DIAGNOSIS — N186 End stage renal disease: Secondary | ICD-10-CM | POA: Diagnosis not present

## 2016-12-06 DIAGNOSIS — D631 Anemia in chronic kidney disease: Secondary | ICD-10-CM | POA: Diagnosis not present

## 2016-12-06 DIAGNOSIS — Z992 Dependence on renal dialysis: Secondary | ICD-10-CM | POA: Diagnosis not present

## 2016-12-06 DIAGNOSIS — D509 Iron deficiency anemia, unspecified: Secondary | ICD-10-CM | POA: Diagnosis not present

## 2016-12-07 DIAGNOSIS — N186 End stage renal disease: Secondary | ICD-10-CM | POA: Diagnosis not present

## 2016-12-07 DIAGNOSIS — Z992 Dependence on renal dialysis: Secondary | ICD-10-CM | POA: Diagnosis not present

## 2016-12-08 DIAGNOSIS — N2581 Secondary hyperparathyroidism of renal origin: Secondary | ICD-10-CM | POA: Diagnosis not present

## 2016-12-08 DIAGNOSIS — Z992 Dependence on renal dialysis: Secondary | ICD-10-CM | POA: Diagnosis not present

## 2016-12-08 DIAGNOSIS — D509 Iron deficiency anemia, unspecified: Secondary | ICD-10-CM | POA: Diagnosis not present

## 2016-12-08 DIAGNOSIS — N186 End stage renal disease: Secondary | ICD-10-CM | POA: Diagnosis not present

## 2016-12-08 DIAGNOSIS — D631 Anemia in chronic kidney disease: Secondary | ICD-10-CM | POA: Diagnosis not present

## 2016-12-11 DIAGNOSIS — Z992 Dependence on renal dialysis: Secondary | ICD-10-CM | POA: Diagnosis not present

## 2016-12-11 DIAGNOSIS — D509 Iron deficiency anemia, unspecified: Secondary | ICD-10-CM | POA: Diagnosis not present

## 2016-12-11 DIAGNOSIS — N186 End stage renal disease: Secondary | ICD-10-CM | POA: Diagnosis not present

## 2016-12-11 DIAGNOSIS — N2581 Secondary hyperparathyroidism of renal origin: Secondary | ICD-10-CM | POA: Diagnosis not present

## 2016-12-11 DIAGNOSIS — D631 Anemia in chronic kidney disease: Secondary | ICD-10-CM | POA: Diagnosis not present

## 2016-12-13 DIAGNOSIS — N186 End stage renal disease: Secondary | ICD-10-CM | POA: Diagnosis not present

## 2016-12-13 DIAGNOSIS — Z992 Dependence on renal dialysis: Secondary | ICD-10-CM | POA: Diagnosis not present

## 2016-12-13 DIAGNOSIS — D509 Iron deficiency anemia, unspecified: Secondary | ICD-10-CM | POA: Diagnosis not present

## 2016-12-13 DIAGNOSIS — N2581 Secondary hyperparathyroidism of renal origin: Secondary | ICD-10-CM | POA: Diagnosis not present

## 2016-12-13 DIAGNOSIS — D631 Anemia in chronic kidney disease: Secondary | ICD-10-CM | POA: Diagnosis not present

## 2016-12-15 DIAGNOSIS — N2581 Secondary hyperparathyroidism of renal origin: Secondary | ICD-10-CM | POA: Diagnosis not present

## 2016-12-15 DIAGNOSIS — N186 End stage renal disease: Secondary | ICD-10-CM | POA: Diagnosis not present

## 2016-12-15 DIAGNOSIS — D631 Anemia in chronic kidney disease: Secondary | ICD-10-CM | POA: Diagnosis not present

## 2016-12-15 DIAGNOSIS — Z992 Dependence on renal dialysis: Secondary | ICD-10-CM | POA: Diagnosis not present

## 2016-12-15 DIAGNOSIS — D509 Iron deficiency anemia, unspecified: Secondary | ICD-10-CM | POA: Diagnosis not present

## 2016-12-18 DIAGNOSIS — Z992 Dependence on renal dialysis: Secondary | ICD-10-CM | POA: Diagnosis not present

## 2016-12-18 DIAGNOSIS — N2581 Secondary hyperparathyroidism of renal origin: Secondary | ICD-10-CM | POA: Diagnosis not present

## 2016-12-18 DIAGNOSIS — D631 Anemia in chronic kidney disease: Secondary | ICD-10-CM | POA: Diagnosis not present

## 2016-12-18 DIAGNOSIS — D509 Iron deficiency anemia, unspecified: Secondary | ICD-10-CM | POA: Diagnosis not present

## 2016-12-18 DIAGNOSIS — N186 End stage renal disease: Secondary | ICD-10-CM | POA: Diagnosis not present

## 2016-12-19 DIAGNOSIS — I739 Peripheral vascular disease, unspecified: Secondary | ICD-10-CM | POA: Diagnosis not present

## 2016-12-19 DIAGNOSIS — I10 Essential (primary) hypertension: Secondary | ICD-10-CM | POA: Diagnosis not present

## 2016-12-19 DIAGNOSIS — F17200 Nicotine dependence, unspecified, uncomplicated: Secondary | ICD-10-CM | POA: Diagnosis not present

## 2016-12-19 DIAGNOSIS — N186 End stage renal disease: Secondary | ICD-10-CM | POA: Diagnosis not present

## 2016-12-20 DIAGNOSIS — N186 End stage renal disease: Secondary | ICD-10-CM | POA: Diagnosis not present

## 2016-12-20 DIAGNOSIS — D509 Iron deficiency anemia, unspecified: Secondary | ICD-10-CM | POA: Diagnosis not present

## 2016-12-20 DIAGNOSIS — N2581 Secondary hyperparathyroidism of renal origin: Secondary | ICD-10-CM | POA: Diagnosis not present

## 2016-12-20 DIAGNOSIS — Z992 Dependence on renal dialysis: Secondary | ICD-10-CM | POA: Diagnosis not present

## 2016-12-20 DIAGNOSIS — D631 Anemia in chronic kidney disease: Secondary | ICD-10-CM | POA: Diagnosis not present

## 2016-12-22 DIAGNOSIS — N2581 Secondary hyperparathyroidism of renal origin: Secondary | ICD-10-CM | POA: Diagnosis not present

## 2016-12-22 DIAGNOSIS — N186 End stage renal disease: Secondary | ICD-10-CM | POA: Diagnosis not present

## 2016-12-22 DIAGNOSIS — D631 Anemia in chronic kidney disease: Secondary | ICD-10-CM | POA: Diagnosis not present

## 2016-12-22 DIAGNOSIS — Z992 Dependence on renal dialysis: Secondary | ICD-10-CM | POA: Diagnosis not present

## 2016-12-22 DIAGNOSIS — D509 Iron deficiency anemia, unspecified: Secondary | ICD-10-CM | POA: Diagnosis not present

## 2016-12-25 DIAGNOSIS — D509 Iron deficiency anemia, unspecified: Secondary | ICD-10-CM | POA: Diagnosis not present

## 2016-12-25 DIAGNOSIS — N2581 Secondary hyperparathyroidism of renal origin: Secondary | ICD-10-CM | POA: Diagnosis not present

## 2016-12-25 DIAGNOSIS — Z992 Dependence on renal dialysis: Secondary | ICD-10-CM | POA: Diagnosis not present

## 2016-12-25 DIAGNOSIS — D631 Anemia in chronic kidney disease: Secondary | ICD-10-CM | POA: Diagnosis not present

## 2016-12-25 DIAGNOSIS — N186 End stage renal disease: Secondary | ICD-10-CM | POA: Diagnosis not present

## 2016-12-27 DIAGNOSIS — D509 Iron deficiency anemia, unspecified: Secondary | ICD-10-CM | POA: Diagnosis not present

## 2016-12-27 DIAGNOSIS — N186 End stage renal disease: Secondary | ICD-10-CM | POA: Diagnosis not present

## 2016-12-27 DIAGNOSIS — D631 Anemia in chronic kidney disease: Secondary | ICD-10-CM | POA: Diagnosis not present

## 2016-12-27 DIAGNOSIS — N2581 Secondary hyperparathyroidism of renal origin: Secondary | ICD-10-CM | POA: Diagnosis not present

## 2016-12-27 DIAGNOSIS — Z992 Dependence on renal dialysis: Secondary | ICD-10-CM | POA: Diagnosis not present

## 2016-12-29 DIAGNOSIS — D631 Anemia in chronic kidney disease: Secondary | ICD-10-CM | POA: Diagnosis not present

## 2016-12-29 DIAGNOSIS — N2581 Secondary hyperparathyroidism of renal origin: Secondary | ICD-10-CM | POA: Diagnosis not present

## 2016-12-29 DIAGNOSIS — Z992 Dependence on renal dialysis: Secondary | ICD-10-CM | POA: Diagnosis not present

## 2016-12-29 DIAGNOSIS — N186 End stage renal disease: Secondary | ICD-10-CM | POA: Diagnosis not present

## 2016-12-29 DIAGNOSIS — D509 Iron deficiency anemia, unspecified: Secondary | ICD-10-CM | POA: Diagnosis not present

## 2017-01-01 DIAGNOSIS — N2581 Secondary hyperparathyroidism of renal origin: Secondary | ICD-10-CM | POA: Diagnosis not present

## 2017-01-01 DIAGNOSIS — N186 End stage renal disease: Secondary | ICD-10-CM | POA: Diagnosis not present

## 2017-01-01 DIAGNOSIS — D509 Iron deficiency anemia, unspecified: Secondary | ICD-10-CM | POA: Diagnosis not present

## 2017-01-01 DIAGNOSIS — Z992 Dependence on renal dialysis: Secondary | ICD-10-CM | POA: Diagnosis not present

## 2017-01-01 DIAGNOSIS — D631 Anemia in chronic kidney disease: Secondary | ICD-10-CM | POA: Diagnosis not present

## 2017-01-03 DIAGNOSIS — D631 Anemia in chronic kidney disease: Secondary | ICD-10-CM | POA: Diagnosis not present

## 2017-01-03 DIAGNOSIS — D509 Iron deficiency anemia, unspecified: Secondary | ICD-10-CM | POA: Diagnosis not present

## 2017-01-03 DIAGNOSIS — N2581 Secondary hyperparathyroidism of renal origin: Secondary | ICD-10-CM | POA: Diagnosis not present

## 2017-01-03 DIAGNOSIS — N186 End stage renal disease: Secondary | ICD-10-CM | POA: Diagnosis not present

## 2017-01-03 DIAGNOSIS — Z992 Dependence on renal dialysis: Secondary | ICD-10-CM | POA: Diagnosis not present

## 2017-01-05 DIAGNOSIS — N186 End stage renal disease: Secondary | ICD-10-CM | POA: Diagnosis not present

## 2017-01-05 DIAGNOSIS — N2581 Secondary hyperparathyroidism of renal origin: Secondary | ICD-10-CM | POA: Diagnosis not present

## 2017-01-05 DIAGNOSIS — Z992 Dependence on renal dialysis: Secondary | ICD-10-CM | POA: Diagnosis not present

## 2017-01-05 DIAGNOSIS — D631 Anemia in chronic kidney disease: Secondary | ICD-10-CM | POA: Diagnosis not present

## 2017-01-05 DIAGNOSIS — D509 Iron deficiency anemia, unspecified: Secondary | ICD-10-CM | POA: Diagnosis not present

## 2017-01-06 DIAGNOSIS — N186 End stage renal disease: Secondary | ICD-10-CM | POA: Diagnosis not present

## 2017-01-06 DIAGNOSIS — Z992 Dependence on renal dialysis: Secondary | ICD-10-CM | POA: Diagnosis not present

## 2017-01-08 DIAGNOSIS — N186 End stage renal disease: Secondary | ICD-10-CM | POA: Diagnosis not present

## 2017-01-08 DIAGNOSIS — N2581 Secondary hyperparathyroidism of renal origin: Secondary | ICD-10-CM | POA: Diagnosis not present

## 2017-01-08 DIAGNOSIS — D631 Anemia in chronic kidney disease: Secondary | ICD-10-CM | POA: Diagnosis not present

## 2017-01-08 DIAGNOSIS — Z992 Dependence on renal dialysis: Secondary | ICD-10-CM | POA: Diagnosis not present

## 2017-01-08 DIAGNOSIS — D509 Iron deficiency anemia, unspecified: Secondary | ICD-10-CM | POA: Diagnosis not present

## 2017-01-10 DIAGNOSIS — N2581 Secondary hyperparathyroidism of renal origin: Secondary | ICD-10-CM | POA: Diagnosis not present

## 2017-01-10 DIAGNOSIS — N186 End stage renal disease: Secondary | ICD-10-CM | POA: Diagnosis not present

## 2017-01-10 DIAGNOSIS — Z992 Dependence on renal dialysis: Secondary | ICD-10-CM | POA: Diagnosis not present

## 2017-01-10 DIAGNOSIS — D631 Anemia in chronic kidney disease: Secondary | ICD-10-CM | POA: Diagnosis not present

## 2017-01-10 DIAGNOSIS — D509 Iron deficiency anemia, unspecified: Secondary | ICD-10-CM | POA: Diagnosis not present

## 2017-01-12 DIAGNOSIS — N186 End stage renal disease: Secondary | ICD-10-CM | POA: Diagnosis not present

## 2017-01-12 DIAGNOSIS — D631 Anemia in chronic kidney disease: Secondary | ICD-10-CM | POA: Diagnosis not present

## 2017-01-12 DIAGNOSIS — Z992 Dependence on renal dialysis: Secondary | ICD-10-CM | POA: Diagnosis not present

## 2017-01-12 DIAGNOSIS — D509 Iron deficiency anemia, unspecified: Secondary | ICD-10-CM | POA: Diagnosis not present

## 2017-01-12 DIAGNOSIS — N2581 Secondary hyperparathyroidism of renal origin: Secondary | ICD-10-CM | POA: Diagnosis not present

## 2017-01-15 DIAGNOSIS — D631 Anemia in chronic kidney disease: Secondary | ICD-10-CM | POA: Diagnosis not present

## 2017-01-15 DIAGNOSIS — N2581 Secondary hyperparathyroidism of renal origin: Secondary | ICD-10-CM | POA: Diagnosis not present

## 2017-01-15 DIAGNOSIS — N186 End stage renal disease: Secondary | ICD-10-CM | POA: Diagnosis not present

## 2017-01-15 DIAGNOSIS — Z992 Dependence on renal dialysis: Secondary | ICD-10-CM | POA: Diagnosis not present

## 2017-01-15 DIAGNOSIS — D509 Iron deficiency anemia, unspecified: Secondary | ICD-10-CM | POA: Diagnosis not present

## 2017-01-17 DIAGNOSIS — N2581 Secondary hyperparathyroidism of renal origin: Secondary | ICD-10-CM | POA: Diagnosis not present

## 2017-01-17 DIAGNOSIS — D509 Iron deficiency anemia, unspecified: Secondary | ICD-10-CM | POA: Diagnosis not present

## 2017-01-17 DIAGNOSIS — N186 End stage renal disease: Secondary | ICD-10-CM | POA: Diagnosis not present

## 2017-01-17 DIAGNOSIS — D631 Anemia in chronic kidney disease: Secondary | ICD-10-CM | POA: Diagnosis not present

## 2017-01-17 DIAGNOSIS — Z992 Dependence on renal dialysis: Secondary | ICD-10-CM | POA: Diagnosis not present

## 2017-01-19 DIAGNOSIS — N2581 Secondary hyperparathyroidism of renal origin: Secondary | ICD-10-CM | POA: Diagnosis not present

## 2017-01-19 DIAGNOSIS — Z992 Dependence on renal dialysis: Secondary | ICD-10-CM | POA: Diagnosis not present

## 2017-01-19 DIAGNOSIS — D631 Anemia in chronic kidney disease: Secondary | ICD-10-CM | POA: Diagnosis not present

## 2017-01-19 DIAGNOSIS — N186 End stage renal disease: Secondary | ICD-10-CM | POA: Diagnosis not present

## 2017-01-19 DIAGNOSIS — D509 Iron deficiency anemia, unspecified: Secondary | ICD-10-CM | POA: Diagnosis not present

## 2017-01-22 DIAGNOSIS — N186 End stage renal disease: Secondary | ICD-10-CM | POA: Diagnosis not present

## 2017-01-22 DIAGNOSIS — D509 Iron deficiency anemia, unspecified: Secondary | ICD-10-CM | POA: Diagnosis not present

## 2017-01-22 DIAGNOSIS — Z992 Dependence on renal dialysis: Secondary | ICD-10-CM | POA: Diagnosis not present

## 2017-01-22 DIAGNOSIS — D631 Anemia in chronic kidney disease: Secondary | ICD-10-CM | POA: Diagnosis not present

## 2017-01-22 DIAGNOSIS — N2581 Secondary hyperparathyroidism of renal origin: Secondary | ICD-10-CM | POA: Diagnosis not present

## 2017-01-24 DIAGNOSIS — D509 Iron deficiency anemia, unspecified: Secondary | ICD-10-CM | POA: Diagnosis not present

## 2017-01-24 DIAGNOSIS — Z992 Dependence on renal dialysis: Secondary | ICD-10-CM | POA: Diagnosis not present

## 2017-01-24 DIAGNOSIS — N186 End stage renal disease: Secondary | ICD-10-CM | POA: Diagnosis not present

## 2017-01-24 DIAGNOSIS — D631 Anemia in chronic kidney disease: Secondary | ICD-10-CM | POA: Diagnosis not present

## 2017-01-24 DIAGNOSIS — N2581 Secondary hyperparathyroidism of renal origin: Secondary | ICD-10-CM | POA: Diagnosis not present

## 2017-01-26 DIAGNOSIS — D631 Anemia in chronic kidney disease: Secondary | ICD-10-CM | POA: Diagnosis not present

## 2017-01-26 DIAGNOSIS — D509 Iron deficiency anemia, unspecified: Secondary | ICD-10-CM | POA: Diagnosis not present

## 2017-01-26 DIAGNOSIS — N2581 Secondary hyperparathyroidism of renal origin: Secondary | ICD-10-CM | POA: Diagnosis not present

## 2017-01-26 DIAGNOSIS — Z992 Dependence on renal dialysis: Secondary | ICD-10-CM | POA: Diagnosis not present

## 2017-01-26 DIAGNOSIS — N186 End stage renal disease: Secondary | ICD-10-CM | POA: Diagnosis not present

## 2017-01-29 DIAGNOSIS — D631 Anemia in chronic kidney disease: Secondary | ICD-10-CM | POA: Diagnosis not present

## 2017-01-29 DIAGNOSIS — N186 End stage renal disease: Secondary | ICD-10-CM | POA: Diagnosis not present

## 2017-01-29 DIAGNOSIS — D509 Iron deficiency anemia, unspecified: Secondary | ICD-10-CM | POA: Diagnosis not present

## 2017-01-29 DIAGNOSIS — N2581 Secondary hyperparathyroidism of renal origin: Secondary | ICD-10-CM | POA: Diagnosis not present

## 2017-01-29 DIAGNOSIS — Z992 Dependence on renal dialysis: Secondary | ICD-10-CM | POA: Diagnosis not present

## 2017-01-31 DIAGNOSIS — D509 Iron deficiency anemia, unspecified: Secondary | ICD-10-CM | POA: Diagnosis not present

## 2017-01-31 DIAGNOSIS — N2581 Secondary hyperparathyroidism of renal origin: Secondary | ICD-10-CM | POA: Diagnosis not present

## 2017-01-31 DIAGNOSIS — Z992 Dependence on renal dialysis: Secondary | ICD-10-CM | POA: Diagnosis not present

## 2017-01-31 DIAGNOSIS — D631 Anemia in chronic kidney disease: Secondary | ICD-10-CM | POA: Diagnosis not present

## 2017-01-31 DIAGNOSIS — N186 End stage renal disease: Secondary | ICD-10-CM | POA: Diagnosis not present

## 2017-02-02 DIAGNOSIS — D509 Iron deficiency anemia, unspecified: Secondary | ICD-10-CM | POA: Diagnosis not present

## 2017-02-02 DIAGNOSIS — D631 Anemia in chronic kidney disease: Secondary | ICD-10-CM | POA: Diagnosis not present

## 2017-02-02 DIAGNOSIS — N186 End stage renal disease: Secondary | ICD-10-CM | POA: Diagnosis not present

## 2017-02-02 DIAGNOSIS — Z992 Dependence on renal dialysis: Secondary | ICD-10-CM | POA: Diagnosis not present

## 2017-02-02 DIAGNOSIS — N2581 Secondary hyperparathyroidism of renal origin: Secondary | ICD-10-CM | POA: Diagnosis not present

## 2017-02-05 DIAGNOSIS — N2581 Secondary hyperparathyroidism of renal origin: Secondary | ICD-10-CM | POA: Diagnosis not present

## 2017-02-05 DIAGNOSIS — D631 Anemia in chronic kidney disease: Secondary | ICD-10-CM | POA: Diagnosis not present

## 2017-02-05 DIAGNOSIS — Z992 Dependence on renal dialysis: Secondary | ICD-10-CM | POA: Diagnosis not present

## 2017-02-05 DIAGNOSIS — D509 Iron deficiency anemia, unspecified: Secondary | ICD-10-CM | POA: Diagnosis not present

## 2017-02-05 DIAGNOSIS — N186 End stage renal disease: Secondary | ICD-10-CM | POA: Diagnosis not present

## 2017-02-06 DIAGNOSIS — Z992 Dependence on renal dialysis: Secondary | ICD-10-CM | POA: Diagnosis not present

## 2017-02-06 DIAGNOSIS — N186 End stage renal disease: Secondary | ICD-10-CM | POA: Diagnosis not present

## 2017-02-07 DIAGNOSIS — D631 Anemia in chronic kidney disease: Secondary | ICD-10-CM | POA: Diagnosis not present

## 2017-02-07 DIAGNOSIS — D509 Iron deficiency anemia, unspecified: Secondary | ICD-10-CM | POA: Diagnosis not present

## 2017-02-07 DIAGNOSIS — Z992 Dependence on renal dialysis: Secondary | ICD-10-CM | POA: Diagnosis not present

## 2017-02-07 DIAGNOSIS — N2581 Secondary hyperparathyroidism of renal origin: Secondary | ICD-10-CM | POA: Diagnosis not present

## 2017-02-07 DIAGNOSIS — N186 End stage renal disease: Secondary | ICD-10-CM | POA: Diagnosis not present

## 2017-02-09 DIAGNOSIS — N2581 Secondary hyperparathyroidism of renal origin: Secondary | ICD-10-CM | POA: Diagnosis not present

## 2017-02-09 DIAGNOSIS — D631 Anemia in chronic kidney disease: Secondary | ICD-10-CM | POA: Diagnosis not present

## 2017-02-09 DIAGNOSIS — Z992 Dependence on renal dialysis: Secondary | ICD-10-CM | POA: Diagnosis not present

## 2017-02-09 DIAGNOSIS — N186 End stage renal disease: Secondary | ICD-10-CM | POA: Diagnosis not present

## 2017-02-09 DIAGNOSIS — D509 Iron deficiency anemia, unspecified: Secondary | ICD-10-CM | POA: Diagnosis not present

## 2017-02-12 DIAGNOSIS — N186 End stage renal disease: Secondary | ICD-10-CM | POA: Diagnosis not present

## 2017-02-12 DIAGNOSIS — D631 Anemia in chronic kidney disease: Secondary | ICD-10-CM | POA: Diagnosis not present

## 2017-02-12 DIAGNOSIS — D509 Iron deficiency anemia, unspecified: Secondary | ICD-10-CM | POA: Diagnosis not present

## 2017-02-12 DIAGNOSIS — N2581 Secondary hyperparathyroidism of renal origin: Secondary | ICD-10-CM | POA: Diagnosis not present

## 2017-02-12 DIAGNOSIS — Z992 Dependence on renal dialysis: Secondary | ICD-10-CM | POA: Diagnosis not present

## 2017-02-14 DIAGNOSIS — Z992 Dependence on renal dialysis: Secondary | ICD-10-CM | POA: Diagnosis not present

## 2017-02-14 DIAGNOSIS — D631 Anemia in chronic kidney disease: Secondary | ICD-10-CM | POA: Diagnosis not present

## 2017-02-14 DIAGNOSIS — D509 Iron deficiency anemia, unspecified: Secondary | ICD-10-CM | POA: Diagnosis not present

## 2017-02-14 DIAGNOSIS — N186 End stage renal disease: Secondary | ICD-10-CM | POA: Diagnosis not present

## 2017-02-14 DIAGNOSIS — N2581 Secondary hyperparathyroidism of renal origin: Secondary | ICD-10-CM | POA: Diagnosis not present

## 2017-02-16 DIAGNOSIS — N2581 Secondary hyperparathyroidism of renal origin: Secondary | ICD-10-CM | POA: Diagnosis not present

## 2017-02-16 DIAGNOSIS — Z992 Dependence on renal dialysis: Secondary | ICD-10-CM | POA: Diagnosis not present

## 2017-02-16 DIAGNOSIS — D631 Anemia in chronic kidney disease: Secondary | ICD-10-CM | POA: Diagnosis not present

## 2017-02-16 DIAGNOSIS — D509 Iron deficiency anemia, unspecified: Secondary | ICD-10-CM | POA: Diagnosis not present

## 2017-02-16 DIAGNOSIS — N186 End stage renal disease: Secondary | ICD-10-CM | POA: Diagnosis not present

## 2017-02-19 DIAGNOSIS — N2581 Secondary hyperparathyroidism of renal origin: Secondary | ICD-10-CM | POA: Diagnosis not present

## 2017-02-19 DIAGNOSIS — Z992 Dependence on renal dialysis: Secondary | ICD-10-CM | POA: Diagnosis not present

## 2017-02-19 DIAGNOSIS — D509 Iron deficiency anemia, unspecified: Secondary | ICD-10-CM | POA: Diagnosis not present

## 2017-02-19 DIAGNOSIS — D631 Anemia in chronic kidney disease: Secondary | ICD-10-CM | POA: Diagnosis not present

## 2017-02-19 DIAGNOSIS — N186 End stage renal disease: Secondary | ICD-10-CM | POA: Diagnosis not present

## 2017-02-21 DIAGNOSIS — D631 Anemia in chronic kidney disease: Secondary | ICD-10-CM | POA: Diagnosis not present

## 2017-02-21 DIAGNOSIS — N186 End stage renal disease: Secondary | ICD-10-CM | POA: Diagnosis not present

## 2017-02-21 DIAGNOSIS — N2581 Secondary hyperparathyroidism of renal origin: Secondary | ICD-10-CM | POA: Diagnosis not present

## 2017-02-21 DIAGNOSIS — D509 Iron deficiency anemia, unspecified: Secondary | ICD-10-CM | POA: Diagnosis not present

## 2017-02-21 DIAGNOSIS — Z992 Dependence on renal dialysis: Secondary | ICD-10-CM | POA: Diagnosis not present

## 2017-02-23 DIAGNOSIS — N2581 Secondary hyperparathyroidism of renal origin: Secondary | ICD-10-CM | POA: Diagnosis not present

## 2017-02-23 DIAGNOSIS — N186 End stage renal disease: Secondary | ICD-10-CM | POA: Diagnosis not present

## 2017-02-23 DIAGNOSIS — Z992 Dependence on renal dialysis: Secondary | ICD-10-CM | POA: Diagnosis not present

## 2017-02-23 DIAGNOSIS — D631 Anemia in chronic kidney disease: Secondary | ICD-10-CM | POA: Diagnosis not present

## 2017-02-23 DIAGNOSIS — D509 Iron deficiency anemia, unspecified: Secondary | ICD-10-CM | POA: Diagnosis not present

## 2017-02-26 DIAGNOSIS — Z992 Dependence on renal dialysis: Secondary | ICD-10-CM | POA: Diagnosis not present

## 2017-02-26 DIAGNOSIS — N2581 Secondary hyperparathyroidism of renal origin: Secondary | ICD-10-CM | POA: Diagnosis not present

## 2017-02-26 DIAGNOSIS — D631 Anemia in chronic kidney disease: Secondary | ICD-10-CM | POA: Diagnosis not present

## 2017-02-26 DIAGNOSIS — D509 Iron deficiency anemia, unspecified: Secondary | ICD-10-CM | POA: Diagnosis not present

## 2017-02-26 DIAGNOSIS — N186 End stage renal disease: Secondary | ICD-10-CM | POA: Diagnosis not present

## 2017-02-28 DIAGNOSIS — N186 End stage renal disease: Secondary | ICD-10-CM | POA: Diagnosis not present

## 2017-02-28 DIAGNOSIS — N2581 Secondary hyperparathyroidism of renal origin: Secondary | ICD-10-CM | POA: Diagnosis not present

## 2017-02-28 DIAGNOSIS — D509 Iron deficiency anemia, unspecified: Secondary | ICD-10-CM | POA: Diagnosis not present

## 2017-02-28 DIAGNOSIS — D631 Anemia in chronic kidney disease: Secondary | ICD-10-CM | POA: Diagnosis not present

## 2017-02-28 DIAGNOSIS — Z992 Dependence on renal dialysis: Secondary | ICD-10-CM | POA: Diagnosis not present

## 2017-03-02 DIAGNOSIS — D509 Iron deficiency anemia, unspecified: Secondary | ICD-10-CM | POA: Diagnosis not present

## 2017-03-02 DIAGNOSIS — N2581 Secondary hyperparathyroidism of renal origin: Secondary | ICD-10-CM | POA: Diagnosis not present

## 2017-03-02 DIAGNOSIS — N186 End stage renal disease: Secondary | ICD-10-CM | POA: Diagnosis not present

## 2017-03-02 DIAGNOSIS — Z992 Dependence on renal dialysis: Secondary | ICD-10-CM | POA: Diagnosis not present

## 2017-03-02 DIAGNOSIS — D631 Anemia in chronic kidney disease: Secondary | ICD-10-CM | POA: Diagnosis not present

## 2017-03-05 DIAGNOSIS — N2581 Secondary hyperparathyroidism of renal origin: Secondary | ICD-10-CM | POA: Diagnosis not present

## 2017-03-05 DIAGNOSIS — D509 Iron deficiency anemia, unspecified: Secondary | ICD-10-CM | POA: Diagnosis not present

## 2017-03-05 DIAGNOSIS — Z992 Dependence on renal dialysis: Secondary | ICD-10-CM | POA: Diagnosis not present

## 2017-03-05 DIAGNOSIS — N186 End stage renal disease: Secondary | ICD-10-CM | POA: Diagnosis not present

## 2017-03-05 DIAGNOSIS — D631 Anemia in chronic kidney disease: Secondary | ICD-10-CM | POA: Diagnosis not present

## 2017-03-07 DIAGNOSIS — N186 End stage renal disease: Secondary | ICD-10-CM | POA: Diagnosis not present

## 2017-03-07 DIAGNOSIS — D631 Anemia in chronic kidney disease: Secondary | ICD-10-CM | POA: Diagnosis not present

## 2017-03-07 DIAGNOSIS — D509 Iron deficiency anemia, unspecified: Secondary | ICD-10-CM | POA: Diagnosis not present

## 2017-03-07 DIAGNOSIS — Z992 Dependence on renal dialysis: Secondary | ICD-10-CM | POA: Diagnosis not present

## 2017-03-07 DIAGNOSIS — N2581 Secondary hyperparathyroidism of renal origin: Secondary | ICD-10-CM | POA: Diagnosis not present

## 2017-03-09 DIAGNOSIS — Z992 Dependence on renal dialysis: Secondary | ICD-10-CM | POA: Diagnosis not present

## 2017-03-09 DIAGNOSIS — D631 Anemia in chronic kidney disease: Secondary | ICD-10-CM | POA: Diagnosis not present

## 2017-03-09 DIAGNOSIS — N2581 Secondary hyperparathyroidism of renal origin: Secondary | ICD-10-CM | POA: Diagnosis not present

## 2017-03-09 DIAGNOSIS — N186 End stage renal disease: Secondary | ICD-10-CM | POA: Diagnosis not present

## 2017-03-09 DIAGNOSIS — D509 Iron deficiency anemia, unspecified: Secondary | ICD-10-CM | POA: Diagnosis not present

## 2017-03-12 DIAGNOSIS — D509 Iron deficiency anemia, unspecified: Secondary | ICD-10-CM | POA: Diagnosis not present

## 2017-03-12 DIAGNOSIS — Z992 Dependence on renal dialysis: Secondary | ICD-10-CM | POA: Diagnosis not present

## 2017-03-12 DIAGNOSIS — N186 End stage renal disease: Secondary | ICD-10-CM | POA: Diagnosis not present

## 2017-03-12 DIAGNOSIS — D631 Anemia in chronic kidney disease: Secondary | ICD-10-CM | POA: Diagnosis not present

## 2017-03-12 DIAGNOSIS — N2581 Secondary hyperparathyroidism of renal origin: Secondary | ICD-10-CM | POA: Diagnosis not present

## 2017-03-14 DIAGNOSIS — D509 Iron deficiency anemia, unspecified: Secondary | ICD-10-CM | POA: Diagnosis not present

## 2017-03-14 DIAGNOSIS — Z992 Dependence on renal dialysis: Secondary | ICD-10-CM | POA: Diagnosis not present

## 2017-03-14 DIAGNOSIS — D631 Anemia in chronic kidney disease: Secondary | ICD-10-CM | POA: Diagnosis not present

## 2017-03-14 DIAGNOSIS — N186 End stage renal disease: Secondary | ICD-10-CM | POA: Diagnosis not present

## 2017-03-14 DIAGNOSIS — N2581 Secondary hyperparathyroidism of renal origin: Secondary | ICD-10-CM | POA: Diagnosis not present

## 2017-03-16 DIAGNOSIS — Z992 Dependence on renal dialysis: Secondary | ICD-10-CM | POA: Diagnosis not present

## 2017-03-16 DIAGNOSIS — N2581 Secondary hyperparathyroidism of renal origin: Secondary | ICD-10-CM | POA: Diagnosis not present

## 2017-03-16 DIAGNOSIS — D509 Iron deficiency anemia, unspecified: Secondary | ICD-10-CM | POA: Diagnosis not present

## 2017-03-16 DIAGNOSIS — N186 End stage renal disease: Secondary | ICD-10-CM | POA: Diagnosis not present

## 2017-03-16 DIAGNOSIS — D631 Anemia in chronic kidney disease: Secondary | ICD-10-CM | POA: Diagnosis not present

## 2017-03-19 DIAGNOSIS — N2581 Secondary hyperparathyroidism of renal origin: Secondary | ICD-10-CM | POA: Diagnosis not present

## 2017-03-19 DIAGNOSIS — D631 Anemia in chronic kidney disease: Secondary | ICD-10-CM | POA: Diagnosis not present

## 2017-03-19 DIAGNOSIS — N186 End stage renal disease: Secondary | ICD-10-CM | POA: Diagnosis not present

## 2017-03-19 DIAGNOSIS — Z992 Dependence on renal dialysis: Secondary | ICD-10-CM | POA: Diagnosis not present

## 2017-03-19 DIAGNOSIS — D509 Iron deficiency anemia, unspecified: Secondary | ICD-10-CM | POA: Diagnosis not present

## 2017-03-21 DIAGNOSIS — N2581 Secondary hyperparathyroidism of renal origin: Secondary | ICD-10-CM | POA: Diagnosis not present

## 2017-03-21 DIAGNOSIS — D631 Anemia in chronic kidney disease: Secondary | ICD-10-CM | POA: Diagnosis not present

## 2017-03-21 DIAGNOSIS — Z992 Dependence on renal dialysis: Secondary | ICD-10-CM | POA: Diagnosis not present

## 2017-03-21 DIAGNOSIS — D509 Iron deficiency anemia, unspecified: Secondary | ICD-10-CM | POA: Diagnosis not present

## 2017-03-21 DIAGNOSIS — N186 End stage renal disease: Secondary | ICD-10-CM | POA: Diagnosis not present

## 2017-03-22 DIAGNOSIS — N186 End stage renal disease: Secondary | ICD-10-CM | POA: Diagnosis not present

## 2017-03-22 DIAGNOSIS — E782 Mixed hyperlipidemia: Secondary | ICD-10-CM | POA: Diagnosis not present

## 2017-03-22 DIAGNOSIS — R627 Adult failure to thrive: Secondary | ICD-10-CM | POA: Diagnosis not present

## 2017-03-22 DIAGNOSIS — I10 Essential (primary) hypertension: Secondary | ICD-10-CM | POA: Diagnosis not present

## 2017-03-22 DIAGNOSIS — Z23 Encounter for immunization: Secondary | ICD-10-CM | POA: Diagnosis not present

## 2017-03-23 DIAGNOSIS — N2581 Secondary hyperparathyroidism of renal origin: Secondary | ICD-10-CM | POA: Diagnosis not present

## 2017-03-23 DIAGNOSIS — N186 End stage renal disease: Secondary | ICD-10-CM | POA: Diagnosis not present

## 2017-03-23 DIAGNOSIS — D509 Iron deficiency anemia, unspecified: Secondary | ICD-10-CM | POA: Diagnosis not present

## 2017-03-23 DIAGNOSIS — D631 Anemia in chronic kidney disease: Secondary | ICD-10-CM | POA: Diagnosis not present

## 2017-03-23 DIAGNOSIS — Z992 Dependence on renal dialysis: Secondary | ICD-10-CM | POA: Diagnosis not present

## 2017-03-26 DIAGNOSIS — D631 Anemia in chronic kidney disease: Secondary | ICD-10-CM | POA: Diagnosis not present

## 2017-03-26 DIAGNOSIS — N186 End stage renal disease: Secondary | ICD-10-CM | POA: Diagnosis not present

## 2017-03-26 DIAGNOSIS — N2581 Secondary hyperparathyroidism of renal origin: Secondary | ICD-10-CM | POA: Diagnosis not present

## 2017-03-26 DIAGNOSIS — Z992 Dependence on renal dialysis: Secondary | ICD-10-CM | POA: Diagnosis not present

## 2017-03-26 DIAGNOSIS — D509 Iron deficiency anemia, unspecified: Secondary | ICD-10-CM | POA: Diagnosis not present

## 2017-03-28 DIAGNOSIS — D631 Anemia in chronic kidney disease: Secondary | ICD-10-CM | POA: Diagnosis not present

## 2017-03-28 DIAGNOSIS — N186 End stage renal disease: Secondary | ICD-10-CM | POA: Diagnosis not present

## 2017-03-28 DIAGNOSIS — Z992 Dependence on renal dialysis: Secondary | ICD-10-CM | POA: Diagnosis not present

## 2017-03-28 DIAGNOSIS — D509 Iron deficiency anemia, unspecified: Secondary | ICD-10-CM | POA: Diagnosis not present

## 2017-03-28 DIAGNOSIS — N2581 Secondary hyperparathyroidism of renal origin: Secondary | ICD-10-CM | POA: Diagnosis not present

## 2017-03-30 DIAGNOSIS — D631 Anemia in chronic kidney disease: Secondary | ICD-10-CM | POA: Diagnosis not present

## 2017-03-30 DIAGNOSIS — N186 End stage renal disease: Secondary | ICD-10-CM | POA: Diagnosis not present

## 2017-03-30 DIAGNOSIS — N2581 Secondary hyperparathyroidism of renal origin: Secondary | ICD-10-CM | POA: Diagnosis not present

## 2017-03-30 DIAGNOSIS — D509 Iron deficiency anemia, unspecified: Secondary | ICD-10-CM | POA: Diagnosis not present

## 2017-03-30 DIAGNOSIS — Z992 Dependence on renal dialysis: Secondary | ICD-10-CM | POA: Diagnosis not present

## 2017-04-02 DIAGNOSIS — N2581 Secondary hyperparathyroidism of renal origin: Secondary | ICD-10-CM | POA: Diagnosis not present

## 2017-04-02 DIAGNOSIS — D631 Anemia in chronic kidney disease: Secondary | ICD-10-CM | POA: Diagnosis not present

## 2017-04-02 DIAGNOSIS — Z992 Dependence on renal dialysis: Secondary | ICD-10-CM | POA: Diagnosis not present

## 2017-04-02 DIAGNOSIS — N186 End stage renal disease: Secondary | ICD-10-CM | POA: Diagnosis not present

## 2017-04-02 DIAGNOSIS — D509 Iron deficiency anemia, unspecified: Secondary | ICD-10-CM | POA: Diagnosis not present

## 2017-04-04 DIAGNOSIS — D509 Iron deficiency anemia, unspecified: Secondary | ICD-10-CM | POA: Diagnosis not present

## 2017-04-04 DIAGNOSIS — D631 Anemia in chronic kidney disease: Secondary | ICD-10-CM | POA: Diagnosis not present

## 2017-04-04 DIAGNOSIS — Z992 Dependence on renal dialysis: Secondary | ICD-10-CM | POA: Diagnosis not present

## 2017-04-04 DIAGNOSIS — N186 End stage renal disease: Secondary | ICD-10-CM | POA: Diagnosis not present

## 2017-04-04 DIAGNOSIS — N2581 Secondary hyperparathyroidism of renal origin: Secondary | ICD-10-CM | POA: Diagnosis not present

## 2017-04-06 DIAGNOSIS — D509 Iron deficiency anemia, unspecified: Secondary | ICD-10-CM | POA: Diagnosis not present

## 2017-04-06 DIAGNOSIS — N186 End stage renal disease: Secondary | ICD-10-CM | POA: Diagnosis not present

## 2017-04-06 DIAGNOSIS — Z992 Dependence on renal dialysis: Secondary | ICD-10-CM | POA: Diagnosis not present

## 2017-04-06 DIAGNOSIS — N2581 Secondary hyperparathyroidism of renal origin: Secondary | ICD-10-CM | POA: Diagnosis not present

## 2017-04-06 DIAGNOSIS — D631 Anemia in chronic kidney disease: Secondary | ICD-10-CM | POA: Diagnosis not present

## 2017-04-08 DIAGNOSIS — Z992 Dependence on renal dialysis: Secondary | ICD-10-CM | POA: Diagnosis not present

## 2017-04-08 DIAGNOSIS — N186 End stage renal disease: Secondary | ICD-10-CM | POA: Diagnosis not present

## 2017-04-09 DIAGNOSIS — N2581 Secondary hyperparathyroidism of renal origin: Secondary | ICD-10-CM | POA: Diagnosis not present

## 2017-04-09 DIAGNOSIS — Z992 Dependence on renal dialysis: Secondary | ICD-10-CM | POA: Diagnosis not present

## 2017-04-09 DIAGNOSIS — D631 Anemia in chronic kidney disease: Secondary | ICD-10-CM | POA: Diagnosis not present

## 2017-04-09 DIAGNOSIS — N186 End stage renal disease: Secondary | ICD-10-CM | POA: Diagnosis not present

## 2017-04-09 DIAGNOSIS — D509 Iron deficiency anemia, unspecified: Secondary | ICD-10-CM | POA: Diagnosis not present

## 2017-04-11 DIAGNOSIS — D631 Anemia in chronic kidney disease: Secondary | ICD-10-CM | POA: Diagnosis not present

## 2017-04-11 DIAGNOSIS — Z992 Dependence on renal dialysis: Secondary | ICD-10-CM | POA: Diagnosis not present

## 2017-04-11 DIAGNOSIS — N2581 Secondary hyperparathyroidism of renal origin: Secondary | ICD-10-CM | POA: Diagnosis not present

## 2017-04-11 DIAGNOSIS — N186 End stage renal disease: Secondary | ICD-10-CM | POA: Diagnosis not present

## 2017-04-11 DIAGNOSIS — D509 Iron deficiency anemia, unspecified: Secondary | ICD-10-CM | POA: Diagnosis not present

## 2017-04-13 DIAGNOSIS — N186 End stage renal disease: Secondary | ICD-10-CM | POA: Diagnosis not present

## 2017-04-13 DIAGNOSIS — D631 Anemia in chronic kidney disease: Secondary | ICD-10-CM | POA: Diagnosis not present

## 2017-04-13 DIAGNOSIS — D509 Iron deficiency anemia, unspecified: Secondary | ICD-10-CM | POA: Diagnosis not present

## 2017-04-13 DIAGNOSIS — Z992 Dependence on renal dialysis: Secondary | ICD-10-CM | POA: Diagnosis not present

## 2017-04-13 DIAGNOSIS — N2581 Secondary hyperparathyroidism of renal origin: Secondary | ICD-10-CM | POA: Diagnosis not present

## 2017-04-16 DIAGNOSIS — N186 End stage renal disease: Secondary | ICD-10-CM | POA: Diagnosis not present

## 2017-04-16 DIAGNOSIS — D509 Iron deficiency anemia, unspecified: Secondary | ICD-10-CM | POA: Diagnosis not present

## 2017-04-16 DIAGNOSIS — D631 Anemia in chronic kidney disease: Secondary | ICD-10-CM | POA: Diagnosis not present

## 2017-04-16 DIAGNOSIS — Z992 Dependence on renal dialysis: Secondary | ICD-10-CM | POA: Diagnosis not present

## 2017-04-16 DIAGNOSIS — N2581 Secondary hyperparathyroidism of renal origin: Secondary | ICD-10-CM | POA: Diagnosis not present

## 2017-04-18 DIAGNOSIS — N186 End stage renal disease: Secondary | ICD-10-CM | POA: Diagnosis not present

## 2017-04-18 DIAGNOSIS — D509 Iron deficiency anemia, unspecified: Secondary | ICD-10-CM | POA: Diagnosis not present

## 2017-04-18 DIAGNOSIS — N2581 Secondary hyperparathyroidism of renal origin: Secondary | ICD-10-CM | POA: Diagnosis not present

## 2017-04-18 DIAGNOSIS — Z992 Dependence on renal dialysis: Secondary | ICD-10-CM | POA: Diagnosis not present

## 2017-04-18 DIAGNOSIS — D631 Anemia in chronic kidney disease: Secondary | ICD-10-CM | POA: Diagnosis not present

## 2017-04-21 DIAGNOSIS — D509 Iron deficiency anemia, unspecified: Secondary | ICD-10-CM | POA: Diagnosis not present

## 2017-04-21 DIAGNOSIS — Z992 Dependence on renal dialysis: Secondary | ICD-10-CM | POA: Diagnosis not present

## 2017-04-21 DIAGNOSIS — D631 Anemia in chronic kidney disease: Secondary | ICD-10-CM | POA: Diagnosis not present

## 2017-04-21 DIAGNOSIS — N186 End stage renal disease: Secondary | ICD-10-CM | POA: Diagnosis not present

## 2017-04-21 DIAGNOSIS — N2581 Secondary hyperparathyroidism of renal origin: Secondary | ICD-10-CM | POA: Diagnosis not present

## 2017-04-23 DIAGNOSIS — Z992 Dependence on renal dialysis: Secondary | ICD-10-CM | POA: Diagnosis not present

## 2017-04-23 DIAGNOSIS — D631 Anemia in chronic kidney disease: Secondary | ICD-10-CM | POA: Diagnosis not present

## 2017-04-23 DIAGNOSIS — N2581 Secondary hyperparathyroidism of renal origin: Secondary | ICD-10-CM | POA: Diagnosis not present

## 2017-04-23 DIAGNOSIS — N186 End stage renal disease: Secondary | ICD-10-CM | POA: Diagnosis not present

## 2017-04-23 DIAGNOSIS — D509 Iron deficiency anemia, unspecified: Secondary | ICD-10-CM | POA: Diagnosis not present

## 2017-04-25 DIAGNOSIS — N186 End stage renal disease: Secondary | ICD-10-CM | POA: Diagnosis not present

## 2017-04-25 DIAGNOSIS — N2581 Secondary hyperparathyroidism of renal origin: Secondary | ICD-10-CM | POA: Diagnosis not present

## 2017-04-25 DIAGNOSIS — D631 Anemia in chronic kidney disease: Secondary | ICD-10-CM | POA: Diagnosis not present

## 2017-04-25 DIAGNOSIS — Z992 Dependence on renal dialysis: Secondary | ICD-10-CM | POA: Diagnosis not present

## 2017-04-25 DIAGNOSIS — D509 Iron deficiency anemia, unspecified: Secondary | ICD-10-CM | POA: Diagnosis not present

## 2017-04-27 DIAGNOSIS — N186 End stage renal disease: Secondary | ICD-10-CM | POA: Diagnosis not present

## 2017-04-27 DIAGNOSIS — D631 Anemia in chronic kidney disease: Secondary | ICD-10-CM | POA: Diagnosis not present

## 2017-04-27 DIAGNOSIS — N2581 Secondary hyperparathyroidism of renal origin: Secondary | ICD-10-CM | POA: Diagnosis not present

## 2017-04-27 DIAGNOSIS — D509 Iron deficiency anemia, unspecified: Secondary | ICD-10-CM | POA: Diagnosis not present

## 2017-04-27 DIAGNOSIS — Z992 Dependence on renal dialysis: Secondary | ICD-10-CM | POA: Diagnosis not present

## 2017-04-30 DIAGNOSIS — N2581 Secondary hyperparathyroidism of renal origin: Secondary | ICD-10-CM | POA: Diagnosis not present

## 2017-04-30 DIAGNOSIS — N186 End stage renal disease: Secondary | ICD-10-CM | POA: Diagnosis not present

## 2017-04-30 DIAGNOSIS — D509 Iron deficiency anemia, unspecified: Secondary | ICD-10-CM | POA: Diagnosis not present

## 2017-04-30 DIAGNOSIS — Z992 Dependence on renal dialysis: Secondary | ICD-10-CM | POA: Diagnosis not present

## 2017-04-30 DIAGNOSIS — D631 Anemia in chronic kidney disease: Secondary | ICD-10-CM | POA: Diagnosis not present

## 2017-05-02 DIAGNOSIS — N186 End stage renal disease: Secondary | ICD-10-CM | POA: Diagnosis not present

## 2017-05-02 DIAGNOSIS — Z992 Dependence on renal dialysis: Secondary | ICD-10-CM | POA: Diagnosis not present

## 2017-05-02 DIAGNOSIS — N2581 Secondary hyperparathyroidism of renal origin: Secondary | ICD-10-CM | POA: Diagnosis not present

## 2017-05-02 DIAGNOSIS — D509 Iron deficiency anemia, unspecified: Secondary | ICD-10-CM | POA: Diagnosis not present

## 2017-05-02 DIAGNOSIS — D631 Anemia in chronic kidney disease: Secondary | ICD-10-CM | POA: Diagnosis not present

## 2017-05-04 DIAGNOSIS — D509 Iron deficiency anemia, unspecified: Secondary | ICD-10-CM | POA: Diagnosis not present

## 2017-05-04 DIAGNOSIS — N186 End stage renal disease: Secondary | ICD-10-CM | POA: Diagnosis not present

## 2017-05-04 DIAGNOSIS — D631 Anemia in chronic kidney disease: Secondary | ICD-10-CM | POA: Diagnosis not present

## 2017-05-04 DIAGNOSIS — Z992 Dependence on renal dialysis: Secondary | ICD-10-CM | POA: Diagnosis not present

## 2017-05-04 DIAGNOSIS — N2581 Secondary hyperparathyroidism of renal origin: Secondary | ICD-10-CM | POA: Diagnosis not present

## 2017-05-07 DIAGNOSIS — D509 Iron deficiency anemia, unspecified: Secondary | ICD-10-CM | POA: Diagnosis not present

## 2017-05-07 DIAGNOSIS — N2581 Secondary hyperparathyroidism of renal origin: Secondary | ICD-10-CM | POA: Diagnosis not present

## 2017-05-07 DIAGNOSIS — Z992 Dependence on renal dialysis: Secondary | ICD-10-CM | POA: Diagnosis not present

## 2017-05-07 DIAGNOSIS — N186 End stage renal disease: Secondary | ICD-10-CM | POA: Diagnosis not present

## 2017-05-07 DIAGNOSIS — D631 Anemia in chronic kidney disease: Secondary | ICD-10-CM | POA: Diagnosis not present

## 2017-05-08 ENCOUNTER — Other Ambulatory Visit (HOSPITAL_COMMUNITY): Payer: Self-pay | Admitting: Pulmonary Disease

## 2017-05-08 ENCOUNTER — Ambulatory Visit (HOSPITAL_COMMUNITY)
Admission: RE | Admit: 2017-05-08 | Discharge: 2017-05-08 | Disposition: A | Discharge status: Home / Self Care | Payer: Medicare Other | Source: Ambulatory Visit | Attending: Pulmonary Disease | Admitting: Pulmonary Disease

## 2017-05-08 DIAGNOSIS — N186 End stage renal disease: Secondary | ICD-10-CM | POA: Diagnosis not present

## 2017-05-08 DIAGNOSIS — Z992 Dependence on renal dialysis: Secondary | ICD-10-CM | POA: Diagnosis not present

## 2017-05-08 DIAGNOSIS — J410 Simple chronic bronchitis: Secondary | ICD-10-CM

## 2017-05-08 DIAGNOSIS — I10 Essential (primary) hypertension: Secondary | ICD-10-CM | POA: Diagnosis not present

## 2017-05-08 DIAGNOSIS — J449 Chronic obstructive pulmonary disease, unspecified: Secondary | ICD-10-CM | POA: Diagnosis not present

## 2017-05-09 DIAGNOSIS — D509 Iron deficiency anemia, unspecified: Secondary | ICD-10-CM | POA: Diagnosis not present

## 2017-05-09 DIAGNOSIS — N2581 Secondary hyperparathyroidism of renal origin: Secondary | ICD-10-CM | POA: Diagnosis not present

## 2017-05-09 DIAGNOSIS — D631 Anemia in chronic kidney disease: Secondary | ICD-10-CM | POA: Diagnosis not present

## 2017-05-09 DIAGNOSIS — Z992 Dependence on renal dialysis: Secondary | ICD-10-CM | POA: Diagnosis not present

## 2017-05-09 DIAGNOSIS — N186 End stage renal disease: Secondary | ICD-10-CM | POA: Diagnosis not present

## 2017-05-11 DIAGNOSIS — Z992 Dependence on renal dialysis: Secondary | ICD-10-CM | POA: Diagnosis not present

## 2017-05-11 DIAGNOSIS — D631 Anemia in chronic kidney disease: Secondary | ICD-10-CM | POA: Diagnosis not present

## 2017-05-11 DIAGNOSIS — N186 End stage renal disease: Secondary | ICD-10-CM | POA: Diagnosis not present

## 2017-05-11 DIAGNOSIS — N2581 Secondary hyperparathyroidism of renal origin: Secondary | ICD-10-CM | POA: Diagnosis not present

## 2017-05-11 DIAGNOSIS — D509 Iron deficiency anemia, unspecified: Secondary | ICD-10-CM | POA: Diagnosis not present

## 2017-05-14 DIAGNOSIS — N186 End stage renal disease: Secondary | ICD-10-CM | POA: Diagnosis not present

## 2017-05-14 DIAGNOSIS — N2581 Secondary hyperparathyroidism of renal origin: Secondary | ICD-10-CM | POA: Diagnosis not present

## 2017-05-14 DIAGNOSIS — Z992 Dependence on renal dialysis: Secondary | ICD-10-CM | POA: Diagnosis not present

## 2017-05-14 DIAGNOSIS — D509 Iron deficiency anemia, unspecified: Secondary | ICD-10-CM | POA: Diagnosis not present

## 2017-05-14 DIAGNOSIS — D631 Anemia in chronic kidney disease: Secondary | ICD-10-CM | POA: Diagnosis not present

## 2017-05-16 DIAGNOSIS — N186 End stage renal disease: Secondary | ICD-10-CM | POA: Diagnosis not present

## 2017-05-16 DIAGNOSIS — D631 Anemia in chronic kidney disease: Secondary | ICD-10-CM | POA: Diagnosis not present

## 2017-05-16 DIAGNOSIS — D509 Iron deficiency anemia, unspecified: Secondary | ICD-10-CM | POA: Diagnosis not present

## 2017-05-16 DIAGNOSIS — N2581 Secondary hyperparathyroidism of renal origin: Secondary | ICD-10-CM | POA: Diagnosis not present

## 2017-05-16 DIAGNOSIS — Z992 Dependence on renal dialysis: Secondary | ICD-10-CM | POA: Diagnosis not present

## 2017-05-18 DIAGNOSIS — N186 End stage renal disease: Secondary | ICD-10-CM | POA: Diagnosis not present

## 2017-05-18 DIAGNOSIS — D631 Anemia in chronic kidney disease: Secondary | ICD-10-CM | POA: Diagnosis not present

## 2017-05-18 DIAGNOSIS — N2581 Secondary hyperparathyroidism of renal origin: Secondary | ICD-10-CM | POA: Diagnosis not present

## 2017-05-18 DIAGNOSIS — Z992 Dependence on renal dialysis: Secondary | ICD-10-CM | POA: Diagnosis not present

## 2017-05-18 DIAGNOSIS — D509 Iron deficiency anemia, unspecified: Secondary | ICD-10-CM | POA: Diagnosis not present

## 2017-05-21 DIAGNOSIS — D631 Anemia in chronic kidney disease: Secondary | ICD-10-CM | POA: Diagnosis not present

## 2017-05-21 DIAGNOSIS — N2581 Secondary hyperparathyroidism of renal origin: Secondary | ICD-10-CM | POA: Diagnosis not present

## 2017-05-21 DIAGNOSIS — N186 End stage renal disease: Secondary | ICD-10-CM | POA: Diagnosis not present

## 2017-05-21 DIAGNOSIS — Z992 Dependence on renal dialysis: Secondary | ICD-10-CM | POA: Diagnosis not present

## 2017-05-21 DIAGNOSIS — D509 Iron deficiency anemia, unspecified: Secondary | ICD-10-CM | POA: Diagnosis not present

## 2017-05-23 DIAGNOSIS — Z992 Dependence on renal dialysis: Secondary | ICD-10-CM | POA: Diagnosis not present

## 2017-05-23 DIAGNOSIS — N186 End stage renal disease: Secondary | ICD-10-CM | POA: Diagnosis not present

## 2017-05-23 DIAGNOSIS — D631 Anemia in chronic kidney disease: Secondary | ICD-10-CM | POA: Diagnosis not present

## 2017-05-23 DIAGNOSIS — N2581 Secondary hyperparathyroidism of renal origin: Secondary | ICD-10-CM | POA: Diagnosis not present

## 2017-05-23 DIAGNOSIS — D509 Iron deficiency anemia, unspecified: Secondary | ICD-10-CM | POA: Diagnosis not present

## 2017-05-25 DIAGNOSIS — D631 Anemia in chronic kidney disease: Secondary | ICD-10-CM | POA: Diagnosis not present

## 2017-05-25 DIAGNOSIS — D509 Iron deficiency anemia, unspecified: Secondary | ICD-10-CM | POA: Diagnosis not present

## 2017-05-25 DIAGNOSIS — N186 End stage renal disease: Secondary | ICD-10-CM | POA: Diagnosis not present

## 2017-05-25 DIAGNOSIS — N2581 Secondary hyperparathyroidism of renal origin: Secondary | ICD-10-CM | POA: Diagnosis not present

## 2017-05-25 DIAGNOSIS — Z992 Dependence on renal dialysis: Secondary | ICD-10-CM | POA: Diagnosis not present

## 2017-05-28 DIAGNOSIS — N186 End stage renal disease: Secondary | ICD-10-CM | POA: Diagnosis not present

## 2017-05-28 DIAGNOSIS — D631 Anemia in chronic kidney disease: Secondary | ICD-10-CM | POA: Diagnosis not present

## 2017-05-28 DIAGNOSIS — Z992 Dependence on renal dialysis: Secondary | ICD-10-CM | POA: Diagnosis not present

## 2017-05-28 DIAGNOSIS — D509 Iron deficiency anemia, unspecified: Secondary | ICD-10-CM | POA: Diagnosis not present

## 2017-05-28 DIAGNOSIS — N2581 Secondary hyperparathyroidism of renal origin: Secondary | ICD-10-CM | POA: Diagnosis not present

## 2017-05-30 DIAGNOSIS — Z992 Dependence on renal dialysis: Secondary | ICD-10-CM | POA: Diagnosis not present

## 2017-05-30 DIAGNOSIS — N2581 Secondary hyperparathyroidism of renal origin: Secondary | ICD-10-CM | POA: Diagnosis not present

## 2017-05-30 DIAGNOSIS — D631 Anemia in chronic kidney disease: Secondary | ICD-10-CM | POA: Diagnosis not present

## 2017-05-30 DIAGNOSIS — N186 End stage renal disease: Secondary | ICD-10-CM | POA: Diagnosis not present

## 2017-05-30 DIAGNOSIS — D509 Iron deficiency anemia, unspecified: Secondary | ICD-10-CM | POA: Diagnosis not present

## 2017-06-01 DIAGNOSIS — Z992 Dependence on renal dialysis: Secondary | ICD-10-CM | POA: Diagnosis not present

## 2017-06-01 DIAGNOSIS — N186 End stage renal disease: Secondary | ICD-10-CM | POA: Diagnosis not present

## 2017-06-01 DIAGNOSIS — D631 Anemia in chronic kidney disease: Secondary | ICD-10-CM | POA: Diagnosis not present

## 2017-06-01 DIAGNOSIS — D509 Iron deficiency anemia, unspecified: Secondary | ICD-10-CM | POA: Diagnosis not present

## 2017-06-01 DIAGNOSIS — N2581 Secondary hyperparathyroidism of renal origin: Secondary | ICD-10-CM | POA: Diagnosis not present

## 2017-06-04 DIAGNOSIS — N186 End stage renal disease: Secondary | ICD-10-CM | POA: Diagnosis not present

## 2017-06-04 DIAGNOSIS — Z992 Dependence on renal dialysis: Secondary | ICD-10-CM | POA: Diagnosis not present

## 2017-06-04 DIAGNOSIS — D631 Anemia in chronic kidney disease: Secondary | ICD-10-CM | POA: Diagnosis not present

## 2017-06-04 DIAGNOSIS — N2581 Secondary hyperparathyroidism of renal origin: Secondary | ICD-10-CM | POA: Diagnosis not present

## 2017-06-04 DIAGNOSIS — D509 Iron deficiency anemia, unspecified: Secondary | ICD-10-CM | POA: Diagnosis not present

## 2017-06-06 DIAGNOSIS — N186 End stage renal disease: Secondary | ICD-10-CM | POA: Diagnosis not present

## 2017-06-06 DIAGNOSIS — D509 Iron deficiency anemia, unspecified: Secondary | ICD-10-CM | POA: Diagnosis not present

## 2017-06-06 DIAGNOSIS — N2581 Secondary hyperparathyroidism of renal origin: Secondary | ICD-10-CM | POA: Diagnosis not present

## 2017-06-06 DIAGNOSIS — D631 Anemia in chronic kidney disease: Secondary | ICD-10-CM | POA: Diagnosis not present

## 2017-06-06 DIAGNOSIS — Z992 Dependence on renal dialysis: Secondary | ICD-10-CM | POA: Diagnosis not present

## 2017-06-08 DIAGNOSIS — N186 End stage renal disease: Secondary | ICD-10-CM | POA: Diagnosis not present

## 2017-06-08 DIAGNOSIS — D509 Iron deficiency anemia, unspecified: Secondary | ICD-10-CM | POA: Diagnosis not present

## 2017-06-08 DIAGNOSIS — D631 Anemia in chronic kidney disease: Secondary | ICD-10-CM | POA: Diagnosis not present

## 2017-06-08 DIAGNOSIS — Z992 Dependence on renal dialysis: Secondary | ICD-10-CM | POA: Diagnosis not present

## 2017-06-08 DIAGNOSIS — N2581 Secondary hyperparathyroidism of renal origin: Secondary | ICD-10-CM | POA: Diagnosis not present

## 2017-06-11 DIAGNOSIS — N186 End stage renal disease: Secondary | ICD-10-CM | POA: Diagnosis not present

## 2017-06-11 DIAGNOSIS — D509 Iron deficiency anemia, unspecified: Secondary | ICD-10-CM | POA: Diagnosis not present

## 2017-06-11 DIAGNOSIS — Z992 Dependence on renal dialysis: Secondary | ICD-10-CM | POA: Diagnosis not present

## 2017-06-11 DIAGNOSIS — D631 Anemia in chronic kidney disease: Secondary | ICD-10-CM | POA: Diagnosis not present

## 2017-06-11 DIAGNOSIS — N2581 Secondary hyperparathyroidism of renal origin: Secondary | ICD-10-CM | POA: Diagnosis not present

## 2017-06-13 DIAGNOSIS — D509 Iron deficiency anemia, unspecified: Secondary | ICD-10-CM | POA: Diagnosis not present

## 2017-06-13 DIAGNOSIS — N2581 Secondary hyperparathyroidism of renal origin: Secondary | ICD-10-CM | POA: Diagnosis not present

## 2017-06-13 DIAGNOSIS — Z992 Dependence on renal dialysis: Secondary | ICD-10-CM | POA: Diagnosis not present

## 2017-06-13 DIAGNOSIS — D631 Anemia in chronic kidney disease: Secondary | ICD-10-CM | POA: Diagnosis not present

## 2017-06-13 DIAGNOSIS — N186 End stage renal disease: Secondary | ICD-10-CM | POA: Diagnosis not present

## 2017-06-15 DIAGNOSIS — N2581 Secondary hyperparathyroidism of renal origin: Secondary | ICD-10-CM | POA: Diagnosis not present

## 2017-06-15 DIAGNOSIS — D509 Iron deficiency anemia, unspecified: Secondary | ICD-10-CM | POA: Diagnosis not present

## 2017-06-15 DIAGNOSIS — N186 End stage renal disease: Secondary | ICD-10-CM | POA: Diagnosis not present

## 2017-06-15 DIAGNOSIS — Z992 Dependence on renal dialysis: Secondary | ICD-10-CM | POA: Diagnosis not present

## 2017-06-15 DIAGNOSIS — D631 Anemia in chronic kidney disease: Secondary | ICD-10-CM | POA: Diagnosis not present

## 2017-06-20 DIAGNOSIS — N186 End stage renal disease: Secondary | ICD-10-CM | POA: Diagnosis not present

## 2017-06-20 DIAGNOSIS — D509 Iron deficiency anemia, unspecified: Secondary | ICD-10-CM | POA: Diagnosis not present

## 2017-06-20 DIAGNOSIS — N2581 Secondary hyperparathyroidism of renal origin: Secondary | ICD-10-CM | POA: Diagnosis not present

## 2017-06-20 DIAGNOSIS — Z992 Dependence on renal dialysis: Secondary | ICD-10-CM | POA: Diagnosis not present

## 2017-06-20 DIAGNOSIS — D631 Anemia in chronic kidney disease: Secondary | ICD-10-CM | POA: Diagnosis not present

## 2017-06-21 DIAGNOSIS — N186 End stage renal disease: Secondary | ICD-10-CM | POA: Diagnosis not present

## 2017-06-21 DIAGNOSIS — I739 Peripheral vascular disease, unspecified: Secondary | ICD-10-CM | POA: Diagnosis not present

## 2017-06-21 DIAGNOSIS — Z1389 Encounter for screening for other disorder: Secondary | ICD-10-CM | POA: Diagnosis not present

## 2017-06-21 DIAGNOSIS — F17210 Nicotine dependence, cigarettes, uncomplicated: Secondary | ICD-10-CM | POA: Diagnosis not present

## 2017-06-21 DIAGNOSIS — F17200 Nicotine dependence, unspecified, uncomplicated: Secondary | ICD-10-CM | POA: Diagnosis not present

## 2017-06-21 DIAGNOSIS — J410 Simple chronic bronchitis: Secondary | ICD-10-CM | POA: Diagnosis not present

## 2017-06-22 DIAGNOSIS — Z992 Dependence on renal dialysis: Secondary | ICD-10-CM | POA: Diagnosis not present

## 2017-06-22 DIAGNOSIS — D509 Iron deficiency anemia, unspecified: Secondary | ICD-10-CM | POA: Diagnosis not present

## 2017-06-22 DIAGNOSIS — D631 Anemia in chronic kidney disease: Secondary | ICD-10-CM | POA: Diagnosis not present

## 2017-06-22 DIAGNOSIS — N186 End stage renal disease: Secondary | ICD-10-CM | POA: Diagnosis not present

## 2017-06-22 DIAGNOSIS — N2581 Secondary hyperparathyroidism of renal origin: Secondary | ICD-10-CM | POA: Diagnosis not present

## 2017-06-25 DIAGNOSIS — N186 End stage renal disease: Secondary | ICD-10-CM | POA: Diagnosis not present

## 2017-06-25 DIAGNOSIS — N2581 Secondary hyperparathyroidism of renal origin: Secondary | ICD-10-CM | POA: Diagnosis not present

## 2017-06-25 DIAGNOSIS — Z992 Dependence on renal dialysis: Secondary | ICD-10-CM | POA: Diagnosis not present

## 2017-06-25 DIAGNOSIS — D509 Iron deficiency anemia, unspecified: Secondary | ICD-10-CM | POA: Diagnosis not present

## 2017-06-25 DIAGNOSIS — D631 Anemia in chronic kidney disease: Secondary | ICD-10-CM | POA: Diagnosis not present

## 2017-06-27 DIAGNOSIS — N2581 Secondary hyperparathyroidism of renal origin: Secondary | ICD-10-CM | POA: Diagnosis not present

## 2017-06-27 DIAGNOSIS — N186 End stage renal disease: Secondary | ICD-10-CM | POA: Diagnosis not present

## 2017-06-27 DIAGNOSIS — D509 Iron deficiency anemia, unspecified: Secondary | ICD-10-CM | POA: Diagnosis not present

## 2017-06-27 DIAGNOSIS — D631 Anemia in chronic kidney disease: Secondary | ICD-10-CM | POA: Diagnosis not present

## 2017-06-27 DIAGNOSIS — Z992 Dependence on renal dialysis: Secondary | ICD-10-CM | POA: Diagnosis not present

## 2017-06-29 DIAGNOSIS — N186 End stage renal disease: Secondary | ICD-10-CM | POA: Diagnosis not present

## 2017-06-29 DIAGNOSIS — D509 Iron deficiency anemia, unspecified: Secondary | ICD-10-CM | POA: Diagnosis not present

## 2017-06-29 DIAGNOSIS — N2581 Secondary hyperparathyroidism of renal origin: Secondary | ICD-10-CM | POA: Diagnosis not present

## 2017-06-29 DIAGNOSIS — D631 Anemia in chronic kidney disease: Secondary | ICD-10-CM | POA: Diagnosis not present

## 2017-06-29 DIAGNOSIS — Z992 Dependence on renal dialysis: Secondary | ICD-10-CM | POA: Diagnosis not present

## 2017-07-01 DIAGNOSIS — Z992 Dependence on renal dialysis: Secondary | ICD-10-CM | POA: Diagnosis not present

## 2017-07-01 DIAGNOSIS — D631 Anemia in chronic kidney disease: Secondary | ICD-10-CM | POA: Diagnosis not present

## 2017-07-01 DIAGNOSIS — N2581 Secondary hyperparathyroidism of renal origin: Secondary | ICD-10-CM | POA: Diagnosis not present

## 2017-07-01 DIAGNOSIS — D509 Iron deficiency anemia, unspecified: Secondary | ICD-10-CM | POA: Diagnosis not present

## 2017-07-01 DIAGNOSIS — N186 End stage renal disease: Secondary | ICD-10-CM | POA: Diagnosis not present

## 2017-07-04 DIAGNOSIS — D631 Anemia in chronic kidney disease: Secondary | ICD-10-CM | POA: Diagnosis not present

## 2017-07-04 DIAGNOSIS — D509 Iron deficiency anemia, unspecified: Secondary | ICD-10-CM | POA: Diagnosis not present

## 2017-07-04 DIAGNOSIS — N2581 Secondary hyperparathyroidism of renal origin: Secondary | ICD-10-CM | POA: Diagnosis not present

## 2017-07-04 DIAGNOSIS — N186 End stage renal disease: Secondary | ICD-10-CM | POA: Diagnosis not present

## 2017-07-04 DIAGNOSIS — Z992 Dependence on renal dialysis: Secondary | ICD-10-CM | POA: Diagnosis not present

## 2017-07-06 DIAGNOSIS — N2581 Secondary hyperparathyroidism of renal origin: Secondary | ICD-10-CM | POA: Diagnosis not present

## 2017-07-06 DIAGNOSIS — D631 Anemia in chronic kidney disease: Secondary | ICD-10-CM | POA: Diagnosis not present

## 2017-07-06 DIAGNOSIS — Z992 Dependence on renal dialysis: Secondary | ICD-10-CM | POA: Diagnosis not present

## 2017-07-06 DIAGNOSIS — N186 End stage renal disease: Secondary | ICD-10-CM | POA: Diagnosis not present

## 2017-07-06 DIAGNOSIS — D509 Iron deficiency anemia, unspecified: Secondary | ICD-10-CM | POA: Diagnosis not present

## 2017-07-09 DIAGNOSIS — D631 Anemia in chronic kidney disease: Secondary | ICD-10-CM | POA: Diagnosis not present

## 2017-07-09 DIAGNOSIS — D509 Iron deficiency anemia, unspecified: Secondary | ICD-10-CM | POA: Diagnosis not present

## 2017-07-09 DIAGNOSIS — N2581 Secondary hyperparathyroidism of renal origin: Secondary | ICD-10-CM | POA: Diagnosis not present

## 2017-07-09 DIAGNOSIS — Z992 Dependence on renal dialysis: Secondary | ICD-10-CM | POA: Diagnosis not present

## 2017-07-09 DIAGNOSIS — N186 End stage renal disease: Secondary | ICD-10-CM | POA: Diagnosis not present

## 2017-07-11 DIAGNOSIS — D509 Iron deficiency anemia, unspecified: Secondary | ICD-10-CM | POA: Diagnosis not present

## 2017-07-11 DIAGNOSIS — D631 Anemia in chronic kidney disease: Secondary | ICD-10-CM | POA: Diagnosis not present

## 2017-07-11 DIAGNOSIS — N186 End stage renal disease: Secondary | ICD-10-CM | POA: Diagnosis not present

## 2017-07-11 DIAGNOSIS — N2581 Secondary hyperparathyroidism of renal origin: Secondary | ICD-10-CM | POA: Diagnosis not present

## 2017-07-11 DIAGNOSIS — Z992 Dependence on renal dialysis: Secondary | ICD-10-CM | POA: Diagnosis not present

## 2017-07-13 DIAGNOSIS — D509 Iron deficiency anemia, unspecified: Secondary | ICD-10-CM | POA: Diagnosis not present

## 2017-07-13 DIAGNOSIS — Z992 Dependence on renal dialysis: Secondary | ICD-10-CM | POA: Diagnosis not present

## 2017-07-13 DIAGNOSIS — D631 Anemia in chronic kidney disease: Secondary | ICD-10-CM | POA: Diagnosis not present

## 2017-07-13 DIAGNOSIS — N186 End stage renal disease: Secondary | ICD-10-CM | POA: Diagnosis not present

## 2017-07-13 DIAGNOSIS — N2581 Secondary hyperparathyroidism of renal origin: Secondary | ICD-10-CM | POA: Diagnosis not present

## 2017-07-16 DIAGNOSIS — Z992 Dependence on renal dialysis: Secondary | ICD-10-CM | POA: Diagnosis not present

## 2017-07-16 DIAGNOSIS — N186 End stage renal disease: Secondary | ICD-10-CM | POA: Diagnosis not present

## 2017-07-16 DIAGNOSIS — D509 Iron deficiency anemia, unspecified: Secondary | ICD-10-CM | POA: Diagnosis not present

## 2017-07-16 DIAGNOSIS — D631 Anemia in chronic kidney disease: Secondary | ICD-10-CM | POA: Diagnosis not present

## 2017-07-16 DIAGNOSIS — N2581 Secondary hyperparathyroidism of renal origin: Secondary | ICD-10-CM | POA: Diagnosis not present

## 2017-07-18 DIAGNOSIS — Z992 Dependence on renal dialysis: Secondary | ICD-10-CM | POA: Diagnosis not present

## 2017-07-18 DIAGNOSIS — D631 Anemia in chronic kidney disease: Secondary | ICD-10-CM | POA: Diagnosis not present

## 2017-07-18 DIAGNOSIS — N2581 Secondary hyperparathyroidism of renal origin: Secondary | ICD-10-CM | POA: Diagnosis not present

## 2017-07-18 DIAGNOSIS — N186 End stage renal disease: Secondary | ICD-10-CM | POA: Diagnosis not present

## 2017-07-18 DIAGNOSIS — D509 Iron deficiency anemia, unspecified: Secondary | ICD-10-CM | POA: Diagnosis not present

## 2017-07-20 DIAGNOSIS — N2581 Secondary hyperparathyroidism of renal origin: Secondary | ICD-10-CM | POA: Diagnosis not present

## 2017-07-20 DIAGNOSIS — D631 Anemia in chronic kidney disease: Secondary | ICD-10-CM | POA: Diagnosis not present

## 2017-07-20 DIAGNOSIS — D509 Iron deficiency anemia, unspecified: Secondary | ICD-10-CM | POA: Diagnosis not present

## 2017-07-20 DIAGNOSIS — N186 End stage renal disease: Secondary | ICD-10-CM | POA: Diagnosis not present

## 2017-07-20 DIAGNOSIS — Z992 Dependence on renal dialysis: Secondary | ICD-10-CM | POA: Diagnosis not present

## 2017-07-23 DIAGNOSIS — N186 End stage renal disease: Secondary | ICD-10-CM | POA: Diagnosis not present

## 2017-07-23 DIAGNOSIS — N2581 Secondary hyperparathyroidism of renal origin: Secondary | ICD-10-CM | POA: Diagnosis not present

## 2017-07-23 DIAGNOSIS — D631 Anemia in chronic kidney disease: Secondary | ICD-10-CM | POA: Diagnosis not present

## 2017-07-23 DIAGNOSIS — Z992 Dependence on renal dialysis: Secondary | ICD-10-CM | POA: Diagnosis not present

## 2017-07-23 DIAGNOSIS — D509 Iron deficiency anemia, unspecified: Secondary | ICD-10-CM | POA: Diagnosis not present

## 2017-07-25 DIAGNOSIS — D509 Iron deficiency anemia, unspecified: Secondary | ICD-10-CM | POA: Diagnosis not present

## 2017-07-25 DIAGNOSIS — N186 End stage renal disease: Secondary | ICD-10-CM | POA: Diagnosis not present

## 2017-07-25 DIAGNOSIS — D631 Anemia in chronic kidney disease: Secondary | ICD-10-CM | POA: Diagnosis not present

## 2017-07-25 DIAGNOSIS — N2581 Secondary hyperparathyroidism of renal origin: Secondary | ICD-10-CM | POA: Diagnosis not present

## 2017-07-25 DIAGNOSIS — Z992 Dependence on renal dialysis: Secondary | ICD-10-CM | POA: Diagnosis not present

## 2017-07-27 DIAGNOSIS — N186 End stage renal disease: Secondary | ICD-10-CM | POA: Diagnosis not present

## 2017-07-27 DIAGNOSIS — D631 Anemia in chronic kidney disease: Secondary | ICD-10-CM | POA: Diagnosis not present

## 2017-07-27 DIAGNOSIS — Z992 Dependence on renal dialysis: Secondary | ICD-10-CM | POA: Diagnosis not present

## 2017-07-27 DIAGNOSIS — D509 Iron deficiency anemia, unspecified: Secondary | ICD-10-CM | POA: Diagnosis not present

## 2017-07-27 DIAGNOSIS — N2581 Secondary hyperparathyroidism of renal origin: Secondary | ICD-10-CM | POA: Diagnosis not present

## 2017-07-30 DIAGNOSIS — D631 Anemia in chronic kidney disease: Secondary | ICD-10-CM | POA: Diagnosis not present

## 2017-07-30 DIAGNOSIS — N2581 Secondary hyperparathyroidism of renal origin: Secondary | ICD-10-CM | POA: Diagnosis not present

## 2017-07-30 DIAGNOSIS — Z992 Dependence on renal dialysis: Secondary | ICD-10-CM | POA: Diagnosis not present

## 2017-07-30 DIAGNOSIS — N186 End stage renal disease: Secondary | ICD-10-CM | POA: Diagnosis not present

## 2017-07-30 DIAGNOSIS — D509 Iron deficiency anemia, unspecified: Secondary | ICD-10-CM | POA: Diagnosis not present

## 2017-08-01 DIAGNOSIS — D509 Iron deficiency anemia, unspecified: Secondary | ICD-10-CM | POA: Diagnosis not present

## 2017-08-01 DIAGNOSIS — N186 End stage renal disease: Secondary | ICD-10-CM | POA: Diagnosis not present

## 2017-08-01 DIAGNOSIS — D631 Anemia in chronic kidney disease: Secondary | ICD-10-CM | POA: Diagnosis not present

## 2017-08-01 DIAGNOSIS — N2581 Secondary hyperparathyroidism of renal origin: Secondary | ICD-10-CM | POA: Diagnosis not present

## 2017-08-01 DIAGNOSIS — Z992 Dependence on renal dialysis: Secondary | ICD-10-CM | POA: Diagnosis not present

## 2017-08-03 DIAGNOSIS — D631 Anemia in chronic kidney disease: Secondary | ICD-10-CM | POA: Diagnosis not present

## 2017-08-03 DIAGNOSIS — D509 Iron deficiency anemia, unspecified: Secondary | ICD-10-CM | POA: Diagnosis not present

## 2017-08-03 DIAGNOSIS — N186 End stage renal disease: Secondary | ICD-10-CM | POA: Diagnosis not present

## 2017-08-03 DIAGNOSIS — N2581 Secondary hyperparathyroidism of renal origin: Secondary | ICD-10-CM | POA: Diagnosis not present

## 2017-08-03 DIAGNOSIS — Z992 Dependence on renal dialysis: Secondary | ICD-10-CM | POA: Diagnosis not present

## 2017-08-06 DIAGNOSIS — D631 Anemia in chronic kidney disease: Secondary | ICD-10-CM | POA: Diagnosis not present

## 2017-08-06 DIAGNOSIS — Z992 Dependence on renal dialysis: Secondary | ICD-10-CM | POA: Diagnosis not present

## 2017-08-06 DIAGNOSIS — N186 End stage renal disease: Secondary | ICD-10-CM | POA: Diagnosis not present

## 2017-08-06 DIAGNOSIS — D509 Iron deficiency anemia, unspecified: Secondary | ICD-10-CM | POA: Diagnosis not present

## 2017-08-06 DIAGNOSIS — N2581 Secondary hyperparathyroidism of renal origin: Secondary | ICD-10-CM | POA: Diagnosis not present

## 2017-08-08 DIAGNOSIS — N2581 Secondary hyperparathyroidism of renal origin: Secondary | ICD-10-CM | POA: Diagnosis not present

## 2017-08-08 DIAGNOSIS — D631 Anemia in chronic kidney disease: Secondary | ICD-10-CM | POA: Diagnosis not present

## 2017-08-08 DIAGNOSIS — N186 End stage renal disease: Secondary | ICD-10-CM | POA: Diagnosis not present

## 2017-08-08 DIAGNOSIS — Z992 Dependence on renal dialysis: Secondary | ICD-10-CM | POA: Diagnosis not present

## 2017-08-08 DIAGNOSIS — D509 Iron deficiency anemia, unspecified: Secondary | ICD-10-CM | POA: Diagnosis not present

## 2017-08-09 DIAGNOSIS — N186 End stage renal disease: Secondary | ICD-10-CM | POA: Diagnosis not present

## 2017-08-09 DIAGNOSIS — Z992 Dependence on renal dialysis: Secondary | ICD-10-CM | POA: Diagnosis not present

## 2017-08-10 DIAGNOSIS — N2581 Secondary hyperparathyroidism of renal origin: Secondary | ICD-10-CM | POA: Diagnosis not present

## 2017-08-10 DIAGNOSIS — D509 Iron deficiency anemia, unspecified: Secondary | ICD-10-CM | POA: Diagnosis not present

## 2017-08-10 DIAGNOSIS — N186 End stage renal disease: Secondary | ICD-10-CM | POA: Diagnosis not present

## 2017-08-10 DIAGNOSIS — D631 Anemia in chronic kidney disease: Secondary | ICD-10-CM | POA: Diagnosis not present

## 2017-08-10 DIAGNOSIS — Z992 Dependence on renal dialysis: Secondary | ICD-10-CM | POA: Diagnosis not present

## 2017-08-13 DIAGNOSIS — D631 Anemia in chronic kidney disease: Secondary | ICD-10-CM | POA: Diagnosis not present

## 2017-08-13 DIAGNOSIS — N186 End stage renal disease: Secondary | ICD-10-CM | POA: Diagnosis not present

## 2017-08-13 DIAGNOSIS — N2581 Secondary hyperparathyroidism of renal origin: Secondary | ICD-10-CM | POA: Diagnosis not present

## 2017-08-13 DIAGNOSIS — Z992 Dependence on renal dialysis: Secondary | ICD-10-CM | POA: Diagnosis not present

## 2017-08-13 DIAGNOSIS — D509 Iron deficiency anemia, unspecified: Secondary | ICD-10-CM | POA: Diagnosis not present

## 2017-08-15 DIAGNOSIS — D631 Anemia in chronic kidney disease: Secondary | ICD-10-CM | POA: Diagnosis not present

## 2017-08-15 DIAGNOSIS — D509 Iron deficiency anemia, unspecified: Secondary | ICD-10-CM | POA: Diagnosis not present

## 2017-08-15 DIAGNOSIS — N2581 Secondary hyperparathyroidism of renal origin: Secondary | ICD-10-CM | POA: Diagnosis not present

## 2017-08-15 DIAGNOSIS — N186 End stage renal disease: Secondary | ICD-10-CM | POA: Diagnosis not present

## 2017-08-15 DIAGNOSIS — Z992 Dependence on renal dialysis: Secondary | ICD-10-CM | POA: Diagnosis not present

## 2017-08-17 DIAGNOSIS — D509 Iron deficiency anemia, unspecified: Secondary | ICD-10-CM | POA: Diagnosis not present

## 2017-08-17 DIAGNOSIS — D631 Anemia in chronic kidney disease: Secondary | ICD-10-CM | POA: Diagnosis not present

## 2017-08-17 DIAGNOSIS — N186 End stage renal disease: Secondary | ICD-10-CM | POA: Diagnosis not present

## 2017-08-17 DIAGNOSIS — Z992 Dependence on renal dialysis: Secondary | ICD-10-CM | POA: Diagnosis not present

## 2017-08-17 DIAGNOSIS — N2581 Secondary hyperparathyroidism of renal origin: Secondary | ICD-10-CM | POA: Diagnosis not present

## 2017-08-20 DIAGNOSIS — N186 End stage renal disease: Secondary | ICD-10-CM | POA: Diagnosis not present

## 2017-08-20 DIAGNOSIS — Z992 Dependence on renal dialysis: Secondary | ICD-10-CM | POA: Diagnosis not present

## 2017-08-20 DIAGNOSIS — D509 Iron deficiency anemia, unspecified: Secondary | ICD-10-CM | POA: Diagnosis not present

## 2017-08-20 DIAGNOSIS — N2581 Secondary hyperparathyroidism of renal origin: Secondary | ICD-10-CM | POA: Diagnosis not present

## 2017-08-20 DIAGNOSIS — D631 Anemia in chronic kidney disease: Secondary | ICD-10-CM | POA: Diagnosis not present

## 2017-08-22 DIAGNOSIS — N2581 Secondary hyperparathyroidism of renal origin: Secondary | ICD-10-CM | POA: Diagnosis not present

## 2017-08-22 DIAGNOSIS — D509 Iron deficiency anemia, unspecified: Secondary | ICD-10-CM | POA: Diagnosis not present

## 2017-08-22 DIAGNOSIS — Z992 Dependence on renal dialysis: Secondary | ICD-10-CM | POA: Diagnosis not present

## 2017-08-22 DIAGNOSIS — N186 End stage renal disease: Secondary | ICD-10-CM | POA: Diagnosis not present

## 2017-08-22 DIAGNOSIS — D631 Anemia in chronic kidney disease: Secondary | ICD-10-CM | POA: Diagnosis not present

## 2017-08-24 DIAGNOSIS — Z992 Dependence on renal dialysis: Secondary | ICD-10-CM | POA: Diagnosis not present

## 2017-08-24 DIAGNOSIS — N186 End stage renal disease: Secondary | ICD-10-CM | POA: Diagnosis not present

## 2017-08-24 DIAGNOSIS — D509 Iron deficiency anemia, unspecified: Secondary | ICD-10-CM | POA: Diagnosis not present

## 2017-08-24 DIAGNOSIS — N2581 Secondary hyperparathyroidism of renal origin: Secondary | ICD-10-CM | POA: Diagnosis not present

## 2017-08-24 DIAGNOSIS — D631 Anemia in chronic kidney disease: Secondary | ICD-10-CM | POA: Diagnosis not present

## 2017-08-27 ENCOUNTER — Other Ambulatory Visit (HOSPITAL_COMMUNITY): Payer: Self-pay | Admitting: Pulmonary Disease

## 2017-08-27 ENCOUNTER — Ambulatory Visit (HOSPITAL_COMMUNITY)
Admission: RE | Admit: 2017-08-27 | Discharge: 2017-08-27 | Disposition: A | Discharge status: Home / Self Care | Payer: Medicare Other | Source: Ambulatory Visit | Attending: Pulmonary Disease | Admitting: Pulmonary Disease

## 2017-08-27 DIAGNOSIS — J449 Chronic obstructive pulmonary disease, unspecified: Secondary | ICD-10-CM | POA: Diagnosis not present

## 2017-08-27 DIAGNOSIS — N2581 Secondary hyperparathyroidism of renal origin: Secondary | ICD-10-CM | POA: Diagnosis not present

## 2017-08-27 DIAGNOSIS — D509 Iron deficiency anemia, unspecified: Secondary | ICD-10-CM | POA: Diagnosis not present

## 2017-08-27 DIAGNOSIS — D631 Anemia in chronic kidney disease: Secondary | ICD-10-CM | POA: Diagnosis not present

## 2017-08-27 DIAGNOSIS — R079 Chest pain, unspecified: Secondary | ICD-10-CM | POA: Diagnosis not present

## 2017-08-27 DIAGNOSIS — Z992 Dependence on renal dialysis: Secondary | ICD-10-CM | POA: Diagnosis not present

## 2017-08-27 DIAGNOSIS — N186 End stage renal disease: Secondary | ICD-10-CM | POA: Diagnosis not present

## 2017-08-29 DIAGNOSIS — N186 End stage renal disease: Secondary | ICD-10-CM | POA: Diagnosis not present

## 2017-08-29 DIAGNOSIS — D509 Iron deficiency anemia, unspecified: Secondary | ICD-10-CM | POA: Diagnosis not present

## 2017-08-29 DIAGNOSIS — D631 Anemia in chronic kidney disease: Secondary | ICD-10-CM | POA: Diagnosis not present

## 2017-08-29 DIAGNOSIS — N2581 Secondary hyperparathyroidism of renal origin: Secondary | ICD-10-CM | POA: Diagnosis not present

## 2017-08-29 DIAGNOSIS — Z992 Dependence on renal dialysis: Secondary | ICD-10-CM | POA: Diagnosis not present

## 2017-08-31 DIAGNOSIS — Z992 Dependence on renal dialysis: Secondary | ICD-10-CM | POA: Diagnosis not present

## 2017-08-31 DIAGNOSIS — D631 Anemia in chronic kidney disease: Secondary | ICD-10-CM | POA: Diagnosis not present

## 2017-08-31 DIAGNOSIS — D509 Iron deficiency anemia, unspecified: Secondary | ICD-10-CM | POA: Diagnosis not present

## 2017-08-31 DIAGNOSIS — N2581 Secondary hyperparathyroidism of renal origin: Secondary | ICD-10-CM | POA: Diagnosis not present

## 2017-08-31 DIAGNOSIS — N186 End stage renal disease: Secondary | ICD-10-CM | POA: Diagnosis not present

## 2017-09-03 DIAGNOSIS — D631 Anemia in chronic kidney disease: Secondary | ICD-10-CM | POA: Diagnosis not present

## 2017-09-03 DIAGNOSIS — N186 End stage renal disease: Secondary | ICD-10-CM | POA: Diagnosis not present

## 2017-09-03 DIAGNOSIS — N2581 Secondary hyperparathyroidism of renal origin: Secondary | ICD-10-CM | POA: Diagnosis not present

## 2017-09-03 DIAGNOSIS — Z992 Dependence on renal dialysis: Secondary | ICD-10-CM | POA: Diagnosis not present

## 2017-09-03 DIAGNOSIS — D509 Iron deficiency anemia, unspecified: Secondary | ICD-10-CM | POA: Diagnosis not present

## 2017-09-05 DIAGNOSIS — Z992 Dependence on renal dialysis: Secondary | ICD-10-CM | POA: Diagnosis not present

## 2017-09-05 DIAGNOSIS — N2581 Secondary hyperparathyroidism of renal origin: Secondary | ICD-10-CM | POA: Diagnosis not present

## 2017-09-05 DIAGNOSIS — D631 Anemia in chronic kidney disease: Secondary | ICD-10-CM | POA: Diagnosis not present

## 2017-09-05 DIAGNOSIS — D509 Iron deficiency anemia, unspecified: Secondary | ICD-10-CM | POA: Diagnosis not present

## 2017-09-05 DIAGNOSIS — N186 End stage renal disease: Secondary | ICD-10-CM | POA: Diagnosis not present

## 2017-09-06 DIAGNOSIS — Z992 Dependence on renal dialysis: Secondary | ICD-10-CM | POA: Diagnosis not present

## 2017-09-06 DIAGNOSIS — N186 End stage renal disease: Secondary | ICD-10-CM | POA: Diagnosis not present

## 2017-09-07 DIAGNOSIS — Z992 Dependence on renal dialysis: Secondary | ICD-10-CM | POA: Diagnosis not present

## 2017-09-07 DIAGNOSIS — N2581 Secondary hyperparathyroidism of renal origin: Secondary | ICD-10-CM | POA: Diagnosis not present

## 2017-09-07 DIAGNOSIS — D509 Iron deficiency anemia, unspecified: Secondary | ICD-10-CM | POA: Diagnosis not present

## 2017-09-07 DIAGNOSIS — N186 End stage renal disease: Secondary | ICD-10-CM | POA: Diagnosis not present

## 2017-09-07 DIAGNOSIS — D631 Anemia in chronic kidney disease: Secondary | ICD-10-CM | POA: Diagnosis not present

## 2017-09-10 DIAGNOSIS — Z992 Dependence on renal dialysis: Secondary | ICD-10-CM | POA: Diagnosis not present

## 2017-09-10 DIAGNOSIS — D631 Anemia in chronic kidney disease: Secondary | ICD-10-CM | POA: Diagnosis not present

## 2017-09-10 DIAGNOSIS — N186 End stage renal disease: Secondary | ICD-10-CM | POA: Diagnosis not present

## 2017-09-10 DIAGNOSIS — D509 Iron deficiency anemia, unspecified: Secondary | ICD-10-CM | POA: Diagnosis not present

## 2017-09-10 DIAGNOSIS — N2581 Secondary hyperparathyroidism of renal origin: Secondary | ICD-10-CM | POA: Diagnosis not present

## 2017-09-12 DIAGNOSIS — D509 Iron deficiency anemia, unspecified: Secondary | ICD-10-CM | POA: Diagnosis not present

## 2017-09-12 DIAGNOSIS — N186 End stage renal disease: Secondary | ICD-10-CM | POA: Diagnosis not present

## 2017-09-12 DIAGNOSIS — D631 Anemia in chronic kidney disease: Secondary | ICD-10-CM | POA: Diagnosis not present

## 2017-09-12 DIAGNOSIS — N2581 Secondary hyperparathyroidism of renal origin: Secondary | ICD-10-CM | POA: Diagnosis not present

## 2017-09-12 DIAGNOSIS — Z992 Dependence on renal dialysis: Secondary | ICD-10-CM | POA: Diagnosis not present

## 2017-09-13 DIAGNOSIS — H40003 Preglaucoma, unspecified, bilateral: Secondary | ICD-10-CM | POA: Diagnosis not present

## 2017-09-13 DIAGNOSIS — H2513 Age-related nuclear cataract, bilateral: Secondary | ICD-10-CM | POA: Diagnosis not present

## 2017-09-14 DIAGNOSIS — Z992 Dependence on renal dialysis: Secondary | ICD-10-CM | POA: Diagnosis not present

## 2017-09-14 DIAGNOSIS — N2581 Secondary hyperparathyroidism of renal origin: Secondary | ICD-10-CM | POA: Diagnosis not present

## 2017-09-14 DIAGNOSIS — N186 End stage renal disease: Secondary | ICD-10-CM | POA: Diagnosis not present

## 2017-09-14 DIAGNOSIS — D509 Iron deficiency anemia, unspecified: Secondary | ICD-10-CM | POA: Diagnosis not present

## 2017-09-14 DIAGNOSIS — D631 Anemia in chronic kidney disease: Secondary | ICD-10-CM | POA: Diagnosis not present

## 2017-09-17 DIAGNOSIS — N2581 Secondary hyperparathyroidism of renal origin: Secondary | ICD-10-CM | POA: Diagnosis not present

## 2017-09-17 DIAGNOSIS — N186 End stage renal disease: Secondary | ICD-10-CM | POA: Diagnosis not present

## 2017-09-17 DIAGNOSIS — Z992 Dependence on renal dialysis: Secondary | ICD-10-CM | POA: Diagnosis not present

## 2017-09-17 DIAGNOSIS — D509 Iron deficiency anemia, unspecified: Secondary | ICD-10-CM | POA: Diagnosis not present

## 2017-09-17 DIAGNOSIS — D631 Anemia in chronic kidney disease: Secondary | ICD-10-CM | POA: Diagnosis not present

## 2017-09-19 DIAGNOSIS — N2581 Secondary hyperparathyroidism of renal origin: Secondary | ICD-10-CM | POA: Diagnosis not present

## 2017-09-19 DIAGNOSIS — D509 Iron deficiency anemia, unspecified: Secondary | ICD-10-CM | POA: Diagnosis not present

## 2017-09-19 DIAGNOSIS — Z992 Dependence on renal dialysis: Secondary | ICD-10-CM | POA: Diagnosis not present

## 2017-09-19 DIAGNOSIS — D631 Anemia in chronic kidney disease: Secondary | ICD-10-CM | POA: Diagnosis not present

## 2017-09-19 DIAGNOSIS — N186 End stage renal disease: Secondary | ICD-10-CM | POA: Diagnosis not present

## 2017-09-21 DIAGNOSIS — N2581 Secondary hyperparathyroidism of renal origin: Secondary | ICD-10-CM | POA: Diagnosis not present

## 2017-09-21 DIAGNOSIS — Z992 Dependence on renal dialysis: Secondary | ICD-10-CM | POA: Diagnosis not present

## 2017-09-21 DIAGNOSIS — N186 End stage renal disease: Secondary | ICD-10-CM | POA: Diagnosis not present

## 2017-09-21 DIAGNOSIS — D631 Anemia in chronic kidney disease: Secondary | ICD-10-CM | POA: Diagnosis not present

## 2017-09-21 DIAGNOSIS — D509 Iron deficiency anemia, unspecified: Secondary | ICD-10-CM | POA: Diagnosis not present

## 2017-09-24 DIAGNOSIS — D631 Anemia in chronic kidney disease: Secondary | ICD-10-CM | POA: Diagnosis not present

## 2017-09-24 DIAGNOSIS — N2581 Secondary hyperparathyroidism of renal origin: Secondary | ICD-10-CM | POA: Diagnosis not present

## 2017-09-24 DIAGNOSIS — D509 Iron deficiency anemia, unspecified: Secondary | ICD-10-CM | POA: Diagnosis not present

## 2017-09-24 DIAGNOSIS — Z992 Dependence on renal dialysis: Secondary | ICD-10-CM | POA: Diagnosis not present

## 2017-09-24 DIAGNOSIS — N186 End stage renal disease: Secondary | ICD-10-CM | POA: Diagnosis not present

## 2017-09-25 DIAGNOSIS — I10 Essential (primary) hypertension: Secondary | ICD-10-CM | POA: Diagnosis not present

## 2017-09-25 DIAGNOSIS — N186 End stage renal disease: Secondary | ICD-10-CM | POA: Diagnosis not present

## 2017-09-25 DIAGNOSIS — I739 Peripheral vascular disease, unspecified: Secondary | ICD-10-CM | POA: Diagnosis not present

## 2017-09-25 DIAGNOSIS — M539 Dorsopathy, unspecified: Secondary | ICD-10-CM | POA: Diagnosis not present

## 2017-09-26 DIAGNOSIS — N186 End stage renal disease: Secondary | ICD-10-CM | POA: Diagnosis not present

## 2017-09-26 DIAGNOSIS — Z992 Dependence on renal dialysis: Secondary | ICD-10-CM | POA: Diagnosis not present

## 2017-09-26 DIAGNOSIS — D631 Anemia in chronic kidney disease: Secondary | ICD-10-CM | POA: Diagnosis not present

## 2017-09-26 DIAGNOSIS — D509 Iron deficiency anemia, unspecified: Secondary | ICD-10-CM | POA: Diagnosis not present

## 2017-09-26 DIAGNOSIS — N2581 Secondary hyperparathyroidism of renal origin: Secondary | ICD-10-CM | POA: Diagnosis not present

## 2017-09-28 DIAGNOSIS — N2581 Secondary hyperparathyroidism of renal origin: Secondary | ICD-10-CM | POA: Diagnosis not present

## 2017-09-28 DIAGNOSIS — D509 Iron deficiency anemia, unspecified: Secondary | ICD-10-CM | POA: Diagnosis not present

## 2017-09-28 DIAGNOSIS — D631 Anemia in chronic kidney disease: Secondary | ICD-10-CM | POA: Diagnosis not present

## 2017-09-28 DIAGNOSIS — N186 End stage renal disease: Secondary | ICD-10-CM | POA: Diagnosis not present

## 2017-09-28 DIAGNOSIS — Z992 Dependence on renal dialysis: Secondary | ICD-10-CM | POA: Diagnosis not present

## 2017-10-01 DIAGNOSIS — D509 Iron deficiency anemia, unspecified: Secondary | ICD-10-CM | POA: Diagnosis not present

## 2017-10-01 DIAGNOSIS — N2581 Secondary hyperparathyroidism of renal origin: Secondary | ICD-10-CM | POA: Diagnosis not present

## 2017-10-01 DIAGNOSIS — N186 End stage renal disease: Secondary | ICD-10-CM | POA: Diagnosis not present

## 2017-10-01 DIAGNOSIS — Z992 Dependence on renal dialysis: Secondary | ICD-10-CM | POA: Diagnosis not present

## 2017-10-01 DIAGNOSIS — D631 Anemia in chronic kidney disease: Secondary | ICD-10-CM | POA: Diagnosis not present

## 2017-10-03 DIAGNOSIS — N2581 Secondary hyperparathyroidism of renal origin: Secondary | ICD-10-CM | POA: Diagnosis not present

## 2017-10-03 DIAGNOSIS — D509 Iron deficiency anemia, unspecified: Secondary | ICD-10-CM | POA: Diagnosis not present

## 2017-10-03 DIAGNOSIS — N186 End stage renal disease: Secondary | ICD-10-CM | POA: Diagnosis not present

## 2017-10-03 DIAGNOSIS — D631 Anemia in chronic kidney disease: Secondary | ICD-10-CM | POA: Diagnosis not present

## 2017-10-03 DIAGNOSIS — Z992 Dependence on renal dialysis: Secondary | ICD-10-CM | POA: Diagnosis not present

## 2017-10-05 DIAGNOSIS — D509 Iron deficiency anemia, unspecified: Secondary | ICD-10-CM | POA: Diagnosis not present

## 2017-10-05 DIAGNOSIS — N2581 Secondary hyperparathyroidism of renal origin: Secondary | ICD-10-CM | POA: Diagnosis not present

## 2017-10-05 DIAGNOSIS — D631 Anemia in chronic kidney disease: Secondary | ICD-10-CM | POA: Diagnosis not present

## 2017-10-05 DIAGNOSIS — Z992 Dependence on renal dialysis: Secondary | ICD-10-CM | POA: Diagnosis not present

## 2017-10-05 DIAGNOSIS — N186 End stage renal disease: Secondary | ICD-10-CM | POA: Diagnosis not present

## 2017-10-07 DIAGNOSIS — N186 End stage renal disease: Secondary | ICD-10-CM | POA: Diagnosis not present

## 2017-10-07 DIAGNOSIS — Z992 Dependence on renal dialysis: Secondary | ICD-10-CM | POA: Diagnosis not present

## 2017-10-08 DIAGNOSIS — D631 Anemia in chronic kidney disease: Secondary | ICD-10-CM | POA: Diagnosis not present

## 2017-10-08 DIAGNOSIS — N186 End stage renal disease: Secondary | ICD-10-CM | POA: Diagnosis not present

## 2017-10-08 DIAGNOSIS — D509 Iron deficiency anemia, unspecified: Secondary | ICD-10-CM | POA: Diagnosis not present

## 2017-10-08 DIAGNOSIS — Z992 Dependence on renal dialysis: Secondary | ICD-10-CM | POA: Diagnosis not present

## 2017-10-08 DIAGNOSIS — N2581 Secondary hyperparathyroidism of renal origin: Secondary | ICD-10-CM | POA: Diagnosis not present

## 2017-10-10 DIAGNOSIS — N2581 Secondary hyperparathyroidism of renal origin: Secondary | ICD-10-CM | POA: Diagnosis not present

## 2017-10-10 DIAGNOSIS — D631 Anemia in chronic kidney disease: Secondary | ICD-10-CM | POA: Diagnosis not present

## 2017-10-10 DIAGNOSIS — Z992 Dependence on renal dialysis: Secondary | ICD-10-CM | POA: Diagnosis not present

## 2017-10-10 DIAGNOSIS — N186 End stage renal disease: Secondary | ICD-10-CM | POA: Diagnosis not present

## 2017-10-10 DIAGNOSIS — D509 Iron deficiency anemia, unspecified: Secondary | ICD-10-CM | POA: Diagnosis not present

## 2017-10-12 DIAGNOSIS — N2581 Secondary hyperparathyroidism of renal origin: Secondary | ICD-10-CM | POA: Diagnosis not present

## 2017-10-12 DIAGNOSIS — D631 Anemia in chronic kidney disease: Secondary | ICD-10-CM | POA: Diagnosis not present

## 2017-10-12 DIAGNOSIS — N186 End stage renal disease: Secondary | ICD-10-CM | POA: Diagnosis not present

## 2017-10-12 DIAGNOSIS — Z992 Dependence on renal dialysis: Secondary | ICD-10-CM | POA: Diagnosis not present

## 2017-10-12 DIAGNOSIS — D509 Iron deficiency anemia, unspecified: Secondary | ICD-10-CM | POA: Diagnosis not present

## 2017-10-17 DIAGNOSIS — Z992 Dependence on renal dialysis: Secondary | ICD-10-CM | POA: Diagnosis not present

## 2017-10-17 DIAGNOSIS — N186 End stage renal disease: Secondary | ICD-10-CM | POA: Diagnosis not present

## 2017-10-17 DIAGNOSIS — D509 Iron deficiency anemia, unspecified: Secondary | ICD-10-CM | POA: Diagnosis not present

## 2017-10-17 DIAGNOSIS — D631 Anemia in chronic kidney disease: Secondary | ICD-10-CM | POA: Diagnosis not present

## 2017-10-17 DIAGNOSIS — N2581 Secondary hyperparathyroidism of renal origin: Secondary | ICD-10-CM | POA: Diagnosis not present

## 2017-10-22 DIAGNOSIS — Z992 Dependence on renal dialysis: Secondary | ICD-10-CM | POA: Diagnosis not present

## 2017-10-22 DIAGNOSIS — N186 End stage renal disease: Secondary | ICD-10-CM | POA: Diagnosis not present

## 2017-10-22 DIAGNOSIS — D509 Iron deficiency anemia, unspecified: Secondary | ICD-10-CM | POA: Diagnosis not present

## 2017-10-22 DIAGNOSIS — N2581 Secondary hyperparathyroidism of renal origin: Secondary | ICD-10-CM | POA: Diagnosis not present

## 2017-10-22 DIAGNOSIS — D631 Anemia in chronic kidney disease: Secondary | ICD-10-CM | POA: Diagnosis not present

## 2017-10-23 DIAGNOSIS — J449 Chronic obstructive pulmonary disease, unspecified: Secondary | ICD-10-CM | POA: Diagnosis not present

## 2017-10-23 DIAGNOSIS — M545 Low back pain: Secondary | ICD-10-CM | POA: Diagnosis not present

## 2017-10-23 DIAGNOSIS — I120 Hypertensive chronic kidney disease with stage 5 chronic kidney disease or end stage renal disease: Secondary | ICD-10-CM | POA: Diagnosis not present

## 2017-10-23 DIAGNOSIS — I10 Essential (primary) hypertension: Secondary | ICD-10-CM | POA: Diagnosis not present

## 2017-10-24 DIAGNOSIS — D631 Anemia in chronic kidney disease: Secondary | ICD-10-CM | POA: Diagnosis not present

## 2017-10-24 DIAGNOSIS — N186 End stage renal disease: Secondary | ICD-10-CM | POA: Diagnosis not present

## 2017-10-24 DIAGNOSIS — N2581 Secondary hyperparathyroidism of renal origin: Secondary | ICD-10-CM | POA: Diagnosis not present

## 2017-10-24 DIAGNOSIS — Z992 Dependence on renal dialysis: Secondary | ICD-10-CM | POA: Diagnosis not present

## 2017-10-24 DIAGNOSIS — D509 Iron deficiency anemia, unspecified: Secondary | ICD-10-CM | POA: Diagnosis not present

## 2017-10-26 DIAGNOSIS — D509 Iron deficiency anemia, unspecified: Secondary | ICD-10-CM | POA: Diagnosis not present

## 2017-10-26 DIAGNOSIS — D631 Anemia in chronic kidney disease: Secondary | ICD-10-CM | POA: Diagnosis not present

## 2017-10-26 DIAGNOSIS — Z992 Dependence on renal dialysis: Secondary | ICD-10-CM | POA: Diagnosis not present

## 2017-10-26 DIAGNOSIS — N186 End stage renal disease: Secondary | ICD-10-CM | POA: Diagnosis not present

## 2017-10-26 DIAGNOSIS — N2581 Secondary hyperparathyroidism of renal origin: Secondary | ICD-10-CM | POA: Diagnosis not present

## 2017-10-29 DIAGNOSIS — Z992 Dependence on renal dialysis: Secondary | ICD-10-CM | POA: Diagnosis not present

## 2017-10-29 DIAGNOSIS — N186 End stage renal disease: Secondary | ICD-10-CM | POA: Diagnosis not present

## 2017-10-29 DIAGNOSIS — N2581 Secondary hyperparathyroidism of renal origin: Secondary | ICD-10-CM | POA: Diagnosis not present

## 2017-10-29 DIAGNOSIS — D631 Anemia in chronic kidney disease: Secondary | ICD-10-CM | POA: Diagnosis not present

## 2017-10-29 DIAGNOSIS — D509 Iron deficiency anemia, unspecified: Secondary | ICD-10-CM | POA: Diagnosis not present

## 2017-10-31 DIAGNOSIS — N2581 Secondary hyperparathyroidism of renal origin: Secondary | ICD-10-CM | POA: Diagnosis not present

## 2017-10-31 DIAGNOSIS — D509 Iron deficiency anemia, unspecified: Secondary | ICD-10-CM | POA: Diagnosis not present

## 2017-10-31 DIAGNOSIS — D631 Anemia in chronic kidney disease: Secondary | ICD-10-CM | POA: Diagnosis not present

## 2017-10-31 DIAGNOSIS — N186 End stage renal disease: Secondary | ICD-10-CM | POA: Diagnosis not present

## 2017-10-31 DIAGNOSIS — Z992 Dependence on renal dialysis: Secondary | ICD-10-CM | POA: Diagnosis not present

## 2017-11-02 DIAGNOSIS — N2581 Secondary hyperparathyroidism of renal origin: Secondary | ICD-10-CM | POA: Diagnosis not present

## 2017-11-02 DIAGNOSIS — Z992 Dependence on renal dialysis: Secondary | ICD-10-CM | POA: Diagnosis not present

## 2017-11-02 DIAGNOSIS — N186 End stage renal disease: Secondary | ICD-10-CM | POA: Diagnosis not present

## 2017-11-02 DIAGNOSIS — D509 Iron deficiency anemia, unspecified: Secondary | ICD-10-CM | POA: Diagnosis not present

## 2017-11-02 DIAGNOSIS — D631 Anemia in chronic kidney disease: Secondary | ICD-10-CM | POA: Diagnosis not present

## 2017-11-05 DIAGNOSIS — Z992 Dependence on renal dialysis: Secondary | ICD-10-CM | POA: Diagnosis not present

## 2017-11-05 DIAGNOSIS — N2581 Secondary hyperparathyroidism of renal origin: Secondary | ICD-10-CM | POA: Diagnosis not present

## 2017-11-05 DIAGNOSIS — D631 Anemia in chronic kidney disease: Secondary | ICD-10-CM | POA: Diagnosis not present

## 2017-11-05 DIAGNOSIS — N186 End stage renal disease: Secondary | ICD-10-CM | POA: Diagnosis not present

## 2017-11-05 DIAGNOSIS — D509 Iron deficiency anemia, unspecified: Secondary | ICD-10-CM | POA: Diagnosis not present

## 2017-11-06 DIAGNOSIS — Z992 Dependence on renal dialysis: Secondary | ICD-10-CM | POA: Diagnosis not present

## 2017-11-06 DIAGNOSIS — N186 End stage renal disease: Secondary | ICD-10-CM | POA: Diagnosis not present

## 2017-11-07 DIAGNOSIS — N2581 Secondary hyperparathyroidism of renal origin: Secondary | ICD-10-CM | POA: Diagnosis not present

## 2017-11-07 DIAGNOSIS — D631 Anemia in chronic kidney disease: Secondary | ICD-10-CM | POA: Diagnosis not present

## 2017-11-07 DIAGNOSIS — Z992 Dependence on renal dialysis: Secondary | ICD-10-CM | POA: Diagnosis not present

## 2017-11-07 DIAGNOSIS — N186 End stage renal disease: Secondary | ICD-10-CM | POA: Diagnosis not present

## 2017-11-07 DIAGNOSIS — D509 Iron deficiency anemia, unspecified: Secondary | ICD-10-CM | POA: Diagnosis not present

## 2017-11-09 DIAGNOSIS — N186 End stage renal disease: Secondary | ICD-10-CM | POA: Diagnosis not present

## 2017-11-09 DIAGNOSIS — N2581 Secondary hyperparathyroidism of renal origin: Secondary | ICD-10-CM | POA: Diagnosis not present

## 2017-11-09 DIAGNOSIS — D631 Anemia in chronic kidney disease: Secondary | ICD-10-CM | POA: Diagnosis not present

## 2017-11-09 DIAGNOSIS — Z992 Dependence on renal dialysis: Secondary | ICD-10-CM | POA: Diagnosis not present

## 2017-11-09 DIAGNOSIS — D509 Iron deficiency anemia, unspecified: Secondary | ICD-10-CM | POA: Diagnosis not present

## 2017-11-12 DIAGNOSIS — N186 End stage renal disease: Secondary | ICD-10-CM | POA: Diagnosis not present

## 2017-11-12 DIAGNOSIS — D509 Iron deficiency anemia, unspecified: Secondary | ICD-10-CM | POA: Diagnosis not present

## 2017-11-12 DIAGNOSIS — N2581 Secondary hyperparathyroidism of renal origin: Secondary | ICD-10-CM | POA: Diagnosis not present

## 2017-11-12 DIAGNOSIS — D631 Anemia in chronic kidney disease: Secondary | ICD-10-CM | POA: Diagnosis not present

## 2017-11-12 DIAGNOSIS — Z992 Dependence on renal dialysis: Secondary | ICD-10-CM | POA: Diagnosis not present

## 2017-11-14 DIAGNOSIS — N2581 Secondary hyperparathyroidism of renal origin: Secondary | ICD-10-CM | POA: Diagnosis not present

## 2017-11-14 DIAGNOSIS — D631 Anemia in chronic kidney disease: Secondary | ICD-10-CM | POA: Diagnosis not present

## 2017-11-14 DIAGNOSIS — D509 Iron deficiency anemia, unspecified: Secondary | ICD-10-CM | POA: Diagnosis not present

## 2017-11-14 DIAGNOSIS — Z992 Dependence on renal dialysis: Secondary | ICD-10-CM | POA: Diagnosis not present

## 2017-11-14 DIAGNOSIS — N186 End stage renal disease: Secondary | ICD-10-CM | POA: Diagnosis not present

## 2017-11-16 DIAGNOSIS — N186 End stage renal disease: Secondary | ICD-10-CM | POA: Diagnosis not present

## 2017-11-16 DIAGNOSIS — Z992 Dependence on renal dialysis: Secondary | ICD-10-CM | POA: Diagnosis not present

## 2017-11-16 DIAGNOSIS — N2581 Secondary hyperparathyroidism of renal origin: Secondary | ICD-10-CM | POA: Diagnosis not present

## 2017-11-16 DIAGNOSIS — D509 Iron deficiency anemia, unspecified: Secondary | ICD-10-CM | POA: Diagnosis not present

## 2017-11-16 DIAGNOSIS — D631 Anemia in chronic kidney disease: Secondary | ICD-10-CM | POA: Diagnosis not present

## 2017-11-19 DIAGNOSIS — D509 Iron deficiency anemia, unspecified: Secondary | ICD-10-CM | POA: Diagnosis not present

## 2017-11-19 DIAGNOSIS — Z992 Dependence on renal dialysis: Secondary | ICD-10-CM | POA: Diagnosis not present

## 2017-11-19 DIAGNOSIS — N2581 Secondary hyperparathyroidism of renal origin: Secondary | ICD-10-CM | POA: Diagnosis not present

## 2017-11-19 DIAGNOSIS — D631 Anemia in chronic kidney disease: Secondary | ICD-10-CM | POA: Diagnosis not present

## 2017-11-19 DIAGNOSIS — N186 End stage renal disease: Secondary | ICD-10-CM | POA: Diagnosis not present

## 2017-11-20 DIAGNOSIS — H40003 Preglaucoma, unspecified, bilateral: Secondary | ICD-10-CM | POA: Diagnosis not present

## 2017-11-21 DIAGNOSIS — D509 Iron deficiency anemia, unspecified: Secondary | ICD-10-CM | POA: Diagnosis not present

## 2017-11-21 DIAGNOSIS — N186 End stage renal disease: Secondary | ICD-10-CM | POA: Diagnosis not present

## 2017-11-21 DIAGNOSIS — Z992 Dependence on renal dialysis: Secondary | ICD-10-CM | POA: Diagnosis not present

## 2017-11-21 DIAGNOSIS — D631 Anemia in chronic kidney disease: Secondary | ICD-10-CM | POA: Diagnosis not present

## 2017-11-21 DIAGNOSIS — N2581 Secondary hyperparathyroidism of renal origin: Secondary | ICD-10-CM | POA: Diagnosis not present

## 2017-11-23 DIAGNOSIS — D509 Iron deficiency anemia, unspecified: Secondary | ICD-10-CM | POA: Diagnosis not present

## 2017-11-23 DIAGNOSIS — D631 Anemia in chronic kidney disease: Secondary | ICD-10-CM | POA: Diagnosis not present

## 2017-11-23 DIAGNOSIS — N2581 Secondary hyperparathyroidism of renal origin: Secondary | ICD-10-CM | POA: Diagnosis not present

## 2017-11-23 DIAGNOSIS — N186 End stage renal disease: Secondary | ICD-10-CM | POA: Diagnosis not present

## 2017-11-23 DIAGNOSIS — Z992 Dependence on renal dialysis: Secondary | ICD-10-CM | POA: Diagnosis not present

## 2017-11-26 DIAGNOSIS — N2581 Secondary hyperparathyroidism of renal origin: Secondary | ICD-10-CM | POA: Diagnosis not present

## 2017-11-26 DIAGNOSIS — D631 Anemia in chronic kidney disease: Secondary | ICD-10-CM | POA: Diagnosis not present

## 2017-11-26 DIAGNOSIS — Z992 Dependence on renal dialysis: Secondary | ICD-10-CM | POA: Diagnosis not present

## 2017-11-26 DIAGNOSIS — D509 Iron deficiency anemia, unspecified: Secondary | ICD-10-CM | POA: Diagnosis not present

## 2017-11-26 DIAGNOSIS — N186 End stage renal disease: Secondary | ICD-10-CM | POA: Diagnosis not present

## 2017-11-28 DIAGNOSIS — D631 Anemia in chronic kidney disease: Secondary | ICD-10-CM | POA: Diagnosis not present

## 2017-11-28 DIAGNOSIS — Z992 Dependence on renal dialysis: Secondary | ICD-10-CM | POA: Diagnosis not present

## 2017-11-28 DIAGNOSIS — N2581 Secondary hyperparathyroidism of renal origin: Secondary | ICD-10-CM | POA: Diagnosis not present

## 2017-11-28 DIAGNOSIS — N186 End stage renal disease: Secondary | ICD-10-CM | POA: Diagnosis not present

## 2017-11-28 DIAGNOSIS — D509 Iron deficiency anemia, unspecified: Secondary | ICD-10-CM | POA: Diagnosis not present

## 2017-11-30 DIAGNOSIS — D631 Anemia in chronic kidney disease: Secondary | ICD-10-CM | POA: Diagnosis not present

## 2017-11-30 DIAGNOSIS — Z992 Dependence on renal dialysis: Secondary | ICD-10-CM | POA: Diagnosis not present

## 2017-11-30 DIAGNOSIS — N2581 Secondary hyperparathyroidism of renal origin: Secondary | ICD-10-CM | POA: Diagnosis not present

## 2017-11-30 DIAGNOSIS — N186 End stage renal disease: Secondary | ICD-10-CM | POA: Diagnosis not present

## 2017-11-30 DIAGNOSIS — D509 Iron deficiency anemia, unspecified: Secondary | ICD-10-CM | POA: Diagnosis not present

## 2017-12-01 ENCOUNTER — Emergency Department (HOSPITAL_COMMUNITY): Payer: Medicare Other

## 2017-12-01 ENCOUNTER — Encounter (HOSPITAL_COMMUNITY): Payer: Self-pay | Admitting: Emergency Medicine

## 2017-12-01 ENCOUNTER — Emergency Department (HOSPITAL_COMMUNITY)
Admission: EM | Admit: 2017-12-01 | Discharge: 2017-12-01 | Disposition: A | Discharge status: Home / Self Care | Payer: Medicare Other | Attending: Emergency Medicine | Admitting: Emergency Medicine

## 2017-12-01 ENCOUNTER — Other Ambulatory Visit: Payer: Self-pay

## 2017-12-01 DIAGNOSIS — Y929 Unspecified place or not applicable: Secondary | ICD-10-CM | POA: Diagnosis not present

## 2017-12-01 DIAGNOSIS — Z79899 Other long term (current) drug therapy: Secondary | ICD-10-CM | POA: Diagnosis not present

## 2017-12-01 DIAGNOSIS — I120 Hypertensive chronic kidney disease with stage 5 chronic kidney disease or end stage renal disease: Secondary | ICD-10-CM | POA: Insufficient documentation

## 2017-12-01 DIAGNOSIS — R0781 Pleurodynia: Secondary | ICD-10-CM | POA: Diagnosis not present

## 2017-12-01 DIAGNOSIS — N183 Chronic kidney disease, stage 3 (moderate): Secondary | ICD-10-CM | POA: Diagnosis not present

## 2017-12-01 DIAGNOSIS — R0789 Other chest pain: Secondary | ICD-10-CM | POA: Diagnosis present

## 2017-12-01 DIAGNOSIS — J449 Chronic obstructive pulmonary disease, unspecified: Secondary | ICD-10-CM | POA: Insufficient documentation

## 2017-12-01 DIAGNOSIS — Z992 Dependence on renal dialysis: Secondary | ICD-10-CM | POA: Insufficient documentation

## 2017-12-01 DIAGNOSIS — N186 End stage renal disease: Secondary | ICD-10-CM | POA: Diagnosis not present

## 2017-12-01 DIAGNOSIS — S20211A Contusion of right front wall of thorax, initial encounter: Secondary | ICD-10-CM | POA: Diagnosis not present

## 2017-12-01 DIAGNOSIS — F17210 Nicotine dependence, cigarettes, uncomplicated: Secondary | ICD-10-CM | POA: Insufficient documentation

## 2017-12-01 DIAGNOSIS — S299XXA Unspecified injury of thorax, initial encounter: Secondary | ICD-10-CM | POA: Diagnosis not present

## 2017-12-01 DIAGNOSIS — Y939 Activity, unspecified: Secondary | ICD-10-CM | POA: Insufficient documentation

## 2017-12-01 DIAGNOSIS — X58XXXA Exposure to other specified factors, initial encounter: Secondary | ICD-10-CM | POA: Diagnosis not present

## 2017-12-01 DIAGNOSIS — Y999 Unspecified external cause status: Secondary | ICD-10-CM | POA: Diagnosis not present

## 2017-12-01 DIAGNOSIS — Z7902 Long term (current) use of antithrombotics/antiplatelets: Secondary | ICD-10-CM | POA: Diagnosis not present

## 2017-12-01 HISTORY — DX: Dependence on renal dialysis: Z99.2

## 2017-12-01 MED ORDER — HYDROCODONE-ACETAMINOPHEN 5-325 MG PO TABS: 1.0000 | ORAL_TABLET | ORAL | 0 refills | Status: DC | PRN

## 2017-12-01 NOTE — Discharge Instructions (Addendum)
Your xrays are negative for a fracture today, but a contusion can be almost as painful. The good news is this should heal quickly.  You may apply a heating pad for 15 minutes 4 times daily to the site.  Wear your splint as are doing, but not constantly as it is important to take deep breaths to avoid developing pneumonia. Get rechecked  by your doctor if not improving over the next week.  Also get rechecked for any shortness of breath or fevers.

## 2017-12-01 NOTE — ED Notes (Signed)
Restricted extremity band placed on RT arm. 

## 2017-12-01 NOTE — ED Triage Notes (Signed)
Pt c/o pain to RT ribcage since Thursday after leaning over a car door frame.
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
## 2017-12-03 DIAGNOSIS — D631 Anemia in chronic kidney disease: Secondary | ICD-10-CM | POA: Diagnosis not present

## 2017-12-03 DIAGNOSIS — Z992 Dependence on renal dialysis: Secondary | ICD-10-CM | POA: Diagnosis not present

## 2017-12-03 DIAGNOSIS — N2581 Secondary hyperparathyroidism of renal origin: Secondary | ICD-10-CM | POA: Diagnosis not present

## 2017-12-03 DIAGNOSIS — N186 End stage renal disease: Secondary | ICD-10-CM | POA: Diagnosis not present

## 2017-12-03 DIAGNOSIS — D509 Iron deficiency anemia, unspecified: Secondary | ICD-10-CM | POA: Diagnosis not present

## 2017-12-05 DIAGNOSIS — Z992 Dependence on renal dialysis: Secondary | ICD-10-CM | POA: Diagnosis not present

## 2017-12-05 DIAGNOSIS — N186 End stage renal disease: Secondary | ICD-10-CM | POA: Diagnosis not present

## 2017-12-05 DIAGNOSIS — D509 Iron deficiency anemia, unspecified: Secondary | ICD-10-CM | POA: Diagnosis not present

## 2017-12-05 DIAGNOSIS — N2581 Secondary hyperparathyroidism of renal origin: Secondary | ICD-10-CM | POA: Diagnosis not present

## 2017-12-05 DIAGNOSIS — D631 Anemia in chronic kidney disease: Secondary | ICD-10-CM | POA: Diagnosis not present

## 2017-12-07 DIAGNOSIS — Z992 Dependence on renal dialysis: Secondary | ICD-10-CM | POA: Diagnosis not present

## 2017-12-07 DIAGNOSIS — N186 End stage renal disease: Secondary | ICD-10-CM | POA: Diagnosis not present

## 2017-12-07 DIAGNOSIS — D631 Anemia in chronic kidney disease: Secondary | ICD-10-CM | POA: Diagnosis not present

## 2017-12-07 DIAGNOSIS — D509 Iron deficiency anemia, unspecified: Secondary | ICD-10-CM | POA: Diagnosis not present

## 2017-12-07 DIAGNOSIS — N2581 Secondary hyperparathyroidism of renal origin: Secondary | ICD-10-CM | POA: Diagnosis not present

## 2017-12-10 DIAGNOSIS — N2581 Secondary hyperparathyroidism of renal origin: Secondary | ICD-10-CM | POA: Diagnosis not present

## 2017-12-10 DIAGNOSIS — D509 Iron deficiency anemia, unspecified: Secondary | ICD-10-CM | POA: Diagnosis not present

## 2017-12-10 DIAGNOSIS — N186 End stage renal disease: Secondary | ICD-10-CM | POA: Diagnosis not present

## 2017-12-10 DIAGNOSIS — Z992 Dependence on renal dialysis: Secondary | ICD-10-CM | POA: Diagnosis not present

## 2017-12-12 ENCOUNTER — Other Ambulatory Visit: Payer: Self-pay

## 2017-12-12 ENCOUNTER — Encounter (HOSPITAL_COMMUNITY): Payer: Self-pay | Admitting: Emergency Medicine

## 2017-12-12 ENCOUNTER — Emergency Department (HOSPITAL_COMMUNITY)
Admission: EM | Admit: 2017-12-12 | Discharge: 2017-12-12 | Disposition: A | Discharge status: Home / Self Care | Payer: Medicare Other | Attending: Emergency Medicine | Admitting: Emergency Medicine

## 2017-12-12 DIAGNOSIS — D509 Iron deficiency anemia, unspecified: Secondary | ICD-10-CM | POA: Diagnosis not present

## 2017-12-12 DIAGNOSIS — Z79899 Other long term (current) drug therapy: Secondary | ICD-10-CM | POA: Insufficient documentation

## 2017-12-12 DIAGNOSIS — Z7902 Long term (current) use of antithrombotics/antiplatelets: Secondary | ICD-10-CM | POA: Insufficient documentation

## 2017-12-12 DIAGNOSIS — N186 End stage renal disease: Secondary | ICD-10-CM | POA: Insufficient documentation

## 2017-12-12 DIAGNOSIS — J449 Chronic obstructive pulmonary disease, unspecified: Secondary | ICD-10-CM | POA: Diagnosis not present

## 2017-12-12 DIAGNOSIS — I120 Hypertensive chronic kidney disease with stage 5 chronic kidney disease or end stage renal disease: Secondary | ICD-10-CM | POA: Diagnosis not present

## 2017-12-12 DIAGNOSIS — N2581 Secondary hyperparathyroidism of renal origin: Secondary | ICD-10-CM | POA: Diagnosis not present

## 2017-12-12 DIAGNOSIS — F17210 Nicotine dependence, cigarettes, uncomplicated: Secondary | ICD-10-CM | POA: Diagnosis not present

## 2017-12-12 DIAGNOSIS — Z760 Encounter for issue of repeat prescription: Secondary | ICD-10-CM

## 2017-12-12 DIAGNOSIS — Z992 Dependence on renal dialysis: Secondary | ICD-10-CM | POA: Diagnosis not present

## 2017-12-12 NOTE — ED Triage Notes (Signed)
Pt her a couple weeks ago with right rib pain. Ran out of his hydrocodones and requesting more.

## 2017-12-12 NOTE — Discharge Instructions (Addendum)
Your oxygen level is 99% on room air.  Your examination does not show evidence of changes in your breathing, or other signs of respiratory emergencies.  The emergency department is not set up for medication refills.  Please discuss pain management with your primary care physician, as well as your nephrology specialist.

## 2017-12-12 NOTE — ED Notes (Signed)
Patient here requesting a refill of hydrocodone.
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
## 2017-12-14 DIAGNOSIS — N2581 Secondary hyperparathyroidism of renal origin: Secondary | ICD-10-CM | POA: Diagnosis not present

## 2017-12-14 DIAGNOSIS — Z992 Dependence on renal dialysis: Secondary | ICD-10-CM | POA: Diagnosis not present

## 2017-12-14 DIAGNOSIS — D509 Iron deficiency anemia, unspecified: Secondary | ICD-10-CM | POA: Diagnosis not present

## 2017-12-14 DIAGNOSIS — N186 End stage renal disease: Secondary | ICD-10-CM | POA: Diagnosis not present

## 2017-12-17 DIAGNOSIS — N2581 Secondary hyperparathyroidism of renal origin: Secondary | ICD-10-CM | POA: Diagnosis not present

## 2017-12-17 DIAGNOSIS — D509 Iron deficiency anemia, unspecified: Secondary | ICD-10-CM | POA: Diagnosis not present

## 2017-12-17 DIAGNOSIS — Z992 Dependence on renal dialysis: Secondary | ICD-10-CM | POA: Diagnosis not present

## 2017-12-17 DIAGNOSIS — N186 End stage renal disease: Secondary | ICD-10-CM | POA: Diagnosis not present

## 2017-12-21 DIAGNOSIS — D509 Iron deficiency anemia, unspecified: Secondary | ICD-10-CM | POA: Diagnosis not present

## 2017-12-21 DIAGNOSIS — N2581 Secondary hyperparathyroidism of renal origin: Secondary | ICD-10-CM | POA: Diagnosis not present

## 2017-12-21 DIAGNOSIS — N186 End stage renal disease: Secondary | ICD-10-CM | POA: Diagnosis not present

## 2017-12-21 DIAGNOSIS — Z992 Dependence on renal dialysis: Secondary | ICD-10-CM | POA: Diagnosis not present

## 2017-12-24 DIAGNOSIS — N186 End stage renal disease: Secondary | ICD-10-CM | POA: Diagnosis not present

## 2017-12-24 DIAGNOSIS — D509 Iron deficiency anemia, unspecified: Secondary | ICD-10-CM | POA: Diagnosis not present

## 2017-12-24 DIAGNOSIS — N2581 Secondary hyperparathyroidism of renal origin: Secondary | ICD-10-CM | POA: Diagnosis not present

## 2017-12-24 DIAGNOSIS — Z992 Dependence on renal dialysis: Secondary | ICD-10-CM | POA: Diagnosis not present

## 2017-12-26 DIAGNOSIS — Z992 Dependence on renal dialysis: Secondary | ICD-10-CM | POA: Diagnosis not present

## 2017-12-26 DIAGNOSIS — N186 End stage renal disease: Secondary | ICD-10-CM | POA: Diagnosis not present

## 2017-12-26 DIAGNOSIS — N2581 Secondary hyperparathyroidism of renal origin: Secondary | ICD-10-CM | POA: Diagnosis not present

## 2017-12-26 DIAGNOSIS — D509 Iron deficiency anemia, unspecified: Secondary | ICD-10-CM | POA: Diagnosis not present

## 2017-12-27 DIAGNOSIS — F17210 Nicotine dependence, cigarettes, uncomplicated: Secondary | ICD-10-CM | POA: Diagnosis not present

## 2017-12-27 DIAGNOSIS — M539 Dorsopathy, unspecified: Secondary | ICD-10-CM | POA: Diagnosis not present

## 2017-12-27 DIAGNOSIS — I10 Essential (primary) hypertension: Secondary | ICD-10-CM | POA: Diagnosis not present

## 2017-12-27 DIAGNOSIS — N186 End stage renal disease: Secondary | ICD-10-CM | POA: Diagnosis not present

## 2017-12-28 DIAGNOSIS — N2581 Secondary hyperparathyroidism of renal origin: Secondary | ICD-10-CM | POA: Diagnosis not present

## 2017-12-28 DIAGNOSIS — Z992 Dependence on renal dialysis: Secondary | ICD-10-CM | POA: Diagnosis not present

## 2017-12-28 DIAGNOSIS — N186 End stage renal disease: Secondary | ICD-10-CM | POA: Diagnosis not present

## 2017-12-28 DIAGNOSIS — D509 Iron deficiency anemia, unspecified: Secondary | ICD-10-CM | POA: Diagnosis not present

## 2017-12-31 DIAGNOSIS — N2581 Secondary hyperparathyroidism of renal origin: Secondary | ICD-10-CM | POA: Diagnosis not present

## 2017-12-31 DIAGNOSIS — Z992 Dependence on renal dialysis: Secondary | ICD-10-CM | POA: Diagnosis not present

## 2017-12-31 DIAGNOSIS — D509 Iron deficiency anemia, unspecified: Secondary | ICD-10-CM | POA: Diagnosis not present

## 2017-12-31 DIAGNOSIS — N186 End stage renal disease: Secondary | ICD-10-CM | POA: Diagnosis not present

## 2018-01-02 DIAGNOSIS — N2581 Secondary hyperparathyroidism of renal origin: Secondary | ICD-10-CM | POA: Diagnosis not present

## 2018-01-02 DIAGNOSIS — N186 End stage renal disease: Secondary | ICD-10-CM | POA: Diagnosis not present

## 2018-01-02 DIAGNOSIS — Z992 Dependence on renal dialysis: Secondary | ICD-10-CM | POA: Diagnosis not present

## 2018-01-02 DIAGNOSIS — D509 Iron deficiency anemia, unspecified: Secondary | ICD-10-CM | POA: Diagnosis not present

## 2018-01-04 DIAGNOSIS — Z992 Dependence on renal dialysis: Secondary | ICD-10-CM | POA: Diagnosis not present

## 2018-01-04 DIAGNOSIS — N2581 Secondary hyperparathyroidism of renal origin: Secondary | ICD-10-CM | POA: Diagnosis not present

## 2018-01-04 DIAGNOSIS — N186 End stage renal disease: Secondary | ICD-10-CM | POA: Diagnosis not present

## 2018-01-04 DIAGNOSIS — D509 Iron deficiency anemia, unspecified: Secondary | ICD-10-CM | POA: Diagnosis not present

## 2018-01-06 DIAGNOSIS — N186 End stage renal disease: Secondary | ICD-10-CM | POA: Diagnosis not present

## 2018-01-06 DIAGNOSIS — Z992 Dependence on renal dialysis: Secondary | ICD-10-CM | POA: Diagnosis not present

## 2018-01-07 DIAGNOSIS — Z992 Dependence on renal dialysis: Secondary | ICD-10-CM | POA: Diagnosis not present

## 2018-01-07 DIAGNOSIS — D631 Anemia in chronic kidney disease: Secondary | ICD-10-CM | POA: Diagnosis not present

## 2018-01-07 DIAGNOSIS — N186 End stage renal disease: Secondary | ICD-10-CM | POA: Diagnosis not present

## 2018-01-07 DIAGNOSIS — D509 Iron deficiency anemia, unspecified: Secondary | ICD-10-CM | POA: Diagnosis not present

## 2018-01-07 DIAGNOSIS — N2581 Secondary hyperparathyroidism of renal origin: Secondary | ICD-10-CM | POA: Diagnosis not present

## 2018-01-09 DIAGNOSIS — D509 Iron deficiency anemia, unspecified: Secondary | ICD-10-CM | POA: Diagnosis not present

## 2018-01-09 DIAGNOSIS — N186 End stage renal disease: Secondary | ICD-10-CM | POA: Diagnosis not present

## 2018-01-09 DIAGNOSIS — N2581 Secondary hyperparathyroidism of renal origin: Secondary | ICD-10-CM | POA: Diagnosis not present

## 2018-01-09 DIAGNOSIS — D631 Anemia in chronic kidney disease: Secondary | ICD-10-CM | POA: Diagnosis not present

## 2018-01-09 DIAGNOSIS — Z992 Dependence on renal dialysis: Secondary | ICD-10-CM | POA: Diagnosis not present

## 2018-01-11 DIAGNOSIS — N186 End stage renal disease: Secondary | ICD-10-CM | POA: Diagnosis not present

## 2018-01-11 DIAGNOSIS — D631 Anemia in chronic kidney disease: Secondary | ICD-10-CM | POA: Diagnosis not present

## 2018-01-11 DIAGNOSIS — Z992 Dependence on renal dialysis: Secondary | ICD-10-CM | POA: Diagnosis not present

## 2018-01-11 DIAGNOSIS — N2581 Secondary hyperparathyroidism of renal origin: Secondary | ICD-10-CM | POA: Diagnosis not present

## 2018-01-11 DIAGNOSIS — D509 Iron deficiency anemia, unspecified: Secondary | ICD-10-CM | POA: Diagnosis not present

## 2018-01-14 DIAGNOSIS — N186 End stage renal disease: Secondary | ICD-10-CM | POA: Diagnosis not present

## 2018-01-14 DIAGNOSIS — D509 Iron deficiency anemia, unspecified: Secondary | ICD-10-CM | POA: Diagnosis not present

## 2018-01-14 DIAGNOSIS — N2581 Secondary hyperparathyroidism of renal origin: Secondary | ICD-10-CM | POA: Diagnosis not present

## 2018-01-14 DIAGNOSIS — D631 Anemia in chronic kidney disease: Secondary | ICD-10-CM | POA: Diagnosis not present

## 2018-01-14 DIAGNOSIS — Z992 Dependence on renal dialysis: Secondary | ICD-10-CM | POA: Diagnosis not present

## 2018-01-16 DIAGNOSIS — D509 Iron deficiency anemia, unspecified: Secondary | ICD-10-CM | POA: Diagnosis not present

## 2018-01-16 DIAGNOSIS — N186 End stage renal disease: Secondary | ICD-10-CM | POA: Diagnosis not present

## 2018-01-16 DIAGNOSIS — D631 Anemia in chronic kidney disease: Secondary | ICD-10-CM | POA: Diagnosis not present

## 2018-01-16 DIAGNOSIS — N2581 Secondary hyperparathyroidism of renal origin: Secondary | ICD-10-CM | POA: Diagnosis not present

## 2018-01-16 DIAGNOSIS — Z992 Dependence on renal dialysis: Secondary | ICD-10-CM | POA: Diagnosis not present

## 2018-01-21 DIAGNOSIS — D509 Iron deficiency anemia, unspecified: Secondary | ICD-10-CM | POA: Diagnosis not present

## 2018-01-21 DIAGNOSIS — N2581 Secondary hyperparathyroidism of renal origin: Secondary | ICD-10-CM | POA: Diagnosis not present

## 2018-01-21 DIAGNOSIS — N186 End stage renal disease: Secondary | ICD-10-CM | POA: Diagnosis not present

## 2018-01-21 DIAGNOSIS — D631 Anemia in chronic kidney disease: Secondary | ICD-10-CM | POA: Diagnosis not present

## 2018-01-21 DIAGNOSIS — Z992 Dependence on renal dialysis: Secondary | ICD-10-CM | POA: Diagnosis not present

## 2018-01-23 DIAGNOSIS — D509 Iron deficiency anemia, unspecified: Secondary | ICD-10-CM | POA: Diagnosis not present

## 2018-01-23 DIAGNOSIS — N2581 Secondary hyperparathyroidism of renal origin: Secondary | ICD-10-CM | POA: Diagnosis not present

## 2018-01-23 DIAGNOSIS — D631 Anemia in chronic kidney disease: Secondary | ICD-10-CM | POA: Diagnosis not present

## 2018-01-23 DIAGNOSIS — N186 End stage renal disease: Secondary | ICD-10-CM | POA: Diagnosis not present

## 2018-01-23 DIAGNOSIS — Z992 Dependence on renal dialysis: Secondary | ICD-10-CM | POA: Diagnosis not present

## 2018-01-25 DIAGNOSIS — N186 End stage renal disease: Secondary | ICD-10-CM | POA: Diagnosis not present

## 2018-01-25 DIAGNOSIS — N2581 Secondary hyperparathyroidism of renal origin: Secondary | ICD-10-CM | POA: Diagnosis not present

## 2018-01-25 DIAGNOSIS — Z992 Dependence on renal dialysis: Secondary | ICD-10-CM | POA: Diagnosis not present

## 2018-01-25 DIAGNOSIS — D509 Iron deficiency anemia, unspecified: Secondary | ICD-10-CM | POA: Diagnosis not present

## 2018-01-25 DIAGNOSIS — D631 Anemia in chronic kidney disease: Secondary | ICD-10-CM | POA: Diagnosis not present

## 2018-01-28 DIAGNOSIS — D631 Anemia in chronic kidney disease: Secondary | ICD-10-CM | POA: Diagnosis not present

## 2018-01-28 DIAGNOSIS — D509 Iron deficiency anemia, unspecified: Secondary | ICD-10-CM | POA: Diagnosis not present

## 2018-01-28 DIAGNOSIS — N186 End stage renal disease: Secondary | ICD-10-CM | POA: Diagnosis not present

## 2018-01-28 DIAGNOSIS — N2581 Secondary hyperparathyroidism of renal origin: Secondary | ICD-10-CM | POA: Diagnosis not present

## 2018-01-28 DIAGNOSIS — Z992 Dependence on renal dialysis: Secondary | ICD-10-CM | POA: Diagnosis not present

## 2018-01-30 DIAGNOSIS — N2581 Secondary hyperparathyroidism of renal origin: Secondary | ICD-10-CM | POA: Diagnosis not present

## 2018-01-30 DIAGNOSIS — Z992 Dependence on renal dialysis: Secondary | ICD-10-CM | POA: Diagnosis not present

## 2018-01-30 DIAGNOSIS — D509 Iron deficiency anemia, unspecified: Secondary | ICD-10-CM | POA: Diagnosis not present

## 2018-01-30 DIAGNOSIS — D631 Anemia in chronic kidney disease: Secondary | ICD-10-CM | POA: Diagnosis not present

## 2018-01-30 DIAGNOSIS — N186 End stage renal disease: Secondary | ICD-10-CM | POA: Diagnosis not present

## 2018-02-01 DIAGNOSIS — D509 Iron deficiency anemia, unspecified: Secondary | ICD-10-CM | POA: Diagnosis not present

## 2018-02-01 DIAGNOSIS — Z992 Dependence on renal dialysis: Secondary | ICD-10-CM | POA: Diagnosis not present

## 2018-02-01 DIAGNOSIS — N2581 Secondary hyperparathyroidism of renal origin: Secondary | ICD-10-CM | POA: Diagnosis not present

## 2018-02-01 DIAGNOSIS — N186 End stage renal disease: Secondary | ICD-10-CM | POA: Diagnosis not present

## 2018-02-01 DIAGNOSIS — D631 Anemia in chronic kidney disease: Secondary | ICD-10-CM | POA: Diagnosis not present

## 2018-02-04 DIAGNOSIS — D509 Iron deficiency anemia, unspecified: Secondary | ICD-10-CM | POA: Diagnosis not present

## 2018-02-04 DIAGNOSIS — D631 Anemia in chronic kidney disease: Secondary | ICD-10-CM | POA: Diagnosis not present

## 2018-02-04 DIAGNOSIS — N2581 Secondary hyperparathyroidism of renal origin: Secondary | ICD-10-CM | POA: Diagnosis not present

## 2018-02-04 DIAGNOSIS — N186 End stage renal disease: Secondary | ICD-10-CM | POA: Diagnosis not present

## 2018-02-04 DIAGNOSIS — Z992 Dependence on renal dialysis: Secondary | ICD-10-CM | POA: Diagnosis not present

## 2018-02-06 DIAGNOSIS — Z992 Dependence on renal dialysis: Secondary | ICD-10-CM | POA: Diagnosis not present

## 2018-02-06 DIAGNOSIS — N2581 Secondary hyperparathyroidism of renal origin: Secondary | ICD-10-CM | POA: Diagnosis not present

## 2018-02-06 DIAGNOSIS — N186 End stage renal disease: Secondary | ICD-10-CM | POA: Diagnosis not present

## 2018-02-06 DIAGNOSIS — D509 Iron deficiency anemia, unspecified: Secondary | ICD-10-CM | POA: Diagnosis not present

## 2018-02-06 DIAGNOSIS — D631 Anemia in chronic kidney disease: Secondary | ICD-10-CM | POA: Diagnosis not present

## 2018-02-08 ENCOUNTER — Emergency Department (HOSPITAL_COMMUNITY)
Admission: EM | Admit: 2018-02-08 | Discharge: 2018-02-08 | Disposition: A | Discharge status: Home / Self Care | Payer: Medicare Other | Attending: Emergency Medicine | Admitting: Emergency Medicine

## 2018-02-08 ENCOUNTER — Other Ambulatory Visit: Payer: Self-pay

## 2018-02-08 ENCOUNTER — Encounter (HOSPITAL_COMMUNITY): Payer: Self-pay | Admitting: Emergency Medicine

## 2018-02-08 ENCOUNTER — Emergency Department (HOSPITAL_COMMUNITY): Payer: Medicare Other

## 2018-02-08 DIAGNOSIS — N186 End stage renal disease: Secondary | ICD-10-CM | POA: Diagnosis not present

## 2018-02-08 DIAGNOSIS — M25571 Pain in right ankle and joints of right foot: Secondary | ICD-10-CM | POA: Diagnosis not present

## 2018-02-08 DIAGNOSIS — W208XXA Other cause of strike by thrown, projected or falling object, initial encounter: Secondary | ICD-10-CM | POA: Diagnosis not present

## 2018-02-08 DIAGNOSIS — Y9289 Other specified places as the place of occurrence of the external cause: Secondary | ICD-10-CM | POA: Diagnosis not present

## 2018-02-08 DIAGNOSIS — M7989 Other specified soft tissue disorders: Secondary | ICD-10-CM | POA: Diagnosis not present

## 2018-02-08 DIAGNOSIS — S8991XA Unspecified injury of right lower leg, initial encounter: Secondary | ICD-10-CM | POA: Diagnosis present

## 2018-02-08 DIAGNOSIS — S90121A Contusion of right lesser toe(s) without damage to nail, initial encounter: Secondary | ICD-10-CM | POA: Diagnosis not present

## 2018-02-08 DIAGNOSIS — F17210 Nicotine dependence, cigarettes, uncomplicated: Secondary | ICD-10-CM | POA: Diagnosis not present

## 2018-02-08 DIAGNOSIS — Z992 Dependence on renal dialysis: Secondary | ICD-10-CM | POA: Insufficient documentation

## 2018-02-08 DIAGNOSIS — Y990 Civilian activity done for income or pay: Secondary | ICD-10-CM | POA: Diagnosis not present

## 2018-02-08 DIAGNOSIS — J449 Chronic obstructive pulmonary disease, unspecified: Secondary | ICD-10-CM | POA: Insufficient documentation

## 2018-02-08 DIAGNOSIS — I120 Hypertensive chronic kidney disease with stage 5 chronic kidney disease or end stage renal disease: Secondary | ICD-10-CM | POA: Insufficient documentation

## 2018-02-08 DIAGNOSIS — Z7902 Long term (current) use of antithrombotics/antiplatelets: Secondary | ICD-10-CM | POA: Insufficient documentation

## 2018-02-08 DIAGNOSIS — Z79899 Other long term (current) drug therapy: Secondary | ICD-10-CM | POA: Insufficient documentation

## 2018-02-08 DIAGNOSIS — Y9389 Activity, other specified: Secondary | ICD-10-CM | POA: Insufficient documentation

## 2018-02-08 DIAGNOSIS — S99921A Unspecified injury of right foot, initial encounter: Secondary | ICD-10-CM | POA: Diagnosis not present

## 2018-02-08 DIAGNOSIS — M79671 Pain in right foot: Secondary | ICD-10-CM | POA: Diagnosis not present

## 2018-02-08 MED ORDER — DICLOFENAC SODIUM 50 MG PO TBEC: 50.0000 mg | DELAYED_RELEASE_TABLET | Freq: Two times a day (BID) | ORAL | 0 refills | Status: DC

## 2018-02-08 NOTE — ED Triage Notes (Signed)
Onset last night, pt was moving a refrigerator and drop it on his leg. Complaining of right ankle pain, pt has it wrapped.

## 2018-02-08 NOTE — Discharge Instructions (Addendum)
Return if any problems.

## 2018-02-08 NOTE — ED Notes (Signed)
Pt reports dropped refrigerator to his foot yesterday   Today difficult to walk on it  Took tramadol without relief
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
## 2018-02-11 DIAGNOSIS — N2581 Secondary hyperparathyroidism of renal origin: Secondary | ICD-10-CM | POA: Diagnosis not present

## 2018-02-11 DIAGNOSIS — N186 End stage renal disease: Secondary | ICD-10-CM | POA: Diagnosis not present

## 2018-02-11 DIAGNOSIS — D631 Anemia in chronic kidney disease: Secondary | ICD-10-CM | POA: Diagnosis not present

## 2018-02-11 DIAGNOSIS — Z992 Dependence on renal dialysis: Secondary | ICD-10-CM | POA: Diagnosis not present

## 2018-02-11 DIAGNOSIS — D509 Iron deficiency anemia, unspecified: Secondary | ICD-10-CM | POA: Diagnosis not present

## 2018-02-13 DIAGNOSIS — N2581 Secondary hyperparathyroidism of renal origin: Secondary | ICD-10-CM | POA: Diagnosis not present

## 2018-02-13 DIAGNOSIS — N186 End stage renal disease: Secondary | ICD-10-CM | POA: Diagnosis not present

## 2018-02-13 DIAGNOSIS — D509 Iron deficiency anemia, unspecified: Secondary | ICD-10-CM | POA: Diagnosis not present

## 2018-02-13 DIAGNOSIS — Z992 Dependence on renal dialysis: Secondary | ICD-10-CM | POA: Diagnosis not present

## 2018-02-13 DIAGNOSIS — D631 Anemia in chronic kidney disease: Secondary | ICD-10-CM | POA: Diagnosis not present

## 2018-02-15 DIAGNOSIS — D631 Anemia in chronic kidney disease: Secondary | ICD-10-CM | POA: Diagnosis not present

## 2018-02-15 DIAGNOSIS — N2581 Secondary hyperparathyroidism of renal origin: Secondary | ICD-10-CM | POA: Diagnosis not present

## 2018-02-15 DIAGNOSIS — N186 End stage renal disease: Secondary | ICD-10-CM | POA: Diagnosis not present

## 2018-02-15 DIAGNOSIS — Z992 Dependence on renal dialysis: Secondary | ICD-10-CM | POA: Diagnosis not present

## 2018-02-15 DIAGNOSIS — D509 Iron deficiency anemia, unspecified: Secondary | ICD-10-CM | POA: Diagnosis not present

## 2018-02-18 DIAGNOSIS — D509 Iron deficiency anemia, unspecified: Secondary | ICD-10-CM | POA: Diagnosis not present

## 2018-02-18 DIAGNOSIS — N186 End stage renal disease: Secondary | ICD-10-CM | POA: Diagnosis not present

## 2018-02-18 DIAGNOSIS — N2581 Secondary hyperparathyroidism of renal origin: Secondary | ICD-10-CM | POA: Diagnosis not present

## 2018-02-18 DIAGNOSIS — Z992 Dependence on renal dialysis: Secondary | ICD-10-CM | POA: Diagnosis not present

## 2018-02-18 DIAGNOSIS — D631 Anemia in chronic kidney disease: Secondary | ICD-10-CM | POA: Diagnosis not present

## 2018-02-20 DIAGNOSIS — N2581 Secondary hyperparathyroidism of renal origin: Secondary | ICD-10-CM | POA: Diagnosis not present

## 2018-02-20 DIAGNOSIS — D509 Iron deficiency anemia, unspecified: Secondary | ICD-10-CM | POA: Diagnosis not present

## 2018-02-20 DIAGNOSIS — N186 End stage renal disease: Secondary | ICD-10-CM | POA: Diagnosis not present

## 2018-02-20 DIAGNOSIS — D631 Anemia in chronic kidney disease: Secondary | ICD-10-CM | POA: Diagnosis not present

## 2018-02-20 DIAGNOSIS — Z992 Dependence on renal dialysis: Secondary | ICD-10-CM | POA: Diagnosis not present

## 2018-02-22 DIAGNOSIS — D631 Anemia in chronic kidney disease: Secondary | ICD-10-CM | POA: Diagnosis not present

## 2018-02-22 DIAGNOSIS — N186 End stage renal disease: Secondary | ICD-10-CM | POA: Diagnosis not present

## 2018-02-22 DIAGNOSIS — N2581 Secondary hyperparathyroidism of renal origin: Secondary | ICD-10-CM | POA: Diagnosis not present

## 2018-02-22 DIAGNOSIS — D509 Iron deficiency anemia, unspecified: Secondary | ICD-10-CM | POA: Diagnosis not present

## 2018-02-22 DIAGNOSIS — Z992 Dependence on renal dialysis: Secondary | ICD-10-CM | POA: Diagnosis not present

## 2018-02-26 ENCOUNTER — Other Ambulatory Visit: Payer: Self-pay

## 2018-02-26 ENCOUNTER — Emergency Department (HOSPITAL_COMMUNITY): Payer: Medicare Other

## 2018-02-26 ENCOUNTER — Encounter (HOSPITAL_COMMUNITY): Payer: Self-pay

## 2018-02-26 ENCOUNTER — Emergency Department (HOSPITAL_COMMUNITY)
Admission: EM | Admit: 2018-02-26 | Discharge: 2018-02-26 | Disposition: A | Discharge status: Home / Self Care | Payer: Medicare Other | Attending: Emergency Medicine | Admitting: Emergency Medicine

## 2018-02-26 DIAGNOSIS — R911 Solitary pulmonary nodule: Secondary | ICD-10-CM | POA: Diagnosis not present

## 2018-02-26 DIAGNOSIS — J449 Chronic obstructive pulmonary disease, unspecified: Secondary | ICD-10-CM | POA: Diagnosis not present

## 2018-02-26 DIAGNOSIS — Z79899 Other long term (current) drug therapy: Secondary | ICD-10-CM | POA: Insufficient documentation

## 2018-02-26 DIAGNOSIS — N186 End stage renal disease: Secondary | ICD-10-CM | POA: Insufficient documentation

## 2018-02-26 DIAGNOSIS — Z992 Dependence on renal dialysis: Secondary | ICD-10-CM | POA: Diagnosis not present

## 2018-02-26 DIAGNOSIS — M542 Cervicalgia: Secondary | ICD-10-CM | POA: Diagnosis not present

## 2018-02-26 DIAGNOSIS — I120 Hypertensive chronic kidney disease with stage 5 chronic kidney disease or end stage renal disease: Secondary | ICD-10-CM | POA: Insufficient documentation

## 2018-02-26 DIAGNOSIS — F17210 Nicotine dependence, cigarettes, uncomplicated: Secondary | ICD-10-CM | POA: Diagnosis not present

## 2018-02-26 MED ORDER — HYDROMORPHONE HCL 1 MG/ML IJ SOLN
1.0000 mg | Freq: Once | INTRAMUSCULAR | Status: AC
  Administered 2018-02-26: 1 mg via INTRAMUSCULAR
  Filled 2018-02-26: qty 1

## 2018-02-26 MED ORDER — CYCLOBENZAPRINE HCL 5 MG PO TABS: 5.0000 mg | ORAL_TABLET | Freq: Three times a day (TID) | ORAL | 0 refills | Status: DC | PRN

## 2018-02-26 MED ORDER — OXYCODONE-ACETAMINOPHEN 5-325 MG PO TABS: 1.0000 | ORAL_TABLET | ORAL | 0 refills | Status: DC | PRN
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
## 2018-02-26 NOTE — Discharge Instructions (Addendum)
X-ray shows a previous fusion of your neck bones.  Also you have a small nodule in your left lung.  Will need a CT scan of your lung in 6 to 12 months.  Show this to your primary care doctor.
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
## 2018-02-27 DIAGNOSIS — N2581 Secondary hyperparathyroidism of renal origin: Secondary | ICD-10-CM | POA: Diagnosis not present

## 2018-02-27 DIAGNOSIS — Z992 Dependence on renal dialysis: Secondary | ICD-10-CM | POA: Diagnosis not present

## 2018-02-27 DIAGNOSIS — D509 Iron deficiency anemia, unspecified: Secondary | ICD-10-CM | POA: Diagnosis not present

## 2018-02-27 DIAGNOSIS — N186 End stage renal disease: Secondary | ICD-10-CM | POA: Diagnosis not present

## 2018-02-27 DIAGNOSIS — D631 Anemia in chronic kidney disease: Secondary | ICD-10-CM | POA: Diagnosis not present

## 2018-03-01 DIAGNOSIS — D631 Anemia in chronic kidney disease: Secondary | ICD-10-CM | POA: Diagnosis not present

## 2018-03-01 DIAGNOSIS — N2581 Secondary hyperparathyroidism of renal origin: Secondary | ICD-10-CM | POA: Diagnosis not present

## 2018-03-01 DIAGNOSIS — Z992 Dependence on renal dialysis: Secondary | ICD-10-CM | POA: Diagnosis not present

## 2018-03-01 DIAGNOSIS — N186 End stage renal disease: Secondary | ICD-10-CM | POA: Diagnosis not present

## 2018-03-01 DIAGNOSIS — D509 Iron deficiency anemia, unspecified: Secondary | ICD-10-CM | POA: Diagnosis not present

## 2018-03-04 DIAGNOSIS — D509 Iron deficiency anemia, unspecified: Secondary | ICD-10-CM | POA: Diagnosis not present

## 2018-03-04 DIAGNOSIS — D631 Anemia in chronic kidney disease: Secondary | ICD-10-CM | POA: Diagnosis not present

## 2018-03-04 DIAGNOSIS — N2581 Secondary hyperparathyroidism of renal origin: Secondary | ICD-10-CM | POA: Diagnosis not present

## 2018-03-04 DIAGNOSIS — Z992 Dependence on renal dialysis: Secondary | ICD-10-CM | POA: Diagnosis not present

## 2018-03-04 DIAGNOSIS — N186 End stage renal disease: Secondary | ICD-10-CM | POA: Diagnosis not present

## 2018-03-06 ENCOUNTER — Other Ambulatory Visit (HOSPITAL_COMMUNITY): Payer: Self-pay | Admitting: Pulmonary Disease

## 2018-03-06 ENCOUNTER — Ambulatory Visit (HOSPITAL_COMMUNITY)
Admission: RE | Admit: 2018-03-06 | Discharge: 2018-03-06 | Disposition: A | Discharge status: Home / Self Care | Payer: Medicare Other | Source: Ambulatory Visit | Attending: Pulmonary Disease | Admitting: Pulmonary Disease

## 2018-03-06 DIAGNOSIS — Z992 Dependence on renal dialysis: Secondary | ICD-10-CM | POA: Diagnosis not present

## 2018-03-06 DIAGNOSIS — N2581 Secondary hyperparathyroidism of renal origin: Secondary | ICD-10-CM | POA: Diagnosis not present

## 2018-03-06 DIAGNOSIS — J449 Chronic obstructive pulmonary disease, unspecified: Secondary | ICD-10-CM | POA: Insufficient documentation

## 2018-03-06 DIAGNOSIS — N186 End stage renal disease: Secondary | ICD-10-CM | POA: Diagnosis not present

## 2018-03-06 DIAGNOSIS — D631 Anemia in chronic kidney disease: Secondary | ICD-10-CM | POA: Diagnosis not present

## 2018-03-06 DIAGNOSIS — Z8709 Personal history of other diseases of the respiratory system: Secondary | ICD-10-CM | POA: Diagnosis not present

## 2018-03-06 DIAGNOSIS — D509 Iron deficiency anemia, unspecified: Secondary | ICD-10-CM | POA: Diagnosis not present

## 2018-03-08 DIAGNOSIS — N186 End stage renal disease: Secondary | ICD-10-CM | POA: Diagnosis not present

## 2018-03-08 DIAGNOSIS — D509 Iron deficiency anemia, unspecified: Secondary | ICD-10-CM | POA: Diagnosis not present

## 2018-03-08 DIAGNOSIS — Z992 Dependence on renal dialysis: Secondary | ICD-10-CM | POA: Diagnosis not present

## 2018-03-08 DIAGNOSIS — D631 Anemia in chronic kidney disease: Secondary | ICD-10-CM | POA: Diagnosis not present

## 2018-03-08 DIAGNOSIS — N2581 Secondary hyperparathyroidism of renal origin: Secondary | ICD-10-CM | POA: Diagnosis not present

## 2018-03-09 DIAGNOSIS — N186 End stage renal disease: Secondary | ICD-10-CM | POA: Diagnosis not present

## 2018-03-09 DIAGNOSIS — Z992 Dependence on renal dialysis: Secondary | ICD-10-CM | POA: Diagnosis not present

## 2018-03-11 DIAGNOSIS — D631 Anemia in chronic kidney disease: Secondary | ICD-10-CM | POA: Diagnosis not present

## 2018-03-11 DIAGNOSIS — N2581 Secondary hyperparathyroidism of renal origin: Secondary | ICD-10-CM | POA: Diagnosis not present

## 2018-03-11 DIAGNOSIS — D509 Iron deficiency anemia, unspecified: Secondary | ICD-10-CM | POA: Diagnosis not present

## 2018-03-11 DIAGNOSIS — N186 End stage renal disease: Secondary | ICD-10-CM | POA: Diagnosis not present

## 2018-03-11 DIAGNOSIS — Z992 Dependence on renal dialysis: Secondary | ICD-10-CM | POA: Diagnosis not present

## 2018-03-13 DIAGNOSIS — D509 Iron deficiency anemia, unspecified: Secondary | ICD-10-CM | POA: Diagnosis not present

## 2018-03-13 DIAGNOSIS — N186 End stage renal disease: Secondary | ICD-10-CM | POA: Diagnosis not present

## 2018-03-13 DIAGNOSIS — N2581 Secondary hyperparathyroidism of renal origin: Secondary | ICD-10-CM | POA: Diagnosis not present

## 2018-03-13 DIAGNOSIS — Z992 Dependence on renal dialysis: Secondary | ICD-10-CM | POA: Diagnosis not present

## 2018-03-13 DIAGNOSIS — D631 Anemia in chronic kidney disease: Secondary | ICD-10-CM | POA: Diagnosis not present

## 2018-03-15 DIAGNOSIS — D509 Iron deficiency anemia, unspecified: Secondary | ICD-10-CM | POA: Diagnosis not present

## 2018-03-15 DIAGNOSIS — N2581 Secondary hyperparathyroidism of renal origin: Secondary | ICD-10-CM | POA: Diagnosis not present

## 2018-03-15 DIAGNOSIS — N186 End stage renal disease: Secondary | ICD-10-CM | POA: Diagnosis not present

## 2018-03-15 DIAGNOSIS — D631 Anemia in chronic kidney disease: Secondary | ICD-10-CM | POA: Diagnosis not present

## 2018-03-15 DIAGNOSIS — Z992 Dependence on renal dialysis: Secondary | ICD-10-CM | POA: Diagnosis not present

## 2018-03-18 DIAGNOSIS — N186 End stage renal disease: Secondary | ICD-10-CM | POA: Diagnosis not present

## 2018-03-18 DIAGNOSIS — D631 Anemia in chronic kidney disease: Secondary | ICD-10-CM | POA: Diagnosis not present

## 2018-03-18 DIAGNOSIS — D509 Iron deficiency anemia, unspecified: Secondary | ICD-10-CM | POA: Diagnosis not present

## 2018-03-18 DIAGNOSIS — Z992 Dependence on renal dialysis: Secondary | ICD-10-CM | POA: Diagnosis not present

## 2018-03-18 DIAGNOSIS — N2581 Secondary hyperparathyroidism of renal origin: Secondary | ICD-10-CM | POA: Diagnosis not present

## 2018-03-20 DIAGNOSIS — D631 Anemia in chronic kidney disease: Secondary | ICD-10-CM | POA: Diagnosis not present

## 2018-03-20 DIAGNOSIS — N186 End stage renal disease: Secondary | ICD-10-CM | POA: Diagnosis not present

## 2018-03-20 DIAGNOSIS — Z992 Dependence on renal dialysis: Secondary | ICD-10-CM | POA: Diagnosis not present

## 2018-03-20 DIAGNOSIS — Z1159 Encounter for screening for other viral diseases: Secondary | ICD-10-CM | POA: Diagnosis not present

## 2018-03-20 DIAGNOSIS — D509 Iron deficiency anemia, unspecified: Secondary | ICD-10-CM | POA: Diagnosis not present

## 2018-03-20 DIAGNOSIS — N2581 Secondary hyperparathyroidism of renal origin: Secondary | ICD-10-CM | POA: Diagnosis not present

## 2018-03-22 DIAGNOSIS — Z992 Dependence on renal dialysis: Secondary | ICD-10-CM | POA: Diagnosis not present

## 2018-03-22 DIAGNOSIS — N2581 Secondary hyperparathyroidism of renal origin: Secondary | ICD-10-CM | POA: Diagnosis not present

## 2018-03-22 DIAGNOSIS — D631 Anemia in chronic kidney disease: Secondary | ICD-10-CM | POA: Diagnosis not present

## 2018-03-22 DIAGNOSIS — N186 End stage renal disease: Secondary | ICD-10-CM | POA: Diagnosis not present

## 2018-03-22 DIAGNOSIS — D509 Iron deficiency anemia, unspecified: Secondary | ICD-10-CM | POA: Diagnosis not present

## 2018-03-25 DIAGNOSIS — D509 Iron deficiency anemia, unspecified: Secondary | ICD-10-CM | POA: Diagnosis not present

## 2018-03-25 DIAGNOSIS — D631 Anemia in chronic kidney disease: Secondary | ICD-10-CM | POA: Diagnosis not present

## 2018-03-25 DIAGNOSIS — N186 End stage renal disease: Secondary | ICD-10-CM | POA: Diagnosis not present

## 2018-03-25 DIAGNOSIS — N2581 Secondary hyperparathyroidism of renal origin: Secondary | ICD-10-CM | POA: Diagnosis not present

## 2018-03-25 DIAGNOSIS — Z992 Dependence on renal dialysis: Secondary | ICD-10-CM | POA: Diagnosis not present

## 2018-03-27 DIAGNOSIS — N2581 Secondary hyperparathyroidism of renal origin: Secondary | ICD-10-CM | POA: Diagnosis not present

## 2018-03-27 DIAGNOSIS — D509 Iron deficiency anemia, unspecified: Secondary | ICD-10-CM | POA: Diagnosis not present

## 2018-03-27 DIAGNOSIS — Z992 Dependence on renal dialysis: Secondary | ICD-10-CM | POA: Diagnosis not present

## 2018-03-27 DIAGNOSIS — D631 Anemia in chronic kidney disease: Secondary | ICD-10-CM | POA: Diagnosis not present

## 2018-03-27 DIAGNOSIS — N186 End stage renal disease: Secondary | ICD-10-CM | POA: Diagnosis not present

## 2018-03-28 DIAGNOSIS — E43 Unspecified severe protein-calorie malnutrition: Secondary | ICD-10-CM | POA: Diagnosis not present

## 2018-03-28 DIAGNOSIS — F17200 Nicotine dependence, unspecified, uncomplicated: Secondary | ICD-10-CM | POA: Diagnosis not present

## 2018-03-28 DIAGNOSIS — Z23 Encounter for immunization: Secondary | ICD-10-CM | POA: Diagnosis not present

## 2018-03-28 DIAGNOSIS — N186 End stage renal disease: Secondary | ICD-10-CM | POA: Diagnosis not present

## 2018-03-28 DIAGNOSIS — F17210 Nicotine dependence, cigarettes, uncomplicated: Secondary | ICD-10-CM | POA: Diagnosis not present

## 2018-03-29 DIAGNOSIS — D631 Anemia in chronic kidney disease: Secondary | ICD-10-CM | POA: Diagnosis not present

## 2018-03-29 DIAGNOSIS — N2581 Secondary hyperparathyroidism of renal origin: Secondary | ICD-10-CM | POA: Diagnosis not present

## 2018-03-29 DIAGNOSIS — Z992 Dependence on renal dialysis: Secondary | ICD-10-CM | POA: Diagnosis not present

## 2018-03-29 DIAGNOSIS — D509 Iron deficiency anemia, unspecified: Secondary | ICD-10-CM | POA: Diagnosis not present

## 2018-03-29 DIAGNOSIS — N186 End stage renal disease: Secondary | ICD-10-CM | POA: Diagnosis not present

## 2018-04-01 DIAGNOSIS — Z992 Dependence on renal dialysis: Secondary | ICD-10-CM | POA: Diagnosis not present

## 2018-04-01 DIAGNOSIS — N186 End stage renal disease: Secondary | ICD-10-CM | POA: Diagnosis not present

## 2018-04-01 DIAGNOSIS — N2581 Secondary hyperparathyroidism of renal origin: Secondary | ICD-10-CM | POA: Diagnosis not present

## 2018-04-01 DIAGNOSIS — D509 Iron deficiency anemia, unspecified: Secondary | ICD-10-CM | POA: Diagnosis not present

## 2018-04-01 DIAGNOSIS — D631 Anemia in chronic kidney disease: Secondary | ICD-10-CM | POA: Diagnosis not present

## 2018-04-05 DIAGNOSIS — N186 End stage renal disease: Secondary | ICD-10-CM | POA: Diagnosis not present

## 2018-04-05 DIAGNOSIS — D509 Iron deficiency anemia, unspecified: Secondary | ICD-10-CM | POA: Diagnosis not present

## 2018-04-05 DIAGNOSIS — D631 Anemia in chronic kidney disease: Secondary | ICD-10-CM | POA: Diagnosis not present

## 2018-04-05 DIAGNOSIS — N2581 Secondary hyperparathyroidism of renal origin: Secondary | ICD-10-CM | POA: Diagnosis not present

## 2018-04-05 DIAGNOSIS — Z992 Dependence on renal dialysis: Secondary | ICD-10-CM | POA: Diagnosis not present

## 2018-04-08 DIAGNOSIS — D631 Anemia in chronic kidney disease: Secondary | ICD-10-CM | POA: Diagnosis not present

## 2018-04-08 DIAGNOSIS — D509 Iron deficiency anemia, unspecified: Secondary | ICD-10-CM | POA: Diagnosis not present

## 2018-04-08 DIAGNOSIS — Z992 Dependence on renal dialysis: Secondary | ICD-10-CM | POA: Diagnosis not present

## 2018-04-08 DIAGNOSIS — N186 End stage renal disease: Secondary | ICD-10-CM | POA: Diagnosis not present

## 2018-04-08 DIAGNOSIS — N2581 Secondary hyperparathyroidism of renal origin: Secondary | ICD-10-CM | POA: Diagnosis not present

## 2018-04-10 DIAGNOSIS — Z992 Dependence on renal dialysis: Secondary | ICD-10-CM | POA: Diagnosis not present

## 2018-04-10 DIAGNOSIS — D509 Iron deficiency anemia, unspecified: Secondary | ICD-10-CM | POA: Diagnosis not present

## 2018-04-10 DIAGNOSIS — N2581 Secondary hyperparathyroidism of renal origin: Secondary | ICD-10-CM | POA: Diagnosis not present

## 2018-04-10 DIAGNOSIS — D631 Anemia in chronic kidney disease: Secondary | ICD-10-CM | POA: Diagnosis not present

## 2018-04-10 DIAGNOSIS — N186 End stage renal disease: Secondary | ICD-10-CM | POA: Diagnosis not present

## 2018-04-12 DIAGNOSIS — N2581 Secondary hyperparathyroidism of renal origin: Secondary | ICD-10-CM | POA: Diagnosis not present

## 2018-04-12 DIAGNOSIS — D509 Iron deficiency anemia, unspecified: Secondary | ICD-10-CM | POA: Diagnosis not present

## 2018-04-12 DIAGNOSIS — Z992 Dependence on renal dialysis: Secondary | ICD-10-CM | POA: Diagnosis not present

## 2018-04-12 DIAGNOSIS — N186 End stage renal disease: Secondary | ICD-10-CM | POA: Diagnosis not present

## 2018-04-12 DIAGNOSIS — D631 Anemia in chronic kidney disease: Secondary | ICD-10-CM | POA: Diagnosis not present

## 2018-04-15 DIAGNOSIS — D631 Anemia in chronic kidney disease: Secondary | ICD-10-CM | POA: Diagnosis not present

## 2018-04-15 DIAGNOSIS — N186 End stage renal disease: Secondary | ICD-10-CM | POA: Diagnosis not present

## 2018-04-15 DIAGNOSIS — N2581 Secondary hyperparathyroidism of renal origin: Secondary | ICD-10-CM | POA: Diagnosis not present

## 2018-04-15 DIAGNOSIS — D509 Iron deficiency anemia, unspecified: Secondary | ICD-10-CM | POA: Diagnosis not present

## 2018-04-15 DIAGNOSIS — Z992 Dependence on renal dialysis: Secondary | ICD-10-CM | POA: Diagnosis not present

## 2018-04-19 DIAGNOSIS — N2581 Secondary hyperparathyroidism of renal origin: Secondary | ICD-10-CM | POA: Diagnosis not present

## 2018-04-19 DIAGNOSIS — D631 Anemia in chronic kidney disease: Secondary | ICD-10-CM | POA: Diagnosis not present

## 2018-04-19 DIAGNOSIS — D509 Iron deficiency anemia, unspecified: Secondary | ICD-10-CM | POA: Diagnosis not present

## 2018-04-19 DIAGNOSIS — N186 End stage renal disease: Secondary | ICD-10-CM | POA: Diagnosis not present

## 2018-04-19 DIAGNOSIS — Z992 Dependence on renal dialysis: Secondary | ICD-10-CM | POA: Diagnosis not present

## 2018-04-24 DIAGNOSIS — Z79899 Other long term (current) drug therapy: Secondary | ICD-10-CM | POA: Diagnosis not present

## 2018-04-24 DIAGNOSIS — D509 Iron deficiency anemia, unspecified: Secondary | ICD-10-CM | POA: Diagnosis not present

## 2018-04-24 DIAGNOSIS — D631 Anemia in chronic kidney disease: Secondary | ICD-10-CM | POA: Diagnosis not present

## 2018-04-24 DIAGNOSIS — Z992 Dependence on renal dialysis: Secondary | ICD-10-CM | POA: Diagnosis not present

## 2018-04-24 DIAGNOSIS — N186 End stage renal disease: Secondary | ICD-10-CM | POA: Diagnosis not present

## 2018-04-24 DIAGNOSIS — N2581 Secondary hyperparathyroidism of renal origin: Secondary | ICD-10-CM | POA: Diagnosis not present

## 2018-04-26 DIAGNOSIS — Z992 Dependence on renal dialysis: Secondary | ICD-10-CM | POA: Diagnosis not present

## 2018-04-26 DIAGNOSIS — N2581 Secondary hyperparathyroidism of renal origin: Secondary | ICD-10-CM | POA: Diagnosis not present

## 2018-04-26 DIAGNOSIS — D509 Iron deficiency anemia, unspecified: Secondary | ICD-10-CM | POA: Diagnosis not present

## 2018-04-26 DIAGNOSIS — D631 Anemia in chronic kidney disease: Secondary | ICD-10-CM | POA: Diagnosis not present

## 2018-04-26 DIAGNOSIS — N186 End stage renal disease: Secondary | ICD-10-CM | POA: Diagnosis not present

## 2018-04-29 DIAGNOSIS — D509 Iron deficiency anemia, unspecified: Secondary | ICD-10-CM | POA: Diagnosis not present

## 2018-04-29 DIAGNOSIS — Z992 Dependence on renal dialysis: Secondary | ICD-10-CM | POA: Diagnosis not present

## 2018-04-29 DIAGNOSIS — D631 Anemia in chronic kidney disease: Secondary | ICD-10-CM | POA: Diagnosis not present

## 2018-04-29 DIAGNOSIS — N2581 Secondary hyperparathyroidism of renal origin: Secondary | ICD-10-CM | POA: Diagnosis not present

## 2018-04-29 DIAGNOSIS — N186 End stage renal disease: Secondary | ICD-10-CM | POA: Diagnosis not present

## 2018-05-01 DIAGNOSIS — D631 Anemia in chronic kidney disease: Secondary | ICD-10-CM | POA: Diagnosis not present

## 2018-05-01 DIAGNOSIS — N2581 Secondary hyperparathyroidism of renal origin: Secondary | ICD-10-CM | POA: Diagnosis not present

## 2018-05-01 DIAGNOSIS — D509 Iron deficiency anemia, unspecified: Secondary | ICD-10-CM | POA: Diagnosis not present

## 2018-05-01 DIAGNOSIS — Z992 Dependence on renal dialysis: Secondary | ICD-10-CM | POA: Diagnosis not present

## 2018-05-01 DIAGNOSIS — N186 End stage renal disease: Secondary | ICD-10-CM | POA: Diagnosis not present

## 2018-05-03 DIAGNOSIS — Z992 Dependence on renal dialysis: Secondary | ICD-10-CM | POA: Diagnosis not present

## 2018-05-03 DIAGNOSIS — D509 Iron deficiency anemia, unspecified: Secondary | ICD-10-CM | POA: Diagnosis not present

## 2018-05-03 DIAGNOSIS — N186 End stage renal disease: Secondary | ICD-10-CM | POA: Diagnosis not present

## 2018-05-03 DIAGNOSIS — N2581 Secondary hyperparathyroidism of renal origin: Secondary | ICD-10-CM | POA: Diagnosis not present

## 2018-05-03 DIAGNOSIS — D631 Anemia in chronic kidney disease: Secondary | ICD-10-CM | POA: Diagnosis not present

## 2018-05-06 DIAGNOSIS — N186 End stage renal disease: Secondary | ICD-10-CM | POA: Diagnosis not present

## 2018-05-06 DIAGNOSIS — D509 Iron deficiency anemia, unspecified: Secondary | ICD-10-CM | POA: Diagnosis not present

## 2018-05-06 DIAGNOSIS — D631 Anemia in chronic kidney disease: Secondary | ICD-10-CM | POA: Diagnosis not present

## 2018-05-06 DIAGNOSIS — Z992 Dependence on renal dialysis: Secondary | ICD-10-CM | POA: Diagnosis not present

## 2018-05-06 DIAGNOSIS — N2581 Secondary hyperparathyroidism of renal origin: Secondary | ICD-10-CM | POA: Diagnosis not present

## 2018-05-08 DIAGNOSIS — Z992 Dependence on renal dialysis: Secondary | ICD-10-CM | POA: Diagnosis not present

## 2018-05-08 DIAGNOSIS — D509 Iron deficiency anemia, unspecified: Secondary | ICD-10-CM | POA: Diagnosis not present

## 2018-05-08 DIAGNOSIS — D631 Anemia in chronic kidney disease: Secondary | ICD-10-CM | POA: Diagnosis not present

## 2018-05-08 DIAGNOSIS — N186 End stage renal disease: Secondary | ICD-10-CM | POA: Diagnosis not present

## 2018-05-08 DIAGNOSIS — N2581 Secondary hyperparathyroidism of renal origin: Secondary | ICD-10-CM | POA: Diagnosis not present

## 2018-05-09 DIAGNOSIS — N186 End stage renal disease: Secondary | ICD-10-CM | POA: Diagnosis not present

## 2018-05-09 DIAGNOSIS — Z992 Dependence on renal dialysis: Secondary | ICD-10-CM | POA: Diagnosis not present

## 2018-05-13 DIAGNOSIS — N186 End stage renal disease: Secondary | ICD-10-CM | POA: Diagnosis not present

## 2018-05-13 DIAGNOSIS — N2581 Secondary hyperparathyroidism of renal origin: Secondary | ICD-10-CM | POA: Diagnosis not present

## 2018-05-13 DIAGNOSIS — Z992 Dependence on renal dialysis: Secondary | ICD-10-CM | POA: Diagnosis not present

## 2018-05-13 DIAGNOSIS — D631 Anemia in chronic kidney disease: Secondary | ICD-10-CM | POA: Diagnosis not present

## 2018-05-13 DIAGNOSIS — D509 Iron deficiency anemia, unspecified: Secondary | ICD-10-CM | POA: Diagnosis not present

## 2018-05-15 DIAGNOSIS — D509 Iron deficiency anemia, unspecified: Secondary | ICD-10-CM | POA: Diagnosis not present

## 2018-05-15 DIAGNOSIS — N186 End stage renal disease: Secondary | ICD-10-CM | POA: Diagnosis not present

## 2018-05-15 DIAGNOSIS — Z992 Dependence on renal dialysis: Secondary | ICD-10-CM | POA: Diagnosis not present

## 2018-05-15 DIAGNOSIS — D631 Anemia in chronic kidney disease: Secondary | ICD-10-CM | POA: Diagnosis not present

## 2018-05-15 DIAGNOSIS — N2581 Secondary hyperparathyroidism of renal origin: Secondary | ICD-10-CM | POA: Diagnosis not present

## 2018-05-17 DIAGNOSIS — Z992 Dependence on renal dialysis: Secondary | ICD-10-CM | POA: Diagnosis not present

## 2018-05-17 DIAGNOSIS — N186 End stage renal disease: Secondary | ICD-10-CM | POA: Diagnosis not present

## 2018-05-17 DIAGNOSIS — D509 Iron deficiency anemia, unspecified: Secondary | ICD-10-CM | POA: Diagnosis not present

## 2018-05-17 DIAGNOSIS — D631 Anemia in chronic kidney disease: Secondary | ICD-10-CM | POA: Diagnosis not present

## 2018-05-17 DIAGNOSIS — N2581 Secondary hyperparathyroidism of renal origin: Secondary | ICD-10-CM | POA: Diagnosis not present

## 2018-05-20 DIAGNOSIS — Z992 Dependence on renal dialysis: Secondary | ICD-10-CM | POA: Diagnosis not present

## 2018-05-20 DIAGNOSIS — D509 Iron deficiency anemia, unspecified: Secondary | ICD-10-CM | POA: Diagnosis not present

## 2018-05-20 DIAGNOSIS — N186 End stage renal disease: Secondary | ICD-10-CM | POA: Diagnosis not present

## 2018-05-20 DIAGNOSIS — D631 Anemia in chronic kidney disease: Secondary | ICD-10-CM | POA: Diagnosis not present

## 2018-05-20 DIAGNOSIS — N2581 Secondary hyperparathyroidism of renal origin: Secondary | ICD-10-CM | POA: Diagnosis not present

## 2018-05-22 DIAGNOSIS — N186 End stage renal disease: Secondary | ICD-10-CM | POA: Diagnosis not present

## 2018-05-22 DIAGNOSIS — D509 Iron deficiency anemia, unspecified: Secondary | ICD-10-CM | POA: Diagnosis not present

## 2018-05-22 DIAGNOSIS — N2581 Secondary hyperparathyroidism of renal origin: Secondary | ICD-10-CM | POA: Diagnosis not present

## 2018-05-22 DIAGNOSIS — Z992 Dependence on renal dialysis: Secondary | ICD-10-CM | POA: Diagnosis not present

## 2018-05-22 DIAGNOSIS — D631 Anemia in chronic kidney disease: Secondary | ICD-10-CM | POA: Diagnosis not present

## 2018-05-27 DIAGNOSIS — Z992 Dependence on renal dialysis: Secondary | ICD-10-CM | POA: Diagnosis not present

## 2018-05-27 DIAGNOSIS — D631 Anemia in chronic kidney disease: Secondary | ICD-10-CM | POA: Diagnosis not present

## 2018-05-27 DIAGNOSIS — N186 End stage renal disease: Secondary | ICD-10-CM | POA: Diagnosis not present

## 2018-05-27 DIAGNOSIS — D509 Iron deficiency anemia, unspecified: Secondary | ICD-10-CM | POA: Diagnosis not present

## 2018-05-27 DIAGNOSIS — N2581 Secondary hyperparathyroidism of renal origin: Secondary | ICD-10-CM | POA: Diagnosis not present

## 2018-05-29 DIAGNOSIS — N186 End stage renal disease: Secondary | ICD-10-CM | POA: Diagnosis not present

## 2018-05-29 DIAGNOSIS — Z992 Dependence on renal dialysis: Secondary | ICD-10-CM | POA: Diagnosis not present

## 2018-05-29 DIAGNOSIS — D509 Iron deficiency anemia, unspecified: Secondary | ICD-10-CM | POA: Diagnosis not present

## 2018-05-29 DIAGNOSIS — D631 Anemia in chronic kidney disease: Secondary | ICD-10-CM | POA: Diagnosis not present

## 2018-05-29 DIAGNOSIS — N2581 Secondary hyperparathyroidism of renal origin: Secondary | ICD-10-CM | POA: Diagnosis not present

## 2018-05-31 DIAGNOSIS — N2581 Secondary hyperparathyroidism of renal origin: Secondary | ICD-10-CM | POA: Diagnosis not present

## 2018-05-31 DIAGNOSIS — N186 End stage renal disease: Secondary | ICD-10-CM | POA: Diagnosis not present

## 2018-05-31 DIAGNOSIS — Z992 Dependence on renal dialysis: Secondary | ICD-10-CM | POA: Diagnosis not present

## 2018-05-31 DIAGNOSIS — D509 Iron deficiency anemia, unspecified: Secondary | ICD-10-CM | POA: Diagnosis not present

## 2018-05-31 DIAGNOSIS — D631 Anemia in chronic kidney disease: Secondary | ICD-10-CM | POA: Diagnosis not present

## 2018-06-03 DIAGNOSIS — N2581 Secondary hyperparathyroidism of renal origin: Secondary | ICD-10-CM | POA: Diagnosis not present

## 2018-06-03 DIAGNOSIS — N186 End stage renal disease: Secondary | ICD-10-CM | POA: Diagnosis not present

## 2018-06-03 DIAGNOSIS — D509 Iron deficiency anemia, unspecified: Secondary | ICD-10-CM | POA: Diagnosis not present

## 2018-06-03 DIAGNOSIS — D631 Anemia in chronic kidney disease: Secondary | ICD-10-CM | POA: Diagnosis not present

## 2018-06-03 DIAGNOSIS — Z992 Dependence on renal dialysis: Secondary | ICD-10-CM | POA: Diagnosis not present

## 2018-06-05 DIAGNOSIS — D631 Anemia in chronic kidney disease: Secondary | ICD-10-CM | POA: Diagnosis not present

## 2018-06-05 DIAGNOSIS — N2581 Secondary hyperparathyroidism of renal origin: Secondary | ICD-10-CM | POA: Diagnosis not present

## 2018-06-05 DIAGNOSIS — Z992 Dependence on renal dialysis: Secondary | ICD-10-CM | POA: Diagnosis not present

## 2018-06-05 DIAGNOSIS — D509 Iron deficiency anemia, unspecified: Secondary | ICD-10-CM | POA: Diagnosis not present

## 2018-06-05 DIAGNOSIS — N186 End stage renal disease: Secondary | ICD-10-CM | POA: Diagnosis not present

## 2018-06-07 DIAGNOSIS — N2581 Secondary hyperparathyroidism of renal origin: Secondary | ICD-10-CM | POA: Diagnosis not present

## 2018-06-07 DIAGNOSIS — Z992 Dependence on renal dialysis: Secondary | ICD-10-CM | POA: Diagnosis not present

## 2018-06-07 DIAGNOSIS — N186 End stage renal disease: Secondary | ICD-10-CM | POA: Diagnosis not present

## 2018-06-07 DIAGNOSIS — D509 Iron deficiency anemia, unspecified: Secondary | ICD-10-CM | POA: Diagnosis not present

## 2018-06-07 DIAGNOSIS — D631 Anemia in chronic kidney disease: Secondary | ICD-10-CM | POA: Diagnosis not present

## 2018-06-08 DIAGNOSIS — Z992 Dependence on renal dialysis: Secondary | ICD-10-CM | POA: Diagnosis not present

## 2018-06-08 DIAGNOSIS — N186 End stage renal disease: Secondary | ICD-10-CM | POA: Diagnosis not present

## 2018-06-10 DIAGNOSIS — D509 Iron deficiency anemia, unspecified: Secondary | ICD-10-CM | POA: Diagnosis not present

## 2018-06-10 DIAGNOSIS — Z992 Dependence on renal dialysis: Secondary | ICD-10-CM | POA: Diagnosis not present

## 2018-06-10 DIAGNOSIS — N2581 Secondary hyperparathyroidism of renal origin: Secondary | ICD-10-CM | POA: Diagnosis not present

## 2018-06-10 DIAGNOSIS — N186 End stage renal disease: Secondary | ICD-10-CM | POA: Diagnosis not present

## 2018-06-10 DIAGNOSIS — D631 Anemia in chronic kidney disease: Secondary | ICD-10-CM | POA: Diagnosis not present

## 2018-06-12 DIAGNOSIS — D509 Iron deficiency anemia, unspecified: Secondary | ICD-10-CM | POA: Diagnosis not present

## 2018-06-12 DIAGNOSIS — D631 Anemia in chronic kidney disease: Secondary | ICD-10-CM | POA: Diagnosis not present

## 2018-06-12 DIAGNOSIS — Z992 Dependence on renal dialysis: Secondary | ICD-10-CM | POA: Diagnosis not present

## 2018-06-12 DIAGNOSIS — N186 End stage renal disease: Secondary | ICD-10-CM | POA: Diagnosis not present

## 2018-06-12 DIAGNOSIS — N2581 Secondary hyperparathyroidism of renal origin: Secondary | ICD-10-CM | POA: Diagnosis not present

## 2018-06-13 DIAGNOSIS — H40003 Preglaucoma, unspecified, bilateral: Secondary | ICD-10-CM | POA: Diagnosis not present

## 2018-06-14 DIAGNOSIS — N186 End stage renal disease: Secondary | ICD-10-CM | POA: Diagnosis not present

## 2018-06-14 DIAGNOSIS — Z992 Dependence on renal dialysis: Secondary | ICD-10-CM | POA: Diagnosis not present

## 2018-06-14 DIAGNOSIS — D631 Anemia in chronic kidney disease: Secondary | ICD-10-CM | POA: Diagnosis not present

## 2018-06-14 DIAGNOSIS — N2581 Secondary hyperparathyroidism of renal origin: Secondary | ICD-10-CM | POA: Diagnosis not present

## 2018-06-14 DIAGNOSIS — D509 Iron deficiency anemia, unspecified: Secondary | ICD-10-CM | POA: Diagnosis not present

## 2018-06-17 DIAGNOSIS — D509 Iron deficiency anemia, unspecified: Secondary | ICD-10-CM | POA: Diagnosis not present

## 2018-06-17 DIAGNOSIS — Z992 Dependence on renal dialysis: Secondary | ICD-10-CM | POA: Diagnosis not present

## 2018-06-17 DIAGNOSIS — N186 End stage renal disease: Secondary | ICD-10-CM | POA: Diagnosis not present

## 2018-06-17 DIAGNOSIS — N2581 Secondary hyperparathyroidism of renal origin: Secondary | ICD-10-CM | POA: Diagnosis not present

## 2018-06-17 DIAGNOSIS — D631 Anemia in chronic kidney disease: Secondary | ICD-10-CM | POA: Diagnosis not present

## 2018-06-19 DIAGNOSIS — D631 Anemia in chronic kidney disease: Secondary | ICD-10-CM | POA: Diagnosis not present

## 2018-06-19 DIAGNOSIS — D509 Iron deficiency anemia, unspecified: Secondary | ICD-10-CM | POA: Diagnosis not present

## 2018-06-19 DIAGNOSIS — N186 End stage renal disease: Secondary | ICD-10-CM | POA: Diagnosis not present

## 2018-06-19 DIAGNOSIS — N2581 Secondary hyperparathyroidism of renal origin: Secondary | ICD-10-CM | POA: Diagnosis not present

## 2018-06-19 DIAGNOSIS — Z992 Dependence on renal dialysis: Secondary | ICD-10-CM | POA: Diagnosis not present

## 2018-06-21 DIAGNOSIS — D509 Iron deficiency anemia, unspecified: Secondary | ICD-10-CM | POA: Diagnosis not present

## 2018-06-21 DIAGNOSIS — Z992 Dependence on renal dialysis: Secondary | ICD-10-CM | POA: Diagnosis not present

## 2018-06-21 DIAGNOSIS — N186 End stage renal disease: Secondary | ICD-10-CM | POA: Diagnosis not present

## 2018-06-21 DIAGNOSIS — D631 Anemia in chronic kidney disease: Secondary | ICD-10-CM | POA: Diagnosis not present

## 2018-06-21 DIAGNOSIS — N2581 Secondary hyperparathyroidism of renal origin: Secondary | ICD-10-CM | POA: Diagnosis not present

## 2018-06-26 DIAGNOSIS — D509 Iron deficiency anemia, unspecified: Secondary | ICD-10-CM | POA: Diagnosis not present

## 2018-06-26 DIAGNOSIS — N2581 Secondary hyperparathyroidism of renal origin: Secondary | ICD-10-CM | POA: Diagnosis not present

## 2018-06-26 DIAGNOSIS — D631 Anemia in chronic kidney disease: Secondary | ICD-10-CM | POA: Diagnosis not present

## 2018-06-26 DIAGNOSIS — N186 End stage renal disease: Secondary | ICD-10-CM | POA: Diagnosis not present

## 2018-06-26 DIAGNOSIS — Z992 Dependence on renal dialysis: Secondary | ICD-10-CM | POA: Diagnosis not present

## 2018-06-27 DIAGNOSIS — I739 Peripheral vascular disease, unspecified: Secondary | ICD-10-CM | POA: Diagnosis not present

## 2018-06-27 DIAGNOSIS — Z1389 Encounter for screening for other disorder: Secondary | ICD-10-CM | POA: Diagnosis not present

## 2018-06-27 DIAGNOSIS — Z1331 Encounter for screening for depression: Secondary | ICD-10-CM | POA: Diagnosis not present

## 2018-06-27 DIAGNOSIS — E43 Unspecified severe protein-calorie malnutrition: Secondary | ICD-10-CM | POA: Diagnosis not present

## 2018-06-27 DIAGNOSIS — N186 End stage renal disease: Secondary | ICD-10-CM | POA: Diagnosis not present

## 2018-06-27 DIAGNOSIS — I10 Essential (primary) hypertension: Secondary | ICD-10-CM | POA: Diagnosis not present

## 2018-06-28 DIAGNOSIS — N186 End stage renal disease: Secondary | ICD-10-CM | POA: Diagnosis not present

## 2018-06-28 DIAGNOSIS — D509 Iron deficiency anemia, unspecified: Secondary | ICD-10-CM | POA: Diagnosis not present

## 2018-06-28 DIAGNOSIS — Z992 Dependence on renal dialysis: Secondary | ICD-10-CM | POA: Diagnosis not present

## 2018-06-28 DIAGNOSIS — D631 Anemia in chronic kidney disease: Secondary | ICD-10-CM | POA: Diagnosis not present

## 2018-06-28 DIAGNOSIS — N2581 Secondary hyperparathyroidism of renal origin: Secondary | ICD-10-CM | POA: Diagnosis not present

## 2018-06-30 DIAGNOSIS — D509 Iron deficiency anemia, unspecified: Secondary | ICD-10-CM | POA: Diagnosis not present

## 2018-06-30 DIAGNOSIS — N2581 Secondary hyperparathyroidism of renal origin: Secondary | ICD-10-CM | POA: Diagnosis not present

## 2018-06-30 DIAGNOSIS — D631 Anemia in chronic kidney disease: Secondary | ICD-10-CM | POA: Diagnosis not present

## 2018-06-30 DIAGNOSIS — N186 End stage renal disease: Secondary | ICD-10-CM | POA: Diagnosis not present

## 2018-06-30 DIAGNOSIS — Z992 Dependence on renal dialysis: Secondary | ICD-10-CM | POA: Diagnosis not present

## 2018-07-02 DIAGNOSIS — N2581 Secondary hyperparathyroidism of renal origin: Secondary | ICD-10-CM | POA: Diagnosis not present

## 2018-07-02 DIAGNOSIS — D631 Anemia in chronic kidney disease: Secondary | ICD-10-CM | POA: Diagnosis not present

## 2018-07-02 DIAGNOSIS — D509 Iron deficiency anemia, unspecified: Secondary | ICD-10-CM | POA: Diagnosis not present

## 2018-07-02 DIAGNOSIS — Z992 Dependence on renal dialysis: Secondary | ICD-10-CM | POA: Diagnosis not present

## 2018-07-02 DIAGNOSIS — N186 End stage renal disease: Secondary | ICD-10-CM | POA: Diagnosis not present

## 2018-07-05 DIAGNOSIS — D631 Anemia in chronic kidney disease: Secondary | ICD-10-CM | POA: Diagnosis not present

## 2018-07-05 DIAGNOSIS — Z992 Dependence on renal dialysis: Secondary | ICD-10-CM | POA: Diagnosis not present

## 2018-07-05 DIAGNOSIS — N2581 Secondary hyperparathyroidism of renal origin: Secondary | ICD-10-CM | POA: Diagnosis not present

## 2018-07-05 DIAGNOSIS — D509 Iron deficiency anemia, unspecified: Secondary | ICD-10-CM | POA: Diagnosis not present

## 2018-07-05 DIAGNOSIS — N186 End stage renal disease: Secondary | ICD-10-CM | POA: Diagnosis not present

## 2018-07-08 DIAGNOSIS — D509 Iron deficiency anemia, unspecified: Secondary | ICD-10-CM | POA: Diagnosis not present

## 2018-07-08 DIAGNOSIS — N2581 Secondary hyperparathyroidism of renal origin: Secondary | ICD-10-CM | POA: Diagnosis not present

## 2018-07-08 DIAGNOSIS — D631 Anemia in chronic kidney disease: Secondary | ICD-10-CM | POA: Diagnosis not present

## 2018-07-08 DIAGNOSIS — N186 End stage renal disease: Secondary | ICD-10-CM | POA: Diagnosis not present

## 2018-07-08 DIAGNOSIS — Z992 Dependence on renal dialysis: Secondary | ICD-10-CM | POA: Diagnosis not present

## 2018-07-10 DIAGNOSIS — D509 Iron deficiency anemia, unspecified: Secondary | ICD-10-CM | POA: Diagnosis not present

## 2018-07-10 DIAGNOSIS — N2581 Secondary hyperparathyroidism of renal origin: Secondary | ICD-10-CM | POA: Diagnosis not present

## 2018-07-10 DIAGNOSIS — N186 End stage renal disease: Secondary | ICD-10-CM | POA: Diagnosis not present

## 2018-07-10 DIAGNOSIS — D631 Anemia in chronic kidney disease: Secondary | ICD-10-CM | POA: Diagnosis not present

## 2018-07-10 DIAGNOSIS — Z992 Dependence on renal dialysis: Secondary | ICD-10-CM | POA: Diagnosis not present

## 2018-07-12 DIAGNOSIS — D509 Iron deficiency anemia, unspecified: Secondary | ICD-10-CM | POA: Diagnosis not present

## 2018-07-12 DIAGNOSIS — N186 End stage renal disease: Secondary | ICD-10-CM | POA: Diagnosis not present

## 2018-07-12 DIAGNOSIS — N2581 Secondary hyperparathyroidism of renal origin: Secondary | ICD-10-CM | POA: Diagnosis not present

## 2018-07-12 DIAGNOSIS — Z992 Dependence on renal dialysis: Secondary | ICD-10-CM | POA: Diagnosis not present

## 2018-07-12 DIAGNOSIS — D631 Anemia in chronic kidney disease: Secondary | ICD-10-CM | POA: Diagnosis not present

## 2018-07-15 DIAGNOSIS — N186 End stage renal disease: Secondary | ICD-10-CM | POA: Diagnosis not present

## 2018-07-15 DIAGNOSIS — D509 Iron deficiency anemia, unspecified: Secondary | ICD-10-CM | POA: Diagnosis not present

## 2018-07-15 DIAGNOSIS — D631 Anemia in chronic kidney disease: Secondary | ICD-10-CM | POA: Diagnosis not present

## 2018-07-15 DIAGNOSIS — N2581 Secondary hyperparathyroidism of renal origin: Secondary | ICD-10-CM | POA: Diagnosis not present

## 2018-07-15 DIAGNOSIS — Z992 Dependence on renal dialysis: Secondary | ICD-10-CM | POA: Diagnosis not present

## 2018-07-17 DIAGNOSIS — N186 End stage renal disease: Secondary | ICD-10-CM | POA: Diagnosis not present

## 2018-07-17 DIAGNOSIS — D631 Anemia in chronic kidney disease: Secondary | ICD-10-CM | POA: Diagnosis not present

## 2018-07-17 DIAGNOSIS — Z992 Dependence on renal dialysis: Secondary | ICD-10-CM | POA: Diagnosis not present

## 2018-07-17 DIAGNOSIS — N2581 Secondary hyperparathyroidism of renal origin: Secondary | ICD-10-CM | POA: Diagnosis not present

## 2018-07-17 DIAGNOSIS — D509 Iron deficiency anemia, unspecified: Secondary | ICD-10-CM | POA: Diagnosis not present

## 2018-07-19 DIAGNOSIS — D509 Iron deficiency anemia, unspecified: Secondary | ICD-10-CM | POA: Diagnosis not present

## 2018-07-19 DIAGNOSIS — D631 Anemia in chronic kidney disease: Secondary | ICD-10-CM | POA: Diagnosis not present

## 2018-07-19 DIAGNOSIS — N186 End stage renal disease: Secondary | ICD-10-CM | POA: Diagnosis not present

## 2018-07-19 DIAGNOSIS — N2581 Secondary hyperparathyroidism of renal origin: Secondary | ICD-10-CM | POA: Diagnosis not present

## 2018-07-19 DIAGNOSIS — Z992 Dependence on renal dialysis: Secondary | ICD-10-CM | POA: Diagnosis not present

## 2018-07-23 ENCOUNTER — Inpatient Hospital Stay (HOSPITAL_COMMUNITY)
Admission: EM | Admit: 2018-07-23 | Discharge: 2018-07-25 | DRG: 189 | Disposition: A | Discharge status: Home / Self Care | Payer: Medicare Other | Attending: Internal Medicine | Admitting: Internal Medicine

## 2018-07-23 ENCOUNTER — Other Ambulatory Visit: Payer: Self-pay

## 2018-07-23 ENCOUNTER — Encounter (HOSPITAL_COMMUNITY): Payer: Self-pay | Admitting: *Deleted

## 2018-07-23 ENCOUNTER — Emergency Department (HOSPITAL_COMMUNITY): Payer: Medicare Other

## 2018-07-23 DIAGNOSIS — I120 Hypertensive chronic kidney disease with stage 5 chronic kidney disease or end stage renal disease: Secondary | ICD-10-CM | POA: Diagnosis not present

## 2018-07-23 DIAGNOSIS — J810 Acute pulmonary edema: Principal | ICD-10-CM | POA: Diagnosis present

## 2018-07-23 DIAGNOSIS — I739 Peripheral vascular disease, unspecified: Secondary | ICD-10-CM | POA: Diagnosis present

## 2018-07-23 DIAGNOSIS — J449 Chronic obstructive pulmonary disease, unspecified: Secondary | ICD-10-CM | POA: Diagnosis present

## 2018-07-23 DIAGNOSIS — Z720 Tobacco use: Secondary | ICD-10-CM

## 2018-07-23 DIAGNOSIS — Z992 Dependence on renal dialysis: Secondary | ICD-10-CM

## 2018-07-23 DIAGNOSIS — R0902 Hypoxemia: Secondary | ICD-10-CM

## 2018-07-23 DIAGNOSIS — N186 End stage renal disease: Secondary | ICD-10-CM | POA: Diagnosis not present

## 2018-07-23 DIAGNOSIS — Z888 Allergy status to other drugs, medicaments and biological substances status: Secondary | ICD-10-CM

## 2018-07-23 DIAGNOSIS — E875 Hyperkalemia: Secondary | ICD-10-CM | POA: Diagnosis not present

## 2018-07-23 DIAGNOSIS — J9601 Acute respiratory failure with hypoxia: Secondary | ICD-10-CM | POA: Diagnosis not present

## 2018-07-23 DIAGNOSIS — J811 Chronic pulmonary edema: Secondary | ICD-10-CM | POA: Diagnosis present

## 2018-07-23 DIAGNOSIS — R079 Chest pain, unspecified: Secondary | ICD-10-CM

## 2018-07-23 DIAGNOSIS — Z9114 Patient's other noncompliance with medication regimen: Secondary | ICD-10-CM | POA: Diagnosis not present

## 2018-07-23 DIAGNOSIS — D631 Anemia in chronic kidney disease: Secondary | ICD-10-CM | POA: Diagnosis present

## 2018-07-23 DIAGNOSIS — Z79899 Other long term (current) drug therapy: Secondary | ICD-10-CM

## 2018-07-23 DIAGNOSIS — K219 Gastro-esophageal reflux disease without esophagitis: Secondary | ICD-10-CM | POA: Diagnosis present

## 2018-07-23 DIAGNOSIS — I160 Hypertensive urgency: Secondary | ICD-10-CM | POA: Diagnosis present

## 2018-07-23 DIAGNOSIS — F17210 Nicotine dependence, cigarettes, uncomplicated: Secondary | ICD-10-CM | POA: Diagnosis present

## 2018-07-23 DIAGNOSIS — Z9119 Patient's noncompliance with other medical treatment and regimen: Secondary | ICD-10-CM

## 2018-07-23 DIAGNOSIS — Z7951 Long term (current) use of inhaled steroids: Secondary | ICD-10-CM

## 2018-07-23 DIAGNOSIS — R0602 Shortness of breath: Secondary | ICD-10-CM | POA: Diagnosis not present

## 2018-07-23 DIAGNOSIS — I10 Essential (primary) hypertension: Secondary | ICD-10-CM | POA: Diagnosis present

## 2018-07-23 HISTORY — DX: Gastro-esophageal reflux disease without esophagitis: K21.9

## 2018-07-23 LAB — I-STAT CHEM 8, ED
BUN: 78 mg/dL — ABNORMAL HIGH (ref 6–20)
Calcium, Ion: 1.12 mmol/L — ABNORMAL LOW (ref 1.15–1.40)
Chloride: 112 mmol/L — ABNORMAL HIGH (ref 98–111)
Creatinine, Ser: 15.4 mg/dL — ABNORMAL HIGH (ref 0.61–1.24)
Glucose, Bld: 91 mg/dL (ref 70–99)
HCT: 32 % — ABNORMAL LOW (ref 39.0–52.0)
Hemoglobin: 10.9 g/dL — ABNORMAL LOW (ref 13.0–17.0)
Potassium: 5.3 mmol/L — ABNORMAL HIGH (ref 3.5–5.1)
Sodium: 136 mmol/L (ref 135–145)
TCO2: 17 mmol/L — ABNORMAL LOW (ref 22–32)

## 2018-07-23 LAB — TROPONIN I: Troponin I: <0.03 ng/mL (ref <0.03)

## 2018-07-23 LAB — CBC
HCT: 32.1 % — ABNORMAL LOW (ref 39.0–52.0)
Hemoglobin: 9.8 g/dL — ABNORMAL LOW (ref 13.0–17.0)
MCH: 34.9 pg — ABNORMAL HIGH (ref 26.0–34.0)
MCHC: 30.5 g/dL (ref 30.0–36.0)
MCV: 114.2 fL — AB (ref 80.0–100.0)
NRBC: 0 % (ref 0.0–0.2)
PLATELETS: 332 10*3/uL (ref 150–400)
RBC: 2.81 MIL/uL — ABNORMAL LOW (ref 4.22–5.81)
RDW: 14.8 % (ref 11.5–15.5)
WBC: 10.8 10*3/uL — ABNORMAL HIGH (ref 4.0–10.5)

## 2018-07-23 LAB — BASIC METABOLIC PANEL
ANION GAP: 15 (ref 5–15)
BUN: 78 mg/dL — ABNORMAL HIGH (ref 6–20)
CALCIUM: 8.8 mg/dL — AB (ref 8.9–10.3)
CO2: 16 mmol/L — AB (ref 22–32)
Chloride: 105 mmol/L (ref 98–111)
Creatinine, Ser: 13.29 mg/dL — ABNORMAL HIGH (ref 0.61–1.24)
GFR, EST AFRICAN AMERICAN: 4 mL/min — AB (ref >60)
GFR, EST NON AFRICAN AMERICAN: 4 mL/min — AB (ref >60)
Glucose, Bld: 125 mg/dL — ABNORMAL HIGH (ref 70–99)
Potassium: 7 mmol/L (ref 3.5–5.1)
SODIUM: 136 mmol/L (ref 135–145)

## 2018-07-23 MED ORDER — SODIUM CHLORIDE 0.9 % IV SOLN: 100.0000 mL | INTRAVENOUS | Status: DC | PRN

## 2018-07-23 MED ORDER — OXYCODONE-ACETAMINOPHEN 5-325 MG PO TABS
1.0000 | ORAL_TABLET | Freq: Four times a day (QID) | ORAL | Status: DC | PRN
  Administered 2018-07-24: 1 via ORAL
  Filled 2018-07-23: qty 1

## 2018-07-23 MED ORDER — HYDRALAZINE HCL 25 MG PO TABS
25.0000 mg | ORAL_TABLET | Freq: Three times a day (TID) | ORAL | Status: DC
  Administered 2018-07-24 – 2018-07-25 (×5): 25 mg via ORAL
  Filled 2018-07-23 (×5): qty 1

## 2018-07-23 MED ORDER — MOMETASONE FURO-FORMOTEROL FUM 200-5 MCG/ACT IN AERO
2.0000 | INHALATION_SPRAY | Freq: Two times a day (BID) | RESPIRATORY_TRACT | Status: DC
  Administered 2018-07-24 – 2018-07-25 (×3): 2 via RESPIRATORY_TRACT
  Filled 2018-07-23 (×2): qty 8.8

## 2018-07-23 MED ORDER — ALBUTEROL SULFATE (2.5 MG/3ML) 0.083% IN NEBU: 3.0000 mL | INHALATION_SOLUTION | Freq: Four times a day (QID) | RESPIRATORY_TRACT | Status: DC | PRN

## 2018-07-23 MED ORDER — DEXTROSE 50 % IV SOLN
1.0000 | Freq: Once | INTRAVENOUS | Status: AC
  Administered 2018-07-23: 50 mL via INTRAVENOUS

## 2018-07-23 MED ORDER — SODIUM CHLORIDE 0.9% FLUSH
3.0000 mL | Freq: Two times a day (BID) | INTRAVENOUS | Status: DC
  Administered 2018-07-24 – 2018-07-25 (×2): 3 mL via INTRAVENOUS

## 2018-07-23 MED ORDER — LIDOCAINE HCL (PF) 1 % IJ SOLN: 5.0000 mL | INTRAMUSCULAR | Status: DC | PRN

## 2018-07-23 MED ORDER — ONDANSETRON HCL 4 MG/2ML IJ SOLN: 4.0000 mg | Freq: Four times a day (QID) | INTRAMUSCULAR | Status: DC | PRN

## 2018-07-23 MED ORDER — SODIUM ZIRCONIUM CYCLOSILICATE 10 G PO PACK
10.0000 g | PACK | Freq: Once | ORAL | Status: AC
  Administered 2018-07-23: 10 g via ORAL
  Filled 2018-07-23: qty 1

## 2018-07-23 MED ORDER — ALBUTEROL (5 MG/ML) CONTINUOUS INHALATION SOLN
10.0000 mg/h | INHALATION_SOLUTION | Freq: Once | RESPIRATORY_TRACT | Status: AC
  Administered 2018-07-23: 10 mg/h via RESPIRATORY_TRACT
  Filled 2018-07-23: qty 20

## 2018-07-23 MED ORDER — CYCLOBENZAPRINE HCL 10 MG PO TABS: 5.0000 mg | ORAL_TABLET | Freq: Two times a day (BID) | ORAL | Status: DC | PRN

## 2018-07-23 MED ORDER — LIDOCAINE-PRILOCAINE 2.5-2.5 % EX CREA: 1.0000 "application " | TOPICAL_CREAM | CUTANEOUS | Status: DC | PRN

## 2018-07-23 MED ORDER — AMLODIPINE BESYLATE 5 MG PO TABS
10.0000 mg | ORAL_TABLET | Freq: Every day | ORAL | Status: DC
  Administered 2018-07-24 – 2018-07-25 (×2): 10 mg via ORAL
  Filled 2018-07-23 (×2): qty 2

## 2018-07-23 MED ORDER — SODIUM CHLORIDE 0.9 % IV SOLN
INTRAVENOUS | Status: DC
  Administered 2018-07-23: 20:00:00 via INTRAVENOUS

## 2018-07-23 MED ORDER — PANTOPRAZOLE SODIUM 40 MG PO TBEC
40.0000 mg | DELAYED_RELEASE_TABLET | Freq: Every day | ORAL | Status: DC
  Administered 2018-07-24 – 2018-07-25 (×2): 40 mg via ORAL
  Filled 2018-07-23 (×2): qty 1

## 2018-07-23 MED ORDER — INSULIN ASPART 100 UNIT/ML ~~LOC~~ SOLN
SUBCUTANEOUS | Status: AC
  Filled 2018-07-23: qty 1

## 2018-07-23 MED ORDER — IPRATROPIUM-ALBUTEROL 0.5-2.5 (3) MG/3ML IN SOLN
3.0000 mL | Freq: Four times a day (QID) | RESPIRATORY_TRACT | Status: DC
  Administered 2018-07-24 – 2018-07-25 (×5): 3 mL via RESPIRATORY_TRACT
  Filled 2018-07-23 (×4): qty 3

## 2018-07-23 MED ORDER — ALBUTEROL SULFATE (2.5 MG/3ML) 0.083% IN NEBU: 2.5000 mg | INHALATION_SOLUTION | Freq: Four times a day (QID) | RESPIRATORY_TRACT | Status: DC

## 2018-07-23 MED ORDER — LATANOPROST 0.005 % OP SOLN
1.0000 [drp] | Freq: Every day | OPHTHALMIC | Status: DC
  Filled 2018-07-23: qty 2.5

## 2018-07-23 MED ORDER — PENTAFLUOROPROP-TETRAFLUOROETH EX AERO: 1.0000 "application " | INHALATION_SPRAY | CUTANEOUS | Status: DC | PRN

## 2018-07-23 MED ORDER — ONDANSETRON HCL 4 MG PO TABS: 4.0000 mg | ORAL_TABLET | Freq: Four times a day (QID) | ORAL | Status: DC | PRN

## 2018-07-23 MED ORDER — CHLORHEXIDINE GLUCONATE CLOTH 2 % EX PADS
6.0000 | MEDICATED_PAD | Freq: Every day | CUTANEOUS | Status: DC
  Administered 2018-07-25: 6 via TOPICAL

## 2018-07-23 MED ORDER — DEXTROSE 50 % IV SOLN
INTRAVENOUS | Status: AC
  Filled 2018-07-23: qty 50

## 2018-07-23 MED ORDER — HEPARIN SODIUM (PORCINE) 5000 UNIT/ML IJ SOLN
5000.0000 [IU] | Freq: Three times a day (TID) | INTRAMUSCULAR | Status: DC
  Administered 2018-07-24 – 2018-07-25 (×4): 5000 [IU] via SUBCUTANEOUS
  Filled 2018-07-23 (×4): qty 1

## 2018-07-23 MED ORDER — IPRATROPIUM BROMIDE 0.02 % IN SOLN: 0.5000 mg | Freq: Four times a day (QID) | RESPIRATORY_TRACT | Status: DC

## 2018-07-23 MED ORDER — SODIUM CHLORIDE 0.9 % IV SOLN: 250.0000 mL | INTRAVENOUS | Status: DC | PRN

## 2018-07-23 MED ORDER — SODIUM CHLORIDE 0.9% FLUSH: 3.0000 mL | INTRAVENOUS | Status: DC | PRN

## 2018-07-23 MED ORDER — INSULIN ASPART 100 UNIT/ML IV SOLN
5.0000 [IU] | Freq: Once | INTRAVENOUS | Status: AC
  Administered 2018-07-23: 5 [IU] via INTRAVENOUS

## 2018-07-23 MED ORDER — CARVEDILOL 12.5 MG PO TABS
12.5000 mg | ORAL_TABLET | Freq: Two times a day (BID) | ORAL | Status: DC
  Administered 2018-07-24 – 2018-07-25 (×4): 12.5 mg via ORAL
  Filled 2018-07-23 (×4): qty 1

## 2018-07-23 NOTE — ED Triage Notes (Addendum)
Pt c/o chest tightness, congestion, cough, chills, sob that became worse yesterday after missing dialysis. Pt also reports that he had indigestion yesterday, felt better last night, was able to eat two slices of pizza and then became sick again during the night,
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
## 2018-07-23 NOTE — ED Notes (Signed)
CRITICAL VALUE ALERT  Critical Value:  Potassium - 7.0  Date & Time Notied:  07/23/18    1745  Provider Notified: Dr Eulis Foster  Orders Received/Actions taken:
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
## 2018-07-23 NOTE — ED Notes (Signed)
Pt's O2 86% on RA, placed pt on 2L/min via [place]., O2 95%

## 2018-07-23 NOTE — ED Notes (Signed)
Pt ambulatory from restroom to pt room. Pt O2 85% HR130 and RR 25.

## 2018-07-24 ENCOUNTER — Other Ambulatory Visit: Payer: Self-pay

## 2018-07-24 ENCOUNTER — Encounter (HOSPITAL_COMMUNITY): Payer: Self-pay | Admitting: Student

## 2018-07-24 ENCOUNTER — Observation Stay (HOSPITAL_BASED_OUTPATIENT_CLINIC_OR_DEPARTMENT_OTHER): Payer: Medicare Other

## 2018-07-24 DIAGNOSIS — J9601 Acute respiratory failure with hypoxia: Secondary | ICD-10-CM

## 2018-07-24 DIAGNOSIS — R0789 Other chest pain: Secondary | ICD-10-CM | POA: Diagnosis not present

## 2018-07-24 DIAGNOSIS — I10 Essential (primary) hypertension: Secondary | ICD-10-CM

## 2018-07-24 DIAGNOSIS — Z9114 Patient's other noncompliance with medication regimen: Secondary | ICD-10-CM | POA: Diagnosis not present

## 2018-07-24 DIAGNOSIS — I160 Hypertensive urgency: Secondary | ICD-10-CM | POA: Diagnosis not present

## 2018-07-24 DIAGNOSIS — R079 Chest pain, unspecified: Secondary | ICD-10-CM | POA: Diagnosis not present

## 2018-07-24 DIAGNOSIS — E875 Hyperkalemia: Secondary | ICD-10-CM | POA: Diagnosis not present

## 2018-07-24 DIAGNOSIS — J810 Acute pulmonary edema: Secondary | ICD-10-CM | POA: Diagnosis not present

## 2018-07-24 DIAGNOSIS — I739 Peripheral vascular disease, unspecified: Secondary | ICD-10-CM | POA: Diagnosis not present

## 2018-07-24 DIAGNOSIS — N186 End stage renal disease: Secondary | ICD-10-CM

## 2018-07-24 DIAGNOSIS — R072 Precordial pain: Secondary | ICD-10-CM | POA: Diagnosis not present

## 2018-07-24 DIAGNOSIS — Z992 Dependence on renal dialysis: Secondary | ICD-10-CM | POA: Diagnosis not present

## 2018-07-24 DIAGNOSIS — F17210 Nicotine dependence, cigarettes, uncomplicated: Secondary | ICD-10-CM | POA: Insufficient documentation

## 2018-07-24 DIAGNOSIS — D631 Anemia in chronic kidney disease: Secondary | ICD-10-CM | POA: Diagnosis not present

## 2018-07-24 DIAGNOSIS — I120 Hypertensive chronic kidney disease with stage 5 chronic kidney disease or end stage renal disease: Secondary | ICD-10-CM | POA: Diagnosis not present

## 2018-07-24 DIAGNOSIS — J449 Chronic obstructive pulmonary disease, unspecified: Secondary | ICD-10-CM | POA: Diagnosis not present

## 2018-07-24 DIAGNOSIS — K219 Gastro-esophageal reflux disease without esophagitis: Secondary | ICD-10-CM | POA: Diagnosis not present

## 2018-07-24 DIAGNOSIS — Z888 Allergy status to other drugs, medicaments and biological substances status: Secondary | ICD-10-CM | POA: Diagnosis not present

## 2018-07-24 DIAGNOSIS — Z9119 Patient's noncompliance with other medical treatment and regimen: Secondary | ICD-10-CM | POA: Diagnosis not present

## 2018-07-24 DIAGNOSIS — Z720 Tobacco use: Secondary | ICD-10-CM

## 2018-07-24 LAB — CBC
HCT: 30.4 % — ABNORMAL LOW (ref 39.0–52.0)
Hemoglobin: 9.5 g/dL — ABNORMAL LOW (ref 13.0–17.0)
MCH: 35.7 pg — ABNORMAL HIGH (ref 26.0–34.0)
MCHC: 31.3 g/dL (ref 30.0–36.0)
MCV: 114.3 fL — ABNORMAL HIGH (ref 80.0–100.0)
Platelets: 279 10*3/uL (ref 150–400)
RBC: 2.66 MIL/uL — ABNORMAL LOW (ref 4.22–5.81)
RDW: 14.5 % (ref 11.5–15.5)
WBC: 9.8 10*3/uL (ref 4.0–10.5)
nRBC: 0 % (ref 0.0–0.2)

## 2018-07-24 LAB — COMPREHENSIVE METABOLIC PANEL
ALT: 15 U/L (ref 0–44)
AST: 21 U/L (ref 15–41)
Albumin: 3 g/dL — ABNORMAL LOW (ref 3.5–5.0)
Alkaline Phosphatase: 79 U/L (ref 38–126)
Anion gap: 15 (ref 5–15)
BILIRUBIN TOTAL: 1.1 mg/dL (ref 0.3–1.2)
BUN: 81 mg/dL — AB (ref 6–20)
CO2: 14 mmol/L — ABNORMAL LOW (ref 22–32)
Calcium: 8.5 mg/dL — ABNORMAL LOW (ref 8.9–10.3)
Chloride: 106 mmol/L (ref 98–111)
Creatinine, Ser: 14.26 mg/dL — ABNORMAL HIGH (ref 0.61–1.24)
GFR calc Af Amer: 4 mL/min — ABNORMAL LOW (ref >60)
GFR calc non Af Amer: 3 mL/min — ABNORMAL LOW (ref >60)
GLUCOSE: 94 mg/dL (ref 70–99)
POTASSIUM: 5.9 mmol/L — AB (ref 3.5–5.1)
Sodium: 135 mmol/L (ref 135–145)
Total Protein: 6.8 g/dL (ref 6.5–8.1)

## 2018-07-24 LAB — TROPONIN I
Troponin I: 0.05 ng/mL (ref <0.03)
Troponin I: 0.05 ng/mL (ref <0.03)

## 2018-07-24 LAB — ECHOCARDIOGRAM COMPLETE
HEIGHTINCHES: 70 in
WEIGHTICAEL: 1904 [oz_av]

## 2018-07-24 MED ORDER — EPOETIN ALFA 4000 UNIT/ML IJ SOLN
3200.0000 [IU] | Freq: Once | INTRAMUSCULAR | Status: AC
  Administered 2018-07-24: 3200 [IU] via INTRAVENOUS
  Filled 2018-07-24: qty 1

## 2018-07-24 MED ORDER — NEPRO/CARBSTEADY PO LIQD
237.0000 mL | Freq: Two times a day (BID) | ORAL | Status: DC
  Administered 2018-07-25: 237 mL via ORAL

## 2018-07-24 MED ORDER — IPRATROPIUM-ALBUTEROL 0.5-2.5 (3) MG/3ML IN SOLN
RESPIRATORY_TRACT | Status: AC
  Filled 2018-07-24: qty 3

## 2018-07-24 MED ORDER — LABETALOL HCL 5 MG/ML IV SOLN: 10.0000 mg | Freq: Once | INTRAVENOUS | Status: DC

## 2018-07-24 MED ORDER — HYDRALAZINE HCL 20 MG/ML IJ SOLN
10.0000 mg | Freq: Four times a day (QID) | INTRAMUSCULAR | Status: DC | PRN
  Administered 2018-07-24: 10 mg via INTRAVENOUS
  Filled 2018-07-24: qty 1

## 2018-07-24 MED ORDER — DOXERCALCIFEROL 4 MCG/2ML IV SOLN
2.5000 ug | Freq: Once | INTRAVENOUS | Status: AC
  Administered 2018-07-24: 2.5 ug via INTRAVENOUS
  Filled 2018-07-24: qty 2

## 2018-07-24 MED ORDER — LATANOPROST 0.005 % OP SOLN
OPHTHALMIC | Status: AC
  Filled 2018-07-24: qty 2.5

## 2018-07-24 NOTE — Progress Notes (Signed)
*  PRELIMINARY RESULTS* Echocardiogram 2D Echocardiogram has been performed.  Luke Mueller 07/24/2018, 10:42 AM

## 2018-07-24 NOTE — Progress Notes (Signed)
PROGRESS NOTE  Luke Mueller:[id number] DOB: 11/08/1960 DOA: 07/23/2018 PCP: Rosita Fire, MD  Brief History:  58 year old male with a history of COPD, tobacco abuse, tension, PVD, ESRD (MWF) presenting with chest burning, shortness of breath, coughing.  The patient states that he has had chest burning for approximately 3 to 4 weeks.  Apparently, he was prescribed omeprazole by his PCP on 06/27/2018.  He states that he only took it a few days with some improvement.  Therefore he stopped taking it with recurrence of his chest burning.  He states that he restarted taking his omeprazole on 07/22/2018, but he continued to have chest burning and subsequently developed nausea and vomiting.  Because he felt so bad, he missed dialysis on 07/22/2018.  Because of his constellation of symptoms including nausea, vomiting, shortness of breath, and chest burning, he presented for further evaluation. Upon speaking with the patient, he frequently changes to details of his story/history as he continues to speak.  In general, it appears he has much confusion regarding his antihypertensive medications.  Furthermore, the patient has been poorly compliant with his fluid restriction.  Nevertheless, he blames confusing information provided by his healthcare providers. In the emergency department, the patient was afebrile hemodynamically stable with systolic blood pressures in the 200s.  The patient had oxygen sat desaturations to 85% with ambulation on room air.  Nephrology was consulted to assist with management.  Assessment/Plan: Acute respiratory failure with hypoxia -Secondary to pulmonary edema -Nephrology has been consulted -Dialysis planned 07/24/2018 a.m. -Presently stable on 4 L with saturation 95%  Malignant hypertension -As the patient continues to speak, it is somewhat evident that he has some noncompliance with his medications -Continue amlodipine, carvedilol, hydralazine  Chest  discomfort/chest burning -Obtain echocardiogram -Personally reviewed EKG--sinus rhythm with T wave inversion 1, 2, aVL -Consult cardiology-concerned about abnormal EKG with chest "burning" -Cycle troponins  ESRD -Hemodialysis Monday, Wednesday, Friday -Nephrology consulted  Tobacco abuse -Tobacco cessation discussed -I have discussed tobacco cessation with the patient.  I have counseled the patient regarding the negative impacts of continued tobacco use including but not limited to lung cancer, COPD, and cardiovascular disease.  I have discussed alternatives to tobacco and modalities that may help facilitate tobacco cessation including but not limited to biofeedback, hypnosis, and medications.  Total time spent with tobacco counseling was 4 minutes.  COPD -Appears stable without wheezing at this time -Continue duo nebs  Hyperkalemia -Patient was given [facility] -Dialysis planned 07/24/2018 -Keep on telemetry     Disposition Plan:   Home in 1-2 days  Family Communication:  No Family at bedside  Consultants:  Renal; cardiology  Code Status:  FULL  DVT Prophylaxis:  [place] Heparin   Procedures: As Listed in Progress Note Above  Antibiotics: None       Subjective: Patient continues to have some intermittent chest burning.  He has shortness of breath.  He continues have nonproductive cough.  He denies any vomiting but has some nausea.  He denies any fevers, chills, headache, neck pain.  Objective: Vitals:   07/24/18 0615 07/24/18 0630 07/24/18 0645 07/24/18 0700  BP:  (!) 213/97  (!) 221/101  Pulse: 97 100 (!) 119 (!) 107  Resp: (!) 21 (!) 22 (!) 21   Temp:      TempSrc:      SpO2: 93% 93% (!) 88% 92%  Weight:      Height:  No intake or output data in the 24 hours ending 07/24/18 0719 Weight change:  Exam:   General:  Pt is alert, follows commands appropriately, not in acute distress  HEENT: No icterus, No thrush, No neck mass, [place]/AT  Cardiovascular:  RRR, S1/S2, no rubs, no gallops  Respiratory: bilateral scattered rales.  No wheezing.  Abdomen: Soft/+BS, non tender, non distended, no guarding  Extremities: No edema, No lymphangitis, No petechiae, No rashes, no synovitis   Data Reviewed: I have personally reviewed following labs and imaging studies Basic Metabolic Panel: Recent Labs  Lab 07/23/18 1640 07/23/18 2131 07/24/18 0531  NA 136 136 135  K 7.0* 5.3* 5.9*  CL 105 112* 106  CO2 16*  --  14*  GLUCOSE 125* 91 94  BUN 78* 78* 81*  CREATININE 13.29* 15.40* 14.26*  CALCIUM 8.8*  --  8.5*   Liver Function Tests: Recent Labs  Lab 07/24/18 0531  AST 21  ALT 15  ALKPHOS 79  BILITOT 1.1  PROT 6.8  ALBUMIN 3.0*   No results for input(s): LIPASE, AMYLASE in the last 168 hours. No results for input(s): AMMONIA in the last 168 hours. Coagulation Profile: No results for input(s): INR, PROTIME in the last 168 hours. CBC: Recent Labs  Lab 07/23/18 1640 07/23/18 2131 07/24/18 0531  WBC 10.8*  --  9.8  HGB 9.8* 10.9* 9.5*  HCT 32.1* 32.0* 30.4*  MCV 114.2*  --  114.3*  PLT 332  --  279   Cardiac Enzymes: Recent Labs  Lab 07/23/18 1640  TROPONINI <0.03   BNP: Invalid input(s): POCBNP CBG: No results for input(s): GLUCAP in the last 168 hours. HbA1C: No results for input(s): HGBA1C in the last 72 hours. Urine analysis: No results found for: COLORURINE, APPEARANCEUR, LABSPEC, PHURINE, GLUCOSEU, HGBUR, BILIRUBINUR, KETONESUR, PROTEINUR, UROBILINOGEN, NITRITE, LEUKOCYTESUR Sepsis Labs: @LABRCNTIP (procalcitonin:4,lacticidven:4) )No results found for this or any previous visit (from the past 240 hour(s)).   Scheduled Meds: . amLODipine  10 mg Oral Daily  . carvedilol  12.5 mg Oral BID WC  . Chlorhexidine Gluconate Cloth  6 each Topical Q0600  . heparin  5,000 Units Subcutaneous Q8H  . hydrALAZINE  25 mg Oral TID  . ipratropium-albuterol  3 mL Nebulization Q6H  . ipratropium-albuterol      . latanoprost   1 drop Both Eyes QHS  . mometasone-formoterol  2 puff Inhalation BID  . pantoprazole  40 mg Oral Daily  . sodium chloride flush  3 mL Intravenous Q12H   Continuous Infusions: . sodium chloride 10 mL/hr at 07/23/18 2006  . sodium chloride      Procedures/Studies: Dg Chest 2 View  Result Date: 07/23/2018 CLINICAL DATA:  Heartburn began yesterday.  Shortness of breath. EXAM: CHEST - 2 VIEW COMPARISON:  03/06/2018 FINDINGS: Diffuse bilateral interstitial and patchy alveolar airspace opacities. Trace right pleural effusion. Lungs are hyperinflated likely secondary to COPD. No pneumothorax. Stable cardiomediastinal silhouette. No aggressive osseous lesion. IMPRESSION: 1. Diffuse bilateral interstitial and alveolar airspace opacities and a trace right pleural effusion. Differential considerations include mild pulmonary edema versus multilobar pneumonia. 2. COPD. Electronically Signed   By: Kathreen Devoid   On: 07/23/2018 16:45    Orson Eva, DO  [facility] Pager [phone]  If 7PM-7AM, please contact night-coverage [email or web address] Password [email or web address] 07/24/2018, 7:19 AM   LOS: 0 days

## 2018-07-24 NOTE — ED Notes (Signed)
CRITICAL VALUE ALERT  Critical Value:  Troponin 0.05  Date & Time Notied:  07/24/18 1438 Provider Notified: tat  Orders Received/Actions taken:

## 2018-07-24 NOTE — ED Notes (Signed)
CRITICAL VALUE ALERT  Critical Value:  Troponin 0.05  Date & Time Notied:  07/24/18 0813  Provider Notified: Dr. Carles Collet  Orders Received/Actions taken: MD notified
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
## 2018-07-24 NOTE — Care Management Obs Status (Signed)
[place] NOTIFICATION   Patient Details  Name: Luke Mueller MRN: [id number] Date of Birth: Oct 28, 1960   Medicare Observation Status Notification Given:  Yes    Sherald Barge, RN 07/24/2018, 11:23 AM

## 2018-07-24 NOTE — Procedures (Signed)
HEMODIALYSIS TREATMENT NOTE (Wed 15 Jan):  Unable to dialyze pt in ED due to size of assigned room.  Pt arrived to unit 3A at 1520.  4h heparin-free dialysis completed via right forearm AVF (15g/antegrade).  Prescribed Qb 400 tolerated with stable VP/AP.   Pre-treatment BP 203/103 despite administration of amlodipine, carvedilol, and hydralazine in ED at 0930.  Scheduled carvedilol 12.22m and hydralazine 280mpo (due at 1600 and 1700, respectively) were given during HD.  Pt became tachycardic 130s at 1715, approximately 1 hour and 40 minutes into HD session.  UF rate was decreased and HR decreased to 100s.    Goal met: 3.8 liters removed without interruption in ultrafiltration.  Lowest intradialytic BP was 151/87.  All blood was returned and hemostasis was achieved in 20 minutes.  Post tx BP 194/96; hydralazine 2568ms scheduled for 2200.  Post weight 53 kg (0.5kg below EDW).  [facility].  AngRockwell AlexandriaN
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
## 2018-07-25 ENCOUNTER — Other Ambulatory Visit: Payer: Self-pay

## 2018-07-25 DIAGNOSIS — R079 Chest pain, unspecified: Secondary | ICD-10-CM | POA: Diagnosis not present

## 2018-07-25 DIAGNOSIS — I739 Peripheral vascular disease, unspecified: Secondary | ICD-10-CM | POA: Diagnosis present

## 2018-07-25 DIAGNOSIS — Z888 Allergy status to other drugs, medicaments and biological substances status: Secondary | ICD-10-CM | POA: Diagnosis not present

## 2018-07-25 DIAGNOSIS — I10 Essential (primary) hypertension: Secondary | ICD-10-CM | POA: Diagnosis not present

## 2018-07-25 DIAGNOSIS — Z720 Tobacco use: Secondary | ICD-10-CM | POA: Diagnosis not present

## 2018-07-25 DIAGNOSIS — Z9114 Patient's other noncompliance with medication regimen: Secondary | ICD-10-CM | POA: Diagnosis not present

## 2018-07-25 DIAGNOSIS — Z992 Dependence on renal dialysis: Secondary | ICD-10-CM | POA: Diagnosis not present

## 2018-07-25 DIAGNOSIS — Z9119 Patient's noncompliance with other medical treatment and regimen: Secondary | ICD-10-CM | POA: Diagnosis not present

## 2018-07-25 DIAGNOSIS — I160 Hypertensive urgency: Secondary | ICD-10-CM | POA: Diagnosis present

## 2018-07-25 DIAGNOSIS — Z79899 Other long term (current) drug therapy: Secondary | ICD-10-CM | POA: Diagnosis not present

## 2018-07-25 DIAGNOSIS — J9601 Acute respiratory failure with hypoxia: Secondary | ICD-10-CM | POA: Diagnosis present

## 2018-07-25 DIAGNOSIS — I120 Hypertensive chronic kidney disease with stage 5 chronic kidney disease or end stage renal disease: Secondary | ICD-10-CM | POA: Diagnosis present

## 2018-07-25 DIAGNOSIS — E875 Hyperkalemia: Secondary | ICD-10-CM | POA: Diagnosis present

## 2018-07-25 DIAGNOSIS — Z7951 Long term (current) use of inhaled steroids: Secondary | ICD-10-CM | POA: Diagnosis not present

## 2018-07-25 DIAGNOSIS — N186 End stage renal disease: Secondary | ICD-10-CM | POA: Diagnosis present

## 2018-07-25 DIAGNOSIS — J810 Acute pulmonary edema: Secondary | ICD-10-CM | POA: Diagnosis present

## 2018-07-25 DIAGNOSIS — D631 Anemia in chronic kidney disease: Secondary | ICD-10-CM | POA: Diagnosis present

## 2018-07-25 DIAGNOSIS — K219 Gastro-esophageal reflux disease without esophagitis: Secondary | ICD-10-CM | POA: Diagnosis present

## 2018-07-25 DIAGNOSIS — F17210 Nicotine dependence, cigarettes, uncomplicated: Secondary | ICD-10-CM | POA: Diagnosis present

## 2018-07-25 DIAGNOSIS — J449 Chronic obstructive pulmonary disease, unspecified: Secondary | ICD-10-CM | POA: Diagnosis present

## 2018-07-25 LAB — BASIC METABOLIC PANEL
Anion gap: 13 (ref 5–15)
BUN: 28 mg/dL — ABNORMAL HIGH (ref 6–20)
CALCIUM: 8.5 mg/dL — AB (ref 8.9–10.3)
CO2: 26 mmol/L (ref 22–32)
Chloride: 95 mmol/L — ABNORMAL LOW (ref 98–111)
Creatinine, Ser: 8.33 mg/dL — ABNORMAL HIGH (ref 0.61–1.24)
GFR calc Af Amer: 7 mL/min — ABNORMAL LOW (ref >60)
GFR, EST NON AFRICAN AMERICAN: 6 mL/min — AB (ref >60)
Glucose, Bld: 171 mg/dL — ABNORMAL HIGH (ref 70–99)
Potassium: 3.6 mmol/L (ref 3.5–5.1)
SODIUM: 134 mmol/L — AB (ref 135–145)

## 2018-07-25 LAB — RAPID URINE DRUG SCREEN, HOSP PERFORMED
Amphetamines: NOT DETECTED
Barbiturates: NOT DETECTED
Benzodiazepines: NOT DETECTED
Cocaine: NOT DETECTED
Opiates: NOT DETECTED
Tetrahydrocannabinol: NOT DETECTED

## 2018-07-25 LAB — SODIUM, URINE, RANDOM: Sodium, Ur: 101 mmol/L

## 2018-07-25 LAB — HIV ANTIBODY (ROUTINE TESTING W REFLEX): HIV Screen 4th Generation wRfx: NONREACTIVE

## 2018-07-25 MED ORDER — CARVEDILOL 25 MG PO TABS: 25.0000 mg | ORAL_TABLET | Freq: Two times a day (BID) | ORAL | 1 refills | Status: DC

## 2018-07-25 MED ORDER — CARVEDILOL 12.5 MG PO TABS: 25.0000 mg | ORAL_TABLET | Freq: Two times a day (BID) | ORAL | Status: DC

## 2018-07-25 NOTE — Discharge Summary (Signed)
Physician Discharge Summary  EIAN Luke Mueller WIO:[id number] DOB: 08-12-1960 DOA: 07/23/2018  PCP: Rosita Fire, MD  Admit date: 07/23/2018 Discharge date: 07/25/2018  Admitted From: Home Disposition:  Home   Recommendations for Outpatient Follow-up:  1. Follow up with PCP in 1-2 weeks   Discharge Condition: Stable CODE STATUS: FULL Diet recommendation: Heart Healthy   Brief/Interim Summary: 58 year old male with a history of COPD, tobacco abuse, tension, PVD, ESRD (MWF) presenting with chest burning, shortness of breath, coughing.  The patient states that he has had chest burning for approximately 3 to 4 weeks.  Apparently, he was prescribed omeprazole by his PCP on 06/27/2018.  He states that he only took it a few days with some improvement.  Therefore he stopped taking it with recurrence of his chest burning.  He states that he restarted taking his omeprazole on 07/22/2018, but he continued to have chest burning and subsequently developed nausea and vomiting.  Because he felt so bad, he missed dialysis on 07/22/2018.  Because of his constellation of symptoms including nausea, vomiting, shortness of breath, and chest burning, he presented for further evaluation. Upon speaking with the patient, he frequently changes to details of his story/history as he continues to speak.  In general, it appears he has much confusion regarding his antihypertensive medications.  Furthermore, the patient has been poorly compliant with his fluid restriction.  Nevertheless, he blames confusing information provided by his healthcare providers. In the emergency department, the patient was afebrile hemodynamically stable with systolic blood pressures in the 200s.  The patient had oxygen sat desaturations to 85% with ambulation on room air.  Nephrology was consulted to assist with management.   Discharge Diagnoses:  Acute respiratory failure with hypoxia -Secondary to pulmonary edema -Nephrology has been  consulted -Dialysis planned 07/24/2018 a.m. -Presently stable on 4 L with saturation 95% -Patient was dialyzed 07/24/2018 and subsequently weaned to room air  Malignant hypertension -As the patient continues to speak, it is somewhat evident that he has noncompliance with his medications -Continue amlodipine, carvedilol, hydralazine -Blood pressure much improved during hospitalization with systolic blood pressure in the 160s -Increase carvedilol to 25 mg twice daily  Chest discomfort/chest burning/elevated troponin -07/24/2018 echo EF 55-60%, no WMA, grade 1 DD, trivial MR/TR -Chest discomfort somewhat atypical by clinical history, but the patient has dynamic EKG changes concerning for ischemia -Personally reviewed EKG--sinus rhythm with T wave inversion 1, 2, aVL -Consult cardiology-concerned about abnormal EKG with chest "burning"  -Appreciate cardiology consultation--troponins flat with unremarkable echo.  Continue to optimize blood pressure and treat medically.  No further testing needed at this time. -Cycle troponins--trend is flat--due to demand ischemia  ESRD -Hemodialysis Monday, Wednesday, Friday -Nephrology consulted  Tobacco abuse -Tobacco cessation discussed -I have discussed tobacco cessation with the patient.  I have counseled the patient regarding the negative impacts of continued tobacco use including but not limited to lung cancer, COPD, and cardiovascular disease.  I have discussed alternatives to tobacco and modalities that may help facilitate tobacco cessation including but not limited to biofeedback, hypnosis, and medications.  Total time spent with tobacco counseling was 4 minutes.  COPD -Appears stable without wheezing at this time -Continue duo nebs  Hyperkalemia -Patient was given [facility] -Dialysis planned 07/24/2018 -Keep on telemetry  Discharge Instructions   Allergies as of 07/25/2018      Reactions   Lotrel [amlodipine Besy-benazepril Hcl]  Swelling   Lips swelling      Medication List    TAKE these medications  albuterol 108 (90 Base) MCG/ACT inhaler Commonly known as:  PROVENTIL HFA;VENTOLIN HFA Inhale 2 puffs into the lungs every 6 (six) hours as needed for wheezing or shortness of breath.   amLODipine 10 MG tablet Commonly known as:  NORVASC Take 10 mg by mouth daily.   budesonide-formoterol 160-4.5 MCG/ACT inhaler Commonly known as:  SYMBICORT Inhale 2 puffs into the lungs 2 (two) times daily.   carvedilol 25 MG tablet Commonly known as:  COREG Take 1 tablet (25 mg total) by mouth 2 (two) times daily with a meal. What changed:    medication strength  how much to take   cyclobenzaprine 5 MG tablet Commonly known as:  FLEXERIL Take 5 mg by mouth 2 (two) times daily as needed for muscle spasms.   hydrALAZINE 25 MG tablet Commonly known as:  APRESOLINE Take 25 mg by mouth 3 (three) times daily.   latanoprost 0.005 % ophthalmic solution Commonly known as:  XALATAN Place 1 drop into both eyes at bedtime.   multivitamin Tabs tablet Take 1 tablet by mouth daily.   omeprazole 20 MG capsule Commonly known as:  PRILOSEC Take 20 mg by mouth daily.       Allergies  Allergen Reactions  . Lotrel [Amlodipine Besy-Benazepril Hcl] Swelling    Lips swelling    Consultations:  Cardiology  -Nephrology   Procedures/Studies: Dg Chest 2 View  Result Date: 07/23/2018 CLINICAL DATA:  Heartburn began yesterday.  Shortness of breath. EXAM: CHEST - 2 VIEW COMPARISON:  03/06/2018 FINDINGS: Diffuse bilateral interstitial and patchy alveolar airspace opacities. Trace right pleural effusion. Lungs are hyperinflated likely secondary to COPD. No pneumothorax. Stable cardiomediastinal silhouette. No aggressive osseous lesion. IMPRESSION: 1. Diffuse bilateral interstitial and alveolar airspace opacities and a trace right pleural effusion. Differential considerations include mild pulmonary edema versus multilobar  pneumonia. 2. COPD. Electronically Signed   By: Kathreen Devoid   On: 07/23/2018 16:45         Discharge Exam: Vitals:   07/25/18 0748 07/25/18 0755  BP:    Pulse:    Resp:    Temp:    SpO2: 97% 97%   Vitals:   07/24/18 2115 07/25/18 0649 07/25/18 0748 07/25/18 0755  BP: (!) 182/86 (!) 156/82    Pulse: (!) 108 (!) 110    Resp: 19 16    Temp: 98.6 F (37 C) 98.7 F (37.1 C)    TempSrc: Oral Oral    SpO2: 99% 98% 97% 97%  Weight:      Height:        General: Pt is alert, awake, not in acute distress Cardiovascular: RRR, S1/S2 +, no rubs, no gallops Respiratory: CTA bilaterally, no wheezing, no rhonchi Abdominal: Soft, NT, ND, bowel sounds + Extremities: no edema, no cyanosis   The results of significant diagnostics from this hospitalization (including imaging, microbiology, ancillary and laboratory) are listed below for reference.    Significant Diagnostic Studies: Dg Chest 2 View  Result Date: 07/23/2018 CLINICAL DATA:  Heartburn began yesterday.  Shortness of breath. EXAM: CHEST - 2 VIEW COMPARISON:  03/06/2018 FINDINGS: Diffuse bilateral interstitial and patchy alveolar airspace opacities. Trace right pleural effusion. Lungs are hyperinflated likely secondary to COPD. No pneumothorax. Stable cardiomediastinal silhouette. No aggressive osseous lesion. IMPRESSION: 1. Diffuse bilateral interstitial and alveolar airspace opacities and a trace right pleural effusion. Differential considerations include mild pulmonary edema versus multilobar pneumonia. 2. COPD. Electronically Signed   By: Kathreen Devoid   On: 07/23/2018 16:45  Microbiology: No results found for this or any previous visit (from the past 240 hour(s)).   Labs: Basic Metabolic Panel: Recent Labs  Lab 07/23/18 1640 07/23/18 2131 07/24/18 0531 07/25/18 1055  NA 136 136 135 134*  K 7.0* 5.3* 5.9* 3.6  CL 105 112* 106 95*  CO2 16*  --  14* 26  GLUCOSE 125* 91 94 171*  BUN 78* 78* 81* 28*  CREATININE  13.29* 15.40* 14.26* 8.33*  CALCIUM 8.8*  --  8.5* 8.5*   Liver Function Tests: Recent Labs  Lab 07/24/18 0531  AST 21  ALT 15  ALKPHOS 79  BILITOT 1.1  PROT 6.8  ALBUMIN 3.0*   No results for input(s): LIPASE, AMYLASE in the last 168 hours. No results for input(s): AMMONIA in the last 168 hours. CBC: Recent Labs  Lab 07/23/18 1640 07/23/18 2131 07/24/18 0531  WBC 10.8*  --  9.8  HGB 9.8* 10.9* 9.5*  HCT 32.1* 32.0* 30.4*  MCV 114.2*  --  114.3*  PLT 332  --  279   Cardiac Enzymes: Recent Labs  Lab 07/23/18 1640 07/24/18 0531 07/24/18 1317  TROPONINI <0.03 0.05* 0.05*   BNP: Invalid input(s): POCBNP CBG: No results for input(s): GLUCAP in the last 168 hours.  Time coordinating discharge:  36 minutes  Signed:  Orson Eva, DO [facility] Pager: [phone] 07/25/2018, 12:24 PM

## 2018-07-25 NOTE — Progress Notes (Addendum)
Progress Note  Patient Name: Luke Mueller Date of Encounter: 07/25/2018  Primary Cardiologist: Evaluated by Dr. Debara Pickett in 2014 --> no Cardiology follow-up since  Subjective   He denies any recurrent chest pain overnight or this morning. Says his breathing improved following HD yesterday evening. Only concern at this time is a lingering headache present since admission but improving.    Inpatient Medications    Scheduled Meds: . amLODipine  10 mg Oral Daily  . carvedilol  12.5 mg Oral BID WC  . Chlorhexidine Gluconate Cloth  6 each Topical Q0600  . feeding supplement (NEPRO CARB STEADY)  237 mL Oral BID BM  . heparin  5,000 Units Subcutaneous Q8H  . hydrALAZINE  25 mg Oral TID  . ipratropium-albuterol  3 mL Nebulization Q6H  . labetalol  10 mg Intravenous Once  . latanoprost  1 drop Both Eyes QHS  . mometasone-formoterol  2 puff Inhalation BID  . pantoprazole  40 mg Oral Daily  . sodium chloride flush  3 mL Intravenous Q12H   Continuous Infusions: . sodium chloride 10 mL/hr at 07/24/18 0747  . sodium chloride     PRN Meds: sodium chloride, albuterol, cyclobenzaprine, hydrALAZINE, lidocaine (PF), lidocaine-prilocaine, ondansetron **OR** ondansetron (ZOFRAN) IV, oxyCODONE-acetaminophen, pentafluoroprop-tetrafluoroeth, sodium chloride flush   Vital Signs    Vitals:   07/24/18 2115 07/25/18 0649 07/25/18 0748 07/25/18 0755  BP: (!) 182/86 (!) 156/82    Pulse: (!) 108 (!) 110    Resp: 19 16    Temp: 98.6 F (37 C) 98.7 F (37.1 C)    TempSrc: Oral Oral    SpO2: 99% 98% 97% 97%  Weight:      Height:        Intake/Output Summary (Last 24 hours) at 07/25/2018 0902 Last data filed at 07/24/2018 1940 Gross per 24 hour  Intake 204.83 ml  Output 3817 ml  Net -3612.17 ml   Filed Weights   07/24/18 1530 07/24/18 1630 07/24/18 2000  Weight: 56.8 kg 56.8 kg 53 kg    Telemetry    Sinus tachycardia, HR in low-100's to 120's with PVC's.  - Personally Reviewed  ECG      Sinus tachycardia, HR 104, with LAFB, LVH, and improvement of T-wave abnormalities when compared to prior tracings.  - Personally Reviewed   Physical Exam   General: Well developed, well nourished African American male appearing in no acute distress. Head: Normocephalic, atraumatic.  Neck: Supple without bruits, JVD at 8cm. Lungs:  Resp regular and unlabored, mild expiratory wheeze along upper lung fields. Heart: Regular rhythm, tachycardiac rate, S1, S2, no S3, S4, or murmur; no rub. Abdomen: Soft, non-tender, non-distended with normoactive bowel sounds. No hepatomegaly. No rebound/guarding. No obvious abdominal masses. Extremities: No clubbing, cyanosis, or lower extremity edema. Distal pedal pulses are 2+ bilaterally. Neuro: Alert and oriented X 3. Moves all extremities spontaneously. Psych: Normal affect.  Labs    Chemistry Recent Labs  Lab 07/23/18 1640 07/23/18 2131 07/24/18 0531  NA 136 136 135  K 7.0* 5.3* 5.9*  CL 105 112* 106  CO2 16*  --  14*  GLUCOSE 125* 91 94  BUN 78* 78* 81*  CREATININE 13.29* 15.40* 14.26*  CALCIUM 8.8*  --  8.5*  PROT  --   --  6.8  ALBUMIN  --   --  3.0*  AST  --   --  21  ALT  --   --  15  ALKPHOS  --   --  65  BILITOT  --   --  1.1  GFRNONAA 4*  --  3*  GFRAA 4*  --  4*  ANIONGAP 15  --  15     Hematology Recent Labs  Lab 07/23/18 1640 07/23/18 2131 07/24/18 0531  WBC 10.8*  --  9.8  RBC 2.81*  --  2.66*  HGB 9.8* 10.9* 9.5*  HCT 32.1* 32.0* 30.4*  MCV 114.2*  --  114.3*  MCH 34.9*  --  35.7*  MCHC 30.5  --  31.3  RDW 14.8  --  14.5  PLT 332  --  279    Cardiac Enzymes Recent Labs  Lab 07/23/18 1640 07/24/18 0531 07/24/18 1317  TROPONINI <0.03 0.05* 0.05*   No results for input(s): TROPIPOC in the last 168 hours.   BNPNo results for input(s): BNP, PROBNP in the last 168 hours.   DDimer No results for input(s): DDIMER in the last 168 hours.   Radiology    Dg Chest 2 View  Result Date: 07/23/2018 CLINICAL  DATA:  Heartburn began yesterday.  Shortness of breath. EXAM: CHEST - 2 VIEW COMPARISON:  03/06/2018 FINDINGS: Diffuse bilateral interstitial and patchy alveolar airspace opacities. Trace right pleural effusion. Lungs are hyperinflated likely secondary to COPD. No pneumothorax. Stable cardiomediastinal silhouette. No aggressive osseous lesion. IMPRESSION: 1. Diffuse bilateral interstitial and alveolar airspace opacities and a trace right pleural effusion. Differential considerations include mild pulmonary edema versus multilobar pneumonia. 2. COPD. Electronically Signed   By: Kathreen Devoid   On: 07/23/2018 16:45    Cardiac Studies   Echocardiogram: 07/24/2018 Study Conclusions  - Left ventricle: The cavity size was normal. Wall thickness was   increased in a pattern of moderate LVH. Systolic function was   normal. The estimated ejection fraction was in the range of 55%   to 60%. Although no diagnostic regional wall motion abnormality   was identified, this possibility cannot be completely excluded on   the basis of this study. Doppler parameters are consistent with   abnormal left ventricular relaxation (grade 1 diastolic   dysfunction). - Aortic valve: Mildly calcified annulus. Trileaflet. - Mitral valve: Mildly thickened leaflets. There was trivial   regurgitation. - Right atrium: Central venous pressure (est): 3 mm Hg. - Atrial septum: No defect or patent foramen ovale was identified. - Tricuspid valve: There was trivial regurgitation. - Pulmonary arteries: Systolic pressure could not be accurately   estimated. - Pericardium, extracardiac: There was no pericardial effusion.  Patient Profile     58 y.o. male w/ PMH of HTN, GERD, COPD, ESRD (on HD - MWF), and tobacco use who presented to [facility] ED on 07/24/2018 for evaluation of chest pain. Found to have hypertensive urgency with BP at 204/101 upon arrival and K+ at 7.0.  Assessment & Plan    1. Atypical Chest Pain/ Abnormal  EKG - he reports having episodes of chest pain for the past few months which were frequently associated with food consumption. Pain occurring prior to admission was persistent for two days and not exacerbated with exertion.  - EKG's upon admission showed variable T-wave changes in the setting of hyperkalemia. Repeat this morning shows TWI has significantly improved when compared to prior tracings. Cyclic troponin values have remained flat at 0.05. Echocardiogram this admission shows a preserved EF of 55-60% with no regional WMA noted and Grade 1 DD. Was noted to have moderate LVH and trivial TR.  - given resolution of his chest pain, having ruled-out for ACS,  and echo showing no acute findings, would not anticipate further cardiac testing this admission.   2. Volume Overload/ ESRD - in the setting of missing HD on Monday. Underwent HD yesterday with -3.8L removed.  - per Nephrology.   3. Hypertensive Urgency - BP elevated to 204/101 upon arrival, with readings remaining elevated and requiring administration of IV Hydralazine yesterday. BP improved to 156/82 on most recent check.  - currently on Coreg 12.5mg  BID, Hydralazine 25mg  TID, and Amlodipine 10mg  daily. Given persistent elevated readings and tachycardia, consider further titration of Coreg to 25mg  BID if readings remain elevated in the ambulatory setting (was noncompliant with medications PTA).   4. Hyperkalemia - K+ 7.0 on admission, at 5.9 on 1/15. No repeat BMET obtained for today. Will order now.   5. COPD/ Tobacco Use - reports his respiratory status has improved since undergoing HD yesterday. Smokes 0.5 ppd as an outpatient.   For questions or updates, please contact [place] Please consult [email or web address] for contact info under Cardiology/STEMI.   Arna Medici , PA-C 9:02 AM 07/25/2018 Pager: [phone]   Attending note:  Reviewed overnight course and discussed case with Ms. Delano Metz.  Mr.  Rennert reports no recurrent chest pain and troponin I levels are low-level abnormal and flat, not suggestive of ACS.  Follow-up echocardiogram shows LVEF 55 to 60% range.  Fluid status also improved following hemodialysis session and his blood pressure trend has come down as well.  Would recommend medical therapy without further ischemic work-up at this time.  He is currently on Coreg, hydralazine, and Norvasc.  Noncompliance remains a problem however, would titrate medical therapy with nephrologist and PCP as an outpatient.  [facility] will sign off.   Medication Recommendations: Norvasc 10 mg daily, Coreg 12.5 mg twice daily, hydralazine 25 mg 3 times daily Other recommendations (labs, testing, etc): No further cardiac testing planned. Follow up as an outpatient: Keep follow-up with PCP and nephrologist.  Satira Sark, M.D., F.A.C.C.

## 2018-07-25 NOTE — Progress Notes (Signed)
  [facility] Progress Note   Assessment/ Plan:   72M ESRD presenting with heartburn and found to have hypertensive emergency, hyperkalemia, pulmonary edema/hypoxia in the context of missed dialysis on 1/13.  1. ESRD, MWF, right forearm AV fistula, [facility] on [address] outpatient unit- much improved from yesterday,  2. Hyperkalemia secondary to missed HD treatment, improved with Lokelma and resolved with dialysis 3. Pulmonary edema, hypervolemia, hypoxia secondary to missed dialysis-- resolved, on RA now 4. Heartburn type symptoms, troponins not significantly elevated, symptoms largely improved, was not taking PPI on a daily basis 5. Hypertension, emergent secondary to medication nonadherence and missed dialysis with pulmonary symptoms-- cardiology following, 6. Anemia, hemoglobin around 10, mild 7. Tobacco user 8. Dispo: OK to go from renal perspective since K and vol improved and can resume OP schedule at HD tomorrow   Subjective:    Seen in room. S/p HD with 3.L UF off.  Feeling much better.     Objective:   BP (!) 156/82   Pulse (!) 110   Temp 98.7 F (37.1 C) (Oral)   Resp 16   Ht 5\' 10"  (1.778 m)   Wt 53 kg   SpO2 97%   BMI 16.77 kg/m   Physical Exam: Gen: NAD, lying in bed CVS: RRR no m/r/g Resp:normal WOB, some faint R-sided rhonchi which resolve with coughing Abd: soft, nontender, NABS Ext: no LE edema  Labs: BMET Recent Labs  Lab 07/23/18 1640 07/23/18 2131 07/24/18 0531  NA 136 136 135  K 7.0* 5.3* 5.9*  CL 105 112* 106  CO2 16*  --  14*  GLUCOSE 125* 91 94  BUN 78* 78* 81*  CREATININE 13.29* 15.40* 14.26*  CALCIUM 8.8*  --  8.5*   CBC Recent Labs  Lab 07/23/18 1640 07/23/18 2131 07/24/18 0531  WBC 10.8*  --  9.8  HGB 9.8* 10.9* 9.5*  HCT 32.1* 32.0* 30.4*  MCV 114.2*  --  114.3*  PLT 332  --  279    @IMGRELPRIORS @ Medications:    . amLODipine  10 mg Oral Daily  . carvedilol  12.5 mg Oral BID WC  . Chlorhexidine  Gluconate Cloth  6 each Topical Q0600  . feeding supplement (NEPRO CARB STEADY)  237 mL Oral BID BM  . heparin  5,000 Units Subcutaneous Q8H  . hydrALAZINE  25 mg Oral TID  . ipratropium-albuterol  3 mL Nebulization Q6H  . labetalol  10 mg Intravenous Once  . latanoprost  1 drop Both Eyes QHS  . mometasone-formoterol  2 puff Inhalation BID  . pantoprazole  40 mg Oral Daily  . sodium chloride flush  3 mL Intravenous Q12H     Madelon Lips, MD 07/25/2018, 11:19 AM

## 2018-07-26 DIAGNOSIS — Z992 Dependence on renal dialysis: Secondary | ICD-10-CM | POA: Diagnosis not present

## 2018-07-26 DIAGNOSIS — N186 End stage renal disease: Secondary | ICD-10-CM | POA: Diagnosis not present

## 2018-07-26 DIAGNOSIS — D631 Anemia in chronic kidney disease: Secondary | ICD-10-CM | POA: Diagnosis not present

## 2018-07-26 DIAGNOSIS — D509 Iron deficiency anemia, unspecified: Secondary | ICD-10-CM | POA: Diagnosis not present

## 2018-07-26 DIAGNOSIS — N2581 Secondary hyperparathyroidism of renal origin: Secondary | ICD-10-CM | POA: Diagnosis not present

## 2018-07-29 DIAGNOSIS — N186 End stage renal disease: Secondary | ICD-10-CM | POA: Diagnosis not present

## 2018-07-29 DIAGNOSIS — N2581 Secondary hyperparathyroidism of renal origin: Secondary | ICD-10-CM | POA: Diagnosis not present

## 2018-07-29 DIAGNOSIS — D631 Anemia in chronic kidney disease: Secondary | ICD-10-CM | POA: Diagnosis not present

## 2018-07-29 DIAGNOSIS — D509 Iron deficiency anemia, unspecified: Secondary | ICD-10-CM | POA: Diagnosis not present

## 2018-07-29 DIAGNOSIS — Z992 Dependence on renal dialysis: Secondary | ICD-10-CM | POA: Diagnosis not present

## 2018-07-31 DIAGNOSIS — N2581 Secondary hyperparathyroidism of renal origin: Secondary | ICD-10-CM | POA: Diagnosis not present

## 2018-07-31 DIAGNOSIS — N186 End stage renal disease: Secondary | ICD-10-CM | POA: Diagnosis not present

## 2018-07-31 DIAGNOSIS — Z992 Dependence on renal dialysis: Secondary | ICD-10-CM | POA: Diagnosis not present

## 2018-07-31 DIAGNOSIS — Z79899 Other long term (current) drug therapy: Secondary | ICD-10-CM | POA: Diagnosis not present

## 2018-07-31 DIAGNOSIS — D631 Anemia in chronic kidney disease: Secondary | ICD-10-CM | POA: Diagnosis not present

## 2018-07-31 DIAGNOSIS — D509 Iron deficiency anemia, unspecified: Secondary | ICD-10-CM | POA: Diagnosis not present

## 2018-08-02 DIAGNOSIS — N186 End stage renal disease: Secondary | ICD-10-CM | POA: Diagnosis not present

## 2018-08-02 DIAGNOSIS — D509 Iron deficiency anemia, unspecified: Secondary | ICD-10-CM | POA: Diagnosis not present

## 2018-08-02 DIAGNOSIS — N2581 Secondary hyperparathyroidism of renal origin: Secondary | ICD-10-CM | POA: Diagnosis not present

## 2018-08-02 DIAGNOSIS — Z992 Dependence on renal dialysis: Secondary | ICD-10-CM | POA: Diagnosis not present

## 2018-08-02 DIAGNOSIS — D631 Anemia in chronic kidney disease: Secondary | ICD-10-CM | POA: Diagnosis not present

## 2018-08-05 DIAGNOSIS — D509 Iron deficiency anemia, unspecified: Secondary | ICD-10-CM | POA: Diagnosis not present

## 2018-08-05 DIAGNOSIS — N186 End stage renal disease: Secondary | ICD-10-CM | POA: Diagnosis not present

## 2018-08-05 DIAGNOSIS — Z992 Dependence on renal dialysis: Secondary | ICD-10-CM | POA: Diagnosis not present

## 2018-08-05 DIAGNOSIS — D631 Anemia in chronic kidney disease: Secondary | ICD-10-CM | POA: Diagnosis not present

## 2018-08-05 DIAGNOSIS — N2581 Secondary hyperparathyroidism of renal origin: Secondary | ICD-10-CM | POA: Diagnosis not present

## 2018-08-07 DIAGNOSIS — Z992 Dependence on renal dialysis: Secondary | ICD-10-CM | POA: Diagnosis not present

## 2018-08-07 DIAGNOSIS — D509 Iron deficiency anemia, unspecified: Secondary | ICD-10-CM | POA: Diagnosis not present

## 2018-08-07 DIAGNOSIS — N186 End stage renal disease: Secondary | ICD-10-CM | POA: Diagnosis not present

## 2018-08-07 DIAGNOSIS — N2581 Secondary hyperparathyroidism of renal origin: Secondary | ICD-10-CM | POA: Diagnosis not present

## 2018-08-07 DIAGNOSIS — D631 Anemia in chronic kidney disease: Secondary | ICD-10-CM | POA: Diagnosis not present

## 2018-08-08 ENCOUNTER — Ambulatory Visit (HOSPITAL_COMMUNITY)
Admission: RE | Admit: 2018-08-08 | Discharge: 2018-08-08 | Disposition: A | Discharge status: Home / Self Care | Payer: Medicare Other | Source: Ambulatory Visit | Attending: Pulmonary Disease | Admitting: Pulmonary Disease

## 2018-08-08 ENCOUNTER — Other Ambulatory Visit (HOSPITAL_COMMUNITY): Payer: Self-pay | Admitting: Pulmonary Disease

## 2018-08-08 DIAGNOSIS — J984 Other disorders of lung: Secondary | ICD-10-CM | POA: Diagnosis not present

## 2018-08-08 DIAGNOSIS — J42 Unspecified chronic bronchitis: Secondary | ICD-10-CM

## 2018-08-09 DIAGNOSIS — Z992 Dependence on renal dialysis: Secondary | ICD-10-CM | POA: Diagnosis not present

## 2018-08-09 DIAGNOSIS — D631 Anemia in chronic kidney disease: Secondary | ICD-10-CM | POA: Diagnosis not present

## 2018-08-09 DIAGNOSIS — D509 Iron deficiency anemia, unspecified: Secondary | ICD-10-CM | POA: Diagnosis not present

## 2018-08-09 DIAGNOSIS — N2581 Secondary hyperparathyroidism of renal origin: Secondary | ICD-10-CM | POA: Diagnosis not present

## 2018-08-09 DIAGNOSIS — N186 End stage renal disease: Secondary | ICD-10-CM | POA: Diagnosis not present

## 2018-08-12 DIAGNOSIS — Z992 Dependence on renal dialysis: Secondary | ICD-10-CM | POA: Diagnosis not present

## 2018-08-12 DIAGNOSIS — N2581 Secondary hyperparathyroidism of renal origin: Secondary | ICD-10-CM | POA: Diagnosis not present

## 2018-08-12 DIAGNOSIS — N186 End stage renal disease: Secondary | ICD-10-CM | POA: Diagnosis not present

## 2018-08-12 DIAGNOSIS — D631 Anemia in chronic kidney disease: Secondary | ICD-10-CM | POA: Diagnosis not present

## 2018-08-12 DIAGNOSIS — D509 Iron deficiency anemia, unspecified: Secondary | ICD-10-CM | POA: Diagnosis not present

## 2018-08-14 DIAGNOSIS — Z992 Dependence on renal dialysis: Secondary | ICD-10-CM | POA: Diagnosis not present

## 2018-08-14 DIAGNOSIS — D509 Iron deficiency anemia, unspecified: Secondary | ICD-10-CM | POA: Diagnosis not present

## 2018-08-14 DIAGNOSIS — N2581 Secondary hyperparathyroidism of renal origin: Secondary | ICD-10-CM | POA: Diagnosis not present

## 2018-08-14 DIAGNOSIS — N186 End stage renal disease: Secondary | ICD-10-CM | POA: Diagnosis not present

## 2018-08-14 DIAGNOSIS — D631 Anemia in chronic kidney disease: Secondary | ICD-10-CM | POA: Diagnosis not present

## 2018-08-16 DIAGNOSIS — N186 End stage renal disease: Secondary | ICD-10-CM | POA: Diagnosis not present

## 2018-08-16 DIAGNOSIS — Z992 Dependence on renal dialysis: Secondary | ICD-10-CM | POA: Diagnosis not present

## 2018-08-16 DIAGNOSIS — D509 Iron deficiency anemia, unspecified: Secondary | ICD-10-CM | POA: Diagnosis not present

## 2018-08-16 DIAGNOSIS — D631 Anemia in chronic kidney disease: Secondary | ICD-10-CM | POA: Diagnosis not present

## 2018-08-16 DIAGNOSIS — N2581 Secondary hyperparathyroidism of renal origin: Secondary | ICD-10-CM | POA: Diagnosis not present

## 2018-08-21 DIAGNOSIS — Z992 Dependence on renal dialysis: Secondary | ICD-10-CM | POA: Diagnosis not present

## 2018-08-21 DIAGNOSIS — N2581 Secondary hyperparathyroidism of renal origin: Secondary | ICD-10-CM | POA: Diagnosis not present

## 2018-08-21 DIAGNOSIS — N186 End stage renal disease: Secondary | ICD-10-CM | POA: Diagnosis not present

## 2018-08-21 DIAGNOSIS — D631 Anemia in chronic kidney disease: Secondary | ICD-10-CM | POA: Diagnosis not present

## 2018-08-21 DIAGNOSIS — D509 Iron deficiency anemia, unspecified: Secondary | ICD-10-CM | POA: Diagnosis not present

## 2018-08-23 ENCOUNTER — Emergency Department (HOSPITAL_COMMUNITY): Payer: Medicare Other

## 2018-08-23 ENCOUNTER — Emergency Department (HOSPITAL_COMMUNITY)
Admission: EM | Admit: 2018-08-23 | Discharge: 2018-08-23 | Disposition: A | Discharge status: Home / Self Care | Payer: Medicare Other | Attending: Emergency Medicine | Admitting: Emergency Medicine

## 2018-08-23 ENCOUNTER — Encounter (HOSPITAL_COMMUNITY): Payer: Self-pay | Admitting: Emergency Medicine

## 2018-08-23 ENCOUNTER — Other Ambulatory Visit: Payer: Self-pay

## 2018-08-23 DIAGNOSIS — J111 Influenza due to unidentified influenza virus with other respiratory manifestations: Secondary | ICD-10-CM

## 2018-08-23 DIAGNOSIS — D509 Iron deficiency anemia, unspecified: Secondary | ICD-10-CM | POA: Diagnosis not present

## 2018-08-23 DIAGNOSIS — J449 Chronic obstructive pulmonary disease, unspecified: Secondary | ICD-10-CM | POA: Insufficient documentation

## 2018-08-23 DIAGNOSIS — F17210 Nicotine dependence, cigarettes, uncomplicated: Secondary | ICD-10-CM | POA: Insufficient documentation

## 2018-08-23 DIAGNOSIS — R509 Fever, unspecified: Secondary | ICD-10-CM | POA: Diagnosis present

## 2018-08-23 DIAGNOSIS — N2581 Secondary hyperparathyroidism of renal origin: Secondary | ICD-10-CM | POA: Diagnosis not present

## 2018-08-23 DIAGNOSIS — N186 End stage renal disease: Secondary | ICD-10-CM | POA: Diagnosis not present

## 2018-08-23 DIAGNOSIS — Z79899 Other long term (current) drug therapy: Secondary | ICD-10-CM | POA: Insufficient documentation

## 2018-08-23 DIAGNOSIS — D631 Anemia in chronic kidney disease: Secondary | ICD-10-CM | POA: Diagnosis not present

## 2018-08-23 DIAGNOSIS — J101 Influenza due to other identified influenza virus with other respiratory manifestations: Secondary | ICD-10-CM | POA: Insufficient documentation

## 2018-08-23 DIAGNOSIS — I120 Hypertensive chronic kidney disease with stage 5 chronic kidney disease or end stage renal disease: Secondary | ICD-10-CM | POA: Insufficient documentation

## 2018-08-23 DIAGNOSIS — Z992 Dependence on renal dialysis: Secondary | ICD-10-CM | POA: Diagnosis not present

## 2018-08-23 DIAGNOSIS — R05 Cough: Secondary | ICD-10-CM | POA: Diagnosis not present

## 2018-08-23 LAB — INFLUENZA PANEL BY PCR (TYPE A & B)
Influenza A By PCR: POSITIVE — AB
Influenza B By PCR: NEGATIVE

## 2018-08-23 MED ORDER — OSELTAMIVIR PHOSPHATE 75 MG PO CAPS: 75.0000 mg | ORAL_CAPSULE | Freq: Two times a day (BID) | ORAL | 0 refills | Status: DC

## 2018-08-23 MED ORDER — ACETAMINOPHEN 325 MG PO TABS
650.0000 mg | ORAL_TABLET | Freq: Once | ORAL | Status: AC
  Administered 2018-08-23: 650 mg via ORAL
  Filled 2018-08-23: qty 2
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
## 2018-08-23 NOTE — Discharge Instructions (Addendum)
Drink plenty of fluids.  Take Tylenol or Motrin for pain and follow-up if not improving

## 2018-08-23 NOTE — ED Triage Notes (Signed)
Pt had fever at dialysis today, was told to come to ED.  Cough, runny nose and headache

## 2018-08-26 DIAGNOSIS — N186 End stage renal disease: Secondary | ICD-10-CM | POA: Diagnosis not present

## 2018-08-26 DIAGNOSIS — D631 Anemia in chronic kidney disease: Secondary | ICD-10-CM | POA: Diagnosis not present

## 2018-08-26 DIAGNOSIS — N2581 Secondary hyperparathyroidism of renal origin: Secondary | ICD-10-CM | POA: Diagnosis not present

## 2018-08-26 DIAGNOSIS — Z992 Dependence on renal dialysis: Secondary | ICD-10-CM | POA: Diagnosis not present

## 2018-08-26 DIAGNOSIS — D509 Iron deficiency anemia, unspecified: Secondary | ICD-10-CM | POA: Diagnosis not present

## 2018-08-28 DIAGNOSIS — D509 Iron deficiency anemia, unspecified: Secondary | ICD-10-CM | POA: Diagnosis not present

## 2018-08-28 DIAGNOSIS — Z992 Dependence on renal dialysis: Secondary | ICD-10-CM | POA: Diagnosis not present

## 2018-08-28 DIAGNOSIS — D631 Anemia in chronic kidney disease: Secondary | ICD-10-CM | POA: Diagnosis not present

## 2018-08-28 DIAGNOSIS — N186 End stage renal disease: Secondary | ICD-10-CM | POA: Diagnosis not present

## 2018-08-28 DIAGNOSIS — N2581 Secondary hyperparathyroidism of renal origin: Secondary | ICD-10-CM | POA: Diagnosis not present

## 2018-08-30 DIAGNOSIS — N2581 Secondary hyperparathyroidism of renal origin: Secondary | ICD-10-CM | POA: Diagnosis not present

## 2018-08-30 DIAGNOSIS — Z992 Dependence on renal dialysis: Secondary | ICD-10-CM | POA: Diagnosis not present

## 2018-08-30 DIAGNOSIS — N186 End stage renal disease: Secondary | ICD-10-CM | POA: Diagnosis not present

## 2018-08-30 DIAGNOSIS — D509 Iron deficiency anemia, unspecified: Secondary | ICD-10-CM | POA: Diagnosis not present

## 2018-08-30 DIAGNOSIS — D631 Anemia in chronic kidney disease: Secondary | ICD-10-CM | POA: Diagnosis not present

## 2018-09-02 ENCOUNTER — Encounter (HOSPITAL_COMMUNITY): Payer: Self-pay

## 2018-09-02 ENCOUNTER — Other Ambulatory Visit: Payer: Self-pay

## 2018-09-02 ENCOUNTER — Emergency Department (HOSPITAL_COMMUNITY): Payer: Medicare Other

## 2018-09-02 ENCOUNTER — Inpatient Hospital Stay (HOSPITAL_COMMUNITY)
Admission: EM | Admit: 2018-09-02 | Discharge: 2018-09-04 | DRG: 193 | Disposition: A | Discharge status: Home / Self Care | Payer: Medicare Other | Attending: Internal Medicine | Admitting: Internal Medicine

## 2018-09-02 DIAGNOSIS — Z825 Family history of asthma and other chronic lower respiratory diseases: Secondary | ICD-10-CM

## 2018-09-02 DIAGNOSIS — N186 End stage renal disease: Secondary | ICD-10-CM | POA: Diagnosis present

## 2018-09-02 DIAGNOSIS — Z888 Allergy status to other drugs, medicaments and biological substances status: Secondary | ICD-10-CM

## 2018-09-02 DIAGNOSIS — J181 Lobar pneumonia, unspecified organism: Principal | ICD-10-CM | POA: Diagnosis present

## 2018-09-02 DIAGNOSIS — Z7951 Long term (current) use of inhaled steroids: Secondary | ICD-10-CM

## 2018-09-02 DIAGNOSIS — J189 Pneumonia, unspecified organism: Secondary | ICD-10-CM | POA: Diagnosis not present

## 2018-09-02 DIAGNOSIS — E8889 Other specified metabolic disorders: Secondary | ICD-10-CM | POA: Diagnosis present

## 2018-09-02 DIAGNOSIS — Y95 Nosocomial condition: Secondary | ICD-10-CM | POA: Diagnosis present

## 2018-09-02 DIAGNOSIS — Z716 Tobacco abuse counseling: Secondary | ICD-10-CM

## 2018-09-02 DIAGNOSIS — K219 Gastro-esophageal reflux disease without esophagitis: Secondary | ICD-10-CM | POA: Diagnosis present

## 2018-09-02 DIAGNOSIS — I739 Peripheral vascular disease, unspecified: Secondary | ICD-10-CM | POA: Diagnosis not present

## 2018-09-02 DIAGNOSIS — J440 Chronic obstructive pulmonary disease with acute lower respiratory infection: Secondary | ICD-10-CM | POA: Diagnosis present

## 2018-09-02 DIAGNOSIS — I10 Essential (primary) hypertension: Secondary | ICD-10-CM | POA: Diagnosis not present

## 2018-09-02 DIAGNOSIS — Z79899 Other long term (current) drug therapy: Secondary | ICD-10-CM

## 2018-09-02 DIAGNOSIS — R0602 Shortness of breath: Secondary | ICD-10-CM | POA: Diagnosis not present

## 2018-09-02 DIAGNOSIS — E875 Hyperkalemia: Secondary | ICD-10-CM | POA: Diagnosis not present

## 2018-09-02 DIAGNOSIS — D631 Anemia in chronic kidney disease: Secondary | ICD-10-CM | POA: Diagnosis present

## 2018-09-02 DIAGNOSIS — I120 Hypertensive chronic kidney disease with stage 5 chronic kidney disease or end stage renal disease: Secondary | ICD-10-CM | POA: Diagnosis present

## 2018-09-02 DIAGNOSIS — Z8249 Family history of ischemic heart disease and other diseases of the circulatory system: Secondary | ICD-10-CM

## 2018-09-02 DIAGNOSIS — Z9114 Patient's other noncompliance with medication regimen: Secondary | ICD-10-CM

## 2018-09-02 DIAGNOSIS — Z992 Dependence on renal dialysis: Secondary | ICD-10-CM

## 2018-09-02 DIAGNOSIS — F17210 Nicotine dependence, cigarettes, uncomplicated: Secondary | ICD-10-CM | POA: Diagnosis present

## 2018-09-02 HISTORY — DX: Pneumonia, unspecified organism: J18.9

## 2018-09-02 LAB — CBC WITH DIFFERENTIAL/PLATELET
ABS IMMATURE GRANULOCYTES: 0.03 10*3/uL (ref 0.00–0.07)
Basophils Absolute: 0 10*3/uL (ref 0.0–0.1)
Basophils Relative: 0 %
Eosinophils Absolute: 0 10*3/uL (ref 0.0–0.5)
Eosinophils Relative: 0 %
HCT: 34.9 % — ABNORMAL LOW (ref 39.0–52.0)
Hemoglobin: 10.8 g/dL — ABNORMAL LOW (ref 13.0–17.0)
Immature Granulocytes: 0 %
Lymphocytes Relative: 12 %
Lymphs Abs: 1.3 10*3/uL (ref 0.7–4.0)
MCH: 33.1 pg (ref 26.0–34.0)
MCHC: 30.9 g/dL (ref 30.0–36.0)
MCV: 107.1 fL — AB (ref 80.0–100.0)
Monocytes Absolute: 0.9 10*3/uL (ref 0.1–1.0)
Monocytes Relative: 8 %
Neutro Abs: 8.6 10*3/uL — ABNORMAL HIGH (ref 1.7–7.7)
Neutrophils Relative %: 80 %
Platelets: 502 10*3/uL — ABNORMAL HIGH (ref 150–400)
RBC: 3.26 MIL/uL — ABNORMAL LOW (ref 4.22–5.81)
RDW: 13 % (ref 11.5–15.5)
WBC: 10.9 10*3/uL — ABNORMAL HIGH (ref 4.0–10.5)
nRBC: 0 % (ref 0.0–0.2)

## 2018-09-02 LAB — COMPREHENSIVE METABOLIC PANEL
ALK PHOS: 111 U/L (ref 38–126)
ALT: 12 U/L (ref 0–44)
AST: 17 U/L (ref 15–41)
Albumin: 2.7 g/dL — ABNORMAL LOW (ref 3.5–5.0)
Anion gap: 13 (ref 5–15)
BUN: 50 mg/dL — ABNORMAL HIGH (ref 6–20)
CO2: 19 mmol/L — ABNORMAL LOW (ref 22–32)
CREATININE: 12.74 mg/dL — AB (ref 0.61–1.24)
Calcium: 8.7 mg/dL — ABNORMAL LOW (ref 8.9–10.3)
Chloride: 103 mmol/L (ref 98–111)
GFR calc Af Amer: 4 mL/min — ABNORMAL LOW (ref >60)
GFR calc non Af Amer: 4 mL/min — ABNORMAL LOW (ref >60)
Glucose, Bld: 104 mg/dL — ABNORMAL HIGH (ref 70–99)
Potassium: 5.6 mmol/L — ABNORMAL HIGH (ref 3.5–5.1)
Sodium: 135 mmol/L (ref 135–145)
TOTAL PROTEIN: 7.1 g/dL (ref 6.5–8.1)
Total Bilirubin: 0.5 mg/dL (ref 0.3–1.2)

## 2018-09-02 LAB — TROPONIN I: Troponin I: <0.03 ng/mL (ref <0.03)

## 2018-09-02 LAB — MRSA PCR SCREENING: MRSA by PCR: NEGATIVE

## 2018-09-02 LAB — BRAIN NATRIURETIC PEPTIDE: B Natriuretic Peptide: 1679 pg/mL — ABNORMAL HIGH (ref 0.0–100.0)

## 2018-09-02 MED ORDER — IPRATROPIUM-ALBUTEROL 0.5-2.5 (3) MG/3ML IN SOLN: 3.0000 mL | RESPIRATORY_TRACT | Status: DC | PRN

## 2018-09-02 MED ORDER — GUAIFENESIN-DM 100-10 MG/5ML PO SYRP
10.0000 mL | ORAL_SOLUTION | Freq: Three times a day (TID) | ORAL | Status: AC
  Administered 2018-09-02 – 2018-09-04 (×6): 10 mL via ORAL
  Filled 2018-09-02 (×6): qty 10

## 2018-09-02 MED ORDER — PIPERACILLIN-TAZOBACTAM 3.375 G IVPB 30 MIN
3.3750 g | Freq: Once | INTRAVENOUS | Status: AC
  Administered 2018-09-02: 3.375 g via INTRAVENOUS
  Filled 2018-09-02: qty 50

## 2018-09-02 MED ORDER — IPRATROPIUM-ALBUTEROL 0.5-2.5 (3) MG/3ML IN SOLN
3.0000 mL | Freq: Once | RESPIRATORY_TRACT | Status: AC
  Administered 2018-09-03: 3 mL via RESPIRATORY_TRACT
  Filled 2018-09-02: qty 3

## 2018-09-02 MED ORDER — ACETAMINOPHEN 650 MG RE SUPP: 650.0000 mg | Freq: Four times a day (QID) | RECTAL | Status: DC | PRN

## 2018-09-02 MED ORDER — ONDANSETRON HCL 4 MG/2ML IJ SOLN: 4.0000 mg | Freq: Four times a day (QID) | INTRAMUSCULAR | Status: DC | PRN

## 2018-09-02 MED ORDER — ACETAMINOPHEN 325 MG PO TABS: 650.0000 mg | ORAL_TABLET | Freq: Four times a day (QID) | ORAL | Status: DC | PRN

## 2018-09-02 MED ORDER — HYDRALAZINE HCL 25 MG PO TABS
25.0000 mg | ORAL_TABLET | Freq: Three times a day (TID) | ORAL | Status: DC
  Administered 2018-09-02 – 2018-09-03 (×4): 25 mg via ORAL
  Filled 2018-09-02 (×5): qty 1

## 2018-09-02 MED ORDER — LATANOPROST 0.005 % OP SOLN
1.0000 [drp] | Freq: Every day | OPHTHALMIC | Status: DC
  Administered 2018-09-02 – 2018-09-03 (×2): 1 [drp] via OPHTHALMIC
  Filled 2018-09-02: qty 2.5

## 2018-09-02 MED ORDER — VANCOMYCIN HCL 10 G IV SOLR
1500.0000 mg | Freq: Once | INTRAVENOUS | Status: AC
  Administered 2018-09-02: 1500 mg via INTRAVENOUS
  Filled 2018-09-02: qty 1500

## 2018-09-02 MED ORDER — SODIUM CHLORIDE 0.9 % IV SOLN
1.0000 g | INTRAVENOUS | Status: DC
  Administered 2018-09-03 – 2018-09-04 (×2): 1 g via INTRAVENOUS
  Filled 2018-09-02 (×3): qty 1

## 2018-09-02 MED ORDER — CYCLOSPORINE 0.05 % OP EMUL
1.0000 [drp] | Freq: Two times a day (BID) | OPHTHALMIC | Status: DC
  Administered 2018-09-02 – 2018-09-04 (×4): 1 [drp] via OPHTHALMIC
  Filled 2018-09-02 (×7): qty 30

## 2018-09-02 MED ORDER — TRAMADOL HCL 50 MG PO TABS
50.0000 mg | ORAL_TABLET | Freq: Two times a day (BID) | ORAL | Status: DC
  Administered 2018-09-02 – 2018-09-04 (×4): 50 mg via ORAL
  Filled 2018-09-02 (×4): qty 1

## 2018-09-02 MED ORDER — LABETALOL HCL 5 MG/ML IV SOLN
10.0000 mg | Freq: Once | INTRAVENOUS | Status: AC
  Administered 2018-09-02: 10 mg via INTRAVENOUS
  Filled 2018-09-02: qty 4

## 2018-09-02 MED ORDER — VANCOMYCIN HCL IN DEXTROSE 1-5 GM/200ML-% IV SOLN
1000.0000 mg | Freq: Once | INTRAVENOUS | Status: DC
  Filled 2018-09-02: qty 200

## 2018-09-02 MED ORDER — POLYETHYLENE GLYCOL 3350 17 G PO PACK: 17.0000 g | PACK | Freq: Every day | ORAL | Status: DC | PRN

## 2018-09-02 MED ORDER — ONDANSETRON HCL 4 MG PO TABS: 4.0000 mg | ORAL_TABLET | Freq: Four times a day (QID) | ORAL | Status: DC | PRN

## 2018-09-02 MED ORDER — HEPARIN SODIUM (PORCINE) 5000 UNIT/ML IJ SOLN
5000.0000 [IU] | Freq: Three times a day (TID) | INTRAMUSCULAR | Status: DC
  Administered 2018-09-02 – 2018-09-04 (×5): 5000 [IU] via SUBCUTANEOUS
  Filled 2018-09-02 (×5): qty 1

## 2018-09-02 MED ORDER — HYDRALAZINE HCL 20 MG/ML IJ SOLN: 10.0000 mg | INTRAMUSCULAR | Status: DC | PRN

## 2018-09-02 MED ORDER — SODIUM CHLORIDE 0.9 % IV SOLN
INTRAVENOUS | Status: DC | PRN
  Administered 2018-09-02: 500 mL via INTRAVENOUS

## 2018-09-02 MED ORDER — AMLODIPINE BESYLATE 5 MG PO TABS
10.0000 mg | ORAL_TABLET | Freq: Every day | ORAL | Status: DC
  Filled 2018-09-02: qty 2

## 2018-09-02 MED ORDER — CARVEDILOL 12.5 MG PO TABS
25.0000 mg | ORAL_TABLET | Freq: Two times a day (BID) | ORAL | Status: DC
  Administered 2018-09-02 – 2018-09-03 (×2): 25 mg via ORAL
  Filled 2018-09-02 (×3): qty 2

## 2018-09-02 MED ORDER — MOMETASONE FURO-FORMOTEROL FUM 200-5 MCG/ACT IN AERO
2.0000 | INHALATION_SPRAY | Freq: Two times a day (BID) | RESPIRATORY_TRACT | Status: DC
  Administered 2018-09-02 – 2018-09-04 (×4): 2 via RESPIRATORY_TRACT
  Filled 2018-09-02: qty 8.8

## 2018-09-02 NOTE — ED Triage Notes (Signed)
Pt reports was diagnosed with flu 2 weeks ago.  C/O generalized weakness, sob, cough, chest, neck, and back pain.

## 2018-09-02 NOTE — Progress Notes (Signed)
Pharmacy Antibiotic Note  Luke Mueller is a 58 y.o. male admitted on 09/02/2018 with pneumonia.  Pharmacy has been consulted for Vancomycin and Cefepime dosing.  Patient is ESRD with MWF dialysis sessions.  He has not been tolerating dialysis recently due to SOB.  Plan: Vancomycin 1500 mg IV x 1 dose. Cefepime 1000 mg IV every 24 hours. Will monitor patient for follow-up doses of Vancomycin based on completion of dialysis- patient has no completed last 3 dialysis sessions. Monitor labs, c/s, and vanco levels as indicated   Height: 5\' 10"  (177.8 cm) Weight: 137 lb (62.1 kg) IBW/kg (Calculated) : 73  Temp (24hrs), Avg:98.9 F (37.2 C), Min:98.9 F (37.2 C), Max:98.9 F (37.2 C)  Recent Labs  Lab 09/02/18 1325  WBC 10.9*  CREATININE 12.74*    Estimated Creatinine Clearance: 5.6 mL/min (A) (by C-G formula based on SCr of 12.74 mg/dL (H)).    Allergies  Allergen Reactions  . Lotrel [Amlodipine Besy-Benazepril Hcl] Swelling    Lips swelling    Antimicrobials this admission: Vanco 2/24 >>  Cefepime 2/24 >>   Dose adjustments this admission: Vanco/Cefepime  Microbiology results: 2/24 BCx: pending   Thank you for allowing pharmacy to be a part of this patient's care.  Margot Ables, PharmD Clinical Pharmacist 09/02/2018 3:57 PM

## 2018-09-02 NOTE — Progress Notes (Signed)
BP 175/89 pulse 92.  Texted Dr. Arlyce Dice to see if she wants hydralazine prn
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
## 2018-09-03 DIAGNOSIS — F17210 Nicotine dependence, cigarettes, uncomplicated: Secondary | ICD-10-CM | POA: Diagnosis present

## 2018-09-03 DIAGNOSIS — Z79899 Other long term (current) drug therapy: Secondary | ICD-10-CM | POA: Diagnosis not present

## 2018-09-03 DIAGNOSIS — I120 Hypertensive chronic kidney disease with stage 5 chronic kidney disease or end stage renal disease: Secondary | ICD-10-CM | POA: Diagnosis present

## 2018-09-03 DIAGNOSIS — E875 Hyperkalemia: Secondary | ICD-10-CM | POA: Diagnosis not present

## 2018-09-03 DIAGNOSIS — Z992 Dependence on renal dialysis: Secondary | ICD-10-CM | POA: Diagnosis not present

## 2018-09-03 DIAGNOSIS — N186 End stage renal disease: Secondary | ICD-10-CM | POA: Diagnosis present

## 2018-09-03 DIAGNOSIS — I10 Essential (primary) hypertension: Secondary | ICD-10-CM | POA: Diagnosis not present

## 2018-09-03 DIAGNOSIS — Z9114 Patient's other noncompliance with medication regimen: Secondary | ICD-10-CM | POA: Diagnosis not present

## 2018-09-03 DIAGNOSIS — J181 Lobar pneumonia, unspecified organism: Secondary | ICD-10-CM | POA: Diagnosis present

## 2018-09-03 DIAGNOSIS — Z888 Allergy status to other drugs, medicaments and biological substances status: Secondary | ICD-10-CM | POA: Diagnosis not present

## 2018-09-03 DIAGNOSIS — J440 Chronic obstructive pulmonary disease with acute lower respiratory infection: Secondary | ICD-10-CM | POA: Diagnosis present

## 2018-09-03 DIAGNOSIS — D631 Anemia in chronic kidney disease: Secondary | ICD-10-CM | POA: Diagnosis present

## 2018-09-03 DIAGNOSIS — Z8249 Family history of ischemic heart disease and other diseases of the circulatory system: Secondary | ICD-10-CM | POA: Diagnosis not present

## 2018-09-03 DIAGNOSIS — N2581 Secondary hyperparathyroidism of renal origin: Secondary | ICD-10-CM | POA: Diagnosis not present

## 2018-09-03 DIAGNOSIS — E8889 Other specified metabolic disorders: Secondary | ICD-10-CM | POA: Diagnosis present

## 2018-09-03 DIAGNOSIS — Z716 Tobacco abuse counseling: Secondary | ICD-10-CM | POA: Diagnosis not present

## 2018-09-03 DIAGNOSIS — I739 Peripheral vascular disease, unspecified: Secondary | ICD-10-CM | POA: Diagnosis present

## 2018-09-03 DIAGNOSIS — Z7951 Long term (current) use of inhaled steroids: Secondary | ICD-10-CM | POA: Diagnosis not present

## 2018-09-03 DIAGNOSIS — Y95 Nosocomial condition: Secondary | ICD-10-CM | POA: Diagnosis present

## 2018-09-03 DIAGNOSIS — K219 Gastro-esophageal reflux disease without esophagitis: Secondary | ICD-10-CM | POA: Diagnosis present

## 2018-09-03 DIAGNOSIS — J189 Pneumonia, unspecified organism: Secondary | ICD-10-CM | POA: Diagnosis not present

## 2018-09-03 DIAGNOSIS — Z825 Family history of asthma and other chronic lower respiratory diseases: Secondary | ICD-10-CM | POA: Diagnosis not present

## 2018-09-03 DIAGNOSIS — R0602 Shortness of breath: Secondary | ICD-10-CM | POA: Diagnosis not present

## 2018-09-03 MED ORDER — HEPARIN SODIUM (PORCINE) 1000 UNIT/ML DIALYSIS: 1000.0000 [IU] | INTRAMUSCULAR | Status: DC | PRN

## 2018-09-03 MED ORDER — SODIUM CHLORIDE 0.9 % IV SOLN: 100.0000 mL | INTRAVENOUS | Status: DC | PRN

## 2018-09-03 MED ORDER — ALBUTEROL SULFATE (2.5 MG/3ML) 0.083% IN NEBU
2.5000 mg | INHALATION_SOLUTION | Freq: Four times a day (QID) | RESPIRATORY_TRACT | Status: DC
  Administered 2018-09-03 – 2018-09-04 (×2): 2.5 mg via RESPIRATORY_TRACT
  Filled 2018-09-03 (×2): qty 3

## 2018-09-03 MED ORDER — LIDOCAINE-PRILOCAINE 2.5-2.5 % EX CREA: 1.0000 "application " | TOPICAL_CREAM | CUTANEOUS | Status: DC | PRN

## 2018-09-03 MED ORDER — CYCLOBENZAPRINE HCL 10 MG PO TABS: 5.0000 mg | ORAL_TABLET | Freq: Three times a day (TID) | ORAL | Status: DC | PRN

## 2018-09-03 MED ORDER — PENTAFLUOROPROP-TETRAFLUOROETH EX AERO: 1.0000 "application " | INHALATION_SPRAY | CUTANEOUS | Status: DC | PRN

## 2018-09-03 MED ORDER — VANCOMYCIN HCL IN DEXTROSE 750-5 MG/150ML-% IV SOLN
750.0000 mg | Freq: Once | INTRAVENOUS | Status: AC
  Administered 2018-09-03: 750 mg via INTRAVENOUS
  Filled 2018-09-03: qty 150

## 2018-09-03 MED ORDER — CALCIUM ACETATE (PHOS BINDER) 667 MG PO CAPS
1334.0000 mg | ORAL_CAPSULE | Freq: Three times a day (TID) | ORAL | Status: DC
  Administered 2018-09-03 – 2018-09-04 (×4): 1334 mg via ORAL
  Filled 2018-09-03 (×4): qty 2

## 2018-09-03 MED ORDER — CHLORHEXIDINE GLUCONATE CLOTH 2 % EX PADS
6.0000 | MEDICATED_PAD | Freq: Every day | CUTANEOUS | Status: DC
  Administered 2018-09-03 – 2018-09-04 (×2): 6 via TOPICAL

## 2018-09-03 MED ORDER — LIDOCAINE HCL (PF) 1 % IJ SOLN: 5.0000 mL | INTRAMUSCULAR | Status: DC | PRN

## 2018-09-03 MED ORDER — GUAIFENESIN ER 600 MG PO TB12
600.0000 mg | ORAL_TABLET | Freq: Two times a day (BID) | ORAL | Status: DC
  Administered 2018-09-03 – 2018-09-04 (×3): 600 mg via ORAL
  Filled 2018-09-03 (×3): qty 1

## 2018-09-03 MED ORDER — HEPARIN SODIUM (PORCINE) 1000 UNIT/ML DIALYSIS: 20.0000 [IU]/kg | INTRAMUSCULAR | Status: DC | PRN

## 2018-09-03 MED ORDER — ALTEPLASE 2 MG IJ SOLR: 2.0000 mg | Freq: Once | INTRAMUSCULAR | Status: DC | PRN

## 2018-09-03 MED ORDER — DOXERCALCIFEROL 4 MCG/2ML IV SOLN
2.5000 ug | INTRAVENOUS | Status: DC
  Administered 2018-09-04: 2.5 ug via INTRAVENOUS
  Filled 2018-09-03: qty 2

## 2018-09-03 MED ORDER — DARBEPOETIN ALFA 60 MCG/0.3ML IJ SOSY
60.0000 ug | PREFILLED_SYRINGE | INTRAMUSCULAR | Status: DC
  Administered 2018-09-04: 60 ug via INTRAVENOUS
  Filled 2018-09-03: qty 0.3
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
## 2018-09-03 NOTE — Procedures (Signed)
    HEMODIALYSIS TREATMENT NOTE (Tue 25 Feb):  3.5 hour low-heparin dialysis completed via right forearm AVF (15g/antegrade). Goal met: 4.4 L removed without interruption in ultrafiltration.  All blood was returned and hemostasis was achieved in 10 minutes.  Rockwell Alexandria, RN

## 2018-09-03 NOTE — Progress Notes (Signed)
Patient Demographics:    Luke Mueller, is a 58 y.o. male, DOB - Jan 04, 1961, FYT:[id number]  Admit date - 09/02/2018   Admitting Physician Ejiroghene Arlyce Dice, MD  Outpatient Primary MD for the patient is Rosita Fire, MD  LOS - 0   Chief Complaint  Patient presents with  . Shortness of Breath        Subjective:    Luke Mueller today has no fevers, no emesis,  No chest pain, cough and shortness of breath persist, hypoxia persist,  Assessment  & Plan :    Active Problems:   Pneumonia  Brief summary 58 y.o. male with Pmhx significant for COPD, HTN  and ESRD, anemia of ESRD,, PAD admitted on 09/02/2018 with right-sided pneumonia/HCAP.  Plan:-   1)HCAP--- right-sided pneumonia in a patient with recent influenza illness (Dxed with flu 08/23/2018)----hypoxia persist, treat empirically with Vanco and Zosyn, may de-escalate antibiotics over the next 24 to 48 hours if clinical improvement, MRSA PCR negative so stop vancomycin, continue mucolytics, bronchodilators and supplemental oxygen, does not meet sepsis criteria, WBC was 10.9,  2)ESRD--- discussed with nephrology service patient usually gets HD Mondays Wednesdays and Fridays, he missed HD on 09/02/2018, plan is for serial HD on 09/03/2018 and again on 09/04/2018, estimated dry weight 53.5 kg  3)HTN--restart amlodipine 10 mg daily, Coreg 25 mg twice daily, hydralazine 25 mg 3 times daily,  may use IV Hydralazine 10 mg  Every 4 hours Prn for systolic blood pressure over 160 mmhg  4) anemia of ESRD----hemoglobin stable above 10, continue Aranesp per nephrology team  Disposition/Need for in-Hospital Stay- patient unable to be discharged at this time due to respiratory distress in the setting of pneumonia and possible volume overload, patient needs serial HD to address volume status and IV antibiotics for pneumonia  Code Status : full  Family Communication:    None at bedside   Disposition Plan  : home  Consults  :  nephrology  DVT Prophylaxis  :   - Heparin    Lab Results  Component Value Date   PLT 502 (H) 09/02/2018    Inpatient Medications  Scheduled Meds: . albuterol  2.5 mg Nebulization QID  . amLODipine  10 mg Oral Daily  . calcium acetate  1,334 mg Oral TID WC  . carvedilol  25 mg Oral BID WC  . Chlorhexidine Gluconate Cloth  6 each Topical Q0600  . cycloSPORINE  1 drop Both Eyes BID  . [START ON 09/04/2018] darbepoetin (ARANESP) injection - DIALYSIS  60 mcg Intravenous Q Wed-HD  . [START ON 09/04/2018] doxercalciferol  2.5 mcg Intravenous Q M,W,F-HD  . guaiFENesin  600 mg Oral BID  . guaiFENesin-dextromethorphan  10 mL Oral Q8H  . heparin  5,000 Units Subcutaneous Q8H  . hydrALAZINE  25 mg Oral TID  . latanoprost  1 drop Both Eyes QHS  . mometasone-formoterol  2 puff Inhalation BID  . traMADol  50 mg Oral BID   Continuous Infusions: . sodium chloride    . sodium chloride    . ceFEPime (MAXIPIME) IV 1 g (09/03/18 0019)   PRN Meds:.sodium chloride, sodium chloride, acetaminophen **OR** acetaminophen, alteplase, cyclobenzaprine, heparin, heparin, hydrALAZINE, ipratropium-albuterol, lidocaine (PF), lidocaine-prilocaine, ondansetron **OR** ondansetron (ZOFRAN) IV, pentafluoroprop-tetrafluoroeth, polyethylene glycol  Anti-infectives (From admission, onward)   Start     Dose/Rate Route Frequency Ordered Stop   09/03/18 1600  vancomycin (VANCOCIN) IVPB 750 mg/150 ml premix     750 mg 150 mL/hr over 60 Minutes Intravenous  Once 09/03/18 1106 09/03/18 1725   09/03/18 0000  ceFEPIme (MAXIPIME) 1 g in sodium chloride 0.9 % 100 mL IVPB     1 g 200 mL/hr over 30 Minutes Intravenous Every 24 hours 09/02/18 1552     09/02/18 1600  vancomycin (VANCOCIN) 1,500 mg in sodium chloride 0.9 % 500 mL IVPB     1,500 mg 250 mL/hr over 120 Minutes Intravenous  Once 09/02/18 1550 09/02/18 1843   09/02/18 1430  vancomycin (VANCOCIN) IVPB  1000 mg/200 mL premix  Status:  Discontinued     1,000 mg 200 mL/hr over 60 Minutes Intravenous  Once 09/02/18 1424 09/02/18 1550   09/02/18 1430  piperacillin-tazobactam (ZOSYN) IVPB 3.375 g     3.375 g 100 mL/hr over 30 Minutes Intravenous  Once 09/02/18 1424 09/02/18 1633        Objective:   Vitals:   09/03/18 1630 09/03/18 1700 09/03/18 1725 09/03/18 1735  BP: (!) 152/96 (!) 155/93 (!) 157/96 (!) 155/91  Pulse: (!) 111 (!) 110 (!) 104 100  Resp:    18  Temp:    98.4 F (36.9 C)  TempSrc:    Oral  SpO2:    97%  Weight:    56 kg  Height:        Wt Readings from Last 3 Encounters:  09/03/18 56 kg  08/23/18 56.7 kg  07/24/18 53 kg     Intake/Output Summary (Last 24 hours) at 09/03/2018 1910 Last data filed at 09/03/2018 1759 Gross per 24 hour  Intake 820 ml  Output 4400 ml  Net -3580 ml   Physical Exam Patient is examined daily including today on 09/03/18 , exams remain the same as of yesterday except that has changed   Gen:- Awake Alert, some conversational dyspnea HEENT:- [place].AT, No sclera icterus Neck-Supple Neck,No JVD,.  Nose- [place] 2 L/min Lungs-diminished in bases scattered rhonchi on the right  CV- S1, S2 normal, regular  Abd-  +ve B.Sounds, Abd Soft, No tenderness,    Extremity/Skin:- No  edema, pedal pulses present, right upper extremity AV fistula with positive thrill and bruit Psych-affect is appropriate, oriented x3 Neuro-no new focal deficits, no tremors   Data Review:   Micro Results Recent Results (from the past 240 hour(s))  Culture, blood (Routine X 2) w Reflex to ID Panel     Status: None (Preliminary result)   Collection Time: 09/02/18  1:25 PM  Result Value Ref Range Status   Specimen Description BLOOD LEFT ANTECUBITAL  Final   Special Requests   Final    BOTTLES DRAWN AEROBIC AND ANAEROBIC Blood Culture adequate volume   Culture   Final    NO GROWTH < 24 HOURS Performed at [facility], [address], [place], [place] [address]     Report Status PENDING  Incomplete  Culture, blood (Routine X 2) w Reflex to ID Panel     Status: None (Preliminary result)   Collection Time: 09/02/18  2:51 PM  Result Value Ref Range Status   Specimen Description BLOOD LEFT FOREARM  Final   Special Requests   Final    BOTTLES DRAWN AEROBIC AND ANAEROBIC Blood Culture adequate volume   Culture   Final    NO GROWTH < 24 HOURS Performed  at [facility], [address] [place], [place] [address]    Report Status PENDING  Incomplete  MRSA PCR Screening     Status: None   Collection Time: 09/02/18  5:33 PM  Result Value Ref Range Status   MRSA by PCR NEGATIVE NEGATIVE Final    Comment:        The GeneXpert MRSA Assay (FDA approved for NASAL specimens only), is one component of a comprehensive MRSA colonization surveillance program. It is not intended to diagnose MRSA infection nor to guide or monitor treatment for MRSA infections. Performed at [facility], [address] [place], [place] [address]    Radiology Reports Dg Chest 2 View  Result Date: 09/02/2018 CLINICAL DATA:  Diagnosis flu 2 weeks ago.  Shortness of breath. EXAM: CHEST - 2 VIEW COMPARISON:  August 23, 2018 FINDINGS: No pneumothorax. The heart, hila, and mediastinum are normal. Right middle lobe opacity is identified suggestive of pneumonia given history. Mild increased interstitial markings centrally in the lungs. No other acute abnormalities. IMPRESSION: 1. Right middle lobe infiltrate, likely pneumonia given history. Recommend follow-up to resolution. 2. Mild increased interstitial markings centrally may represent pulmonary venous congestion or atypical infection. Recommend attention on follow-up. Electronically Signed   By: Dorise Bullion III M.D   On: 09/02/2018 13:25   Dg Chest 2 View  Result Date: 08/23/2018 CLINICAL DATA:  Fever and cough. EXAM: CHEST - 2 VIEW COMPARISON:  08/08/2018 and 07/23/2018 and 03/06/2018 FINDINGS: Heart size and vascularity are  normal. There is slight peribronchial thickening but there are no infiltrates or effusions. Focal linear atelectasis at the right lung base posteriorly. No acute bone abnormality. IMPRESSION: Slight bronchitic changes. Focal linear atelectasis at the right lung base posteriorly Electronically Signed   By: Lorriane Shire M.D.   On: 08/23/2018 17:27   Dg Chest 2 View  Result Date: 08/09/2018 CLINICAL DATA:  58 year old male with a history of respiratory failure previously EXAM: CHEST - 2 VIEW COMPARISON:  07/23/2018, 03/06/2018 FINDINGS: Cardiomediastinal silhouette unchanged in size and contour. Stigmata of emphysema, with increased retrosternal airspace, flattened hemidiaphragms, increased AP diameter, and hyperinflation on the AP view. Coarsened interstitial markings. Similar appearance of pleuroparenchymal thickening at the apices. Improved aeration compared to the prior plain film. No pneumothorax or new confluent airspace disease. Calcifications of the coronary arteries. No displaced fracture.  Degenerative changes. IMPRESSION: Improved aeration, evidence of clearing infection. Chronic lung changes and emphysema. Electronically Signed   By: Corrie Mckusick D.O.   On: 08/09/2018 10:04     CBC Recent Labs  Lab 09/02/18 1325  WBC 10.9*  HGB 10.8*  HCT 34.9*  PLT 502*  MCV 107.1*  MCH 33.1  MCHC 30.9  RDW 13.0  LYMPHSABS 1.3  MONOABS 0.9  EOSABS 0.0  BASOSABS 0.0    Chemistries  Recent Labs  Lab 09/02/18 1325  NA 135  K 5.6*  CL 103  CO2 19*  GLUCOSE 104*  BUN 50*  CREATININE 12.74*  CALCIUM 8.7*  AST 17  ALT 12  ALKPHOS 111  BILITOT 0.5  Cardiac Enzymes Recent Labs  Lab 09/02/18 1325  TROPONINI <0.03   ------------------------------------------------------------------------------------------------------------------    Component Value Date/Time   BNP 1,679.0 (H) 09/02/2018 1325     Luke Mueller M.D on 09/03/2018 at 7:10 PM  Go to [email or web address] - for contact  info  [facility] - Office  [phone]

## 2018-09-03 NOTE — Care Management Obs Status (Signed)
[facility] NOTIFICATION   Patient Details  Name: Luke Mueller MRN: [id number] Date of Birth: March 10, 1961   Medicare Observation Status Notification Given:  Yes    Azarian Starace, Chauncey Reading, RN 09/03/2018, 9:15 AM

## 2018-09-04 ENCOUNTER — Inpatient Hospital Stay (HOSPITAL_COMMUNITY): Payer: Medicare Other

## 2018-09-04 DIAGNOSIS — I10 Essential (primary) hypertension: Secondary | ICD-10-CM

## 2018-09-04 DIAGNOSIS — J181 Lobar pneumonia, unspecified organism: Secondary | ICD-10-CM

## 2018-09-04 DIAGNOSIS — J189 Pneumonia, unspecified organism: Secondary | ICD-10-CM

## 2018-09-04 DIAGNOSIS — E875 Hyperkalemia: Secondary | ICD-10-CM

## 2018-09-04 DIAGNOSIS — N186 End stage renal disease: Secondary | ICD-10-CM

## 2018-09-04 LAB — BASIC METABOLIC PANEL
Anion gap: 12 (ref 5–15)
BUN: 19 mg/dL (ref 6–20)
CO2: 27 mmol/L (ref 22–32)
Calcium: 8.4 mg/dL — ABNORMAL LOW (ref 8.9–10.3)
Chloride: 96 mmol/L — ABNORMAL LOW (ref 98–111)
Creatinine, Ser: 7.19 mg/dL — ABNORMAL HIGH (ref 0.61–1.24)
GFR calc Af Amer: 9 mL/min — ABNORMAL LOW (ref >60)
GFR calc non Af Amer: 8 mL/min — ABNORMAL LOW (ref >60)
Glucose, Bld: 86 mg/dL (ref 70–99)
Potassium: 4.1 mmol/L (ref 3.5–5.1)
Sodium: 135 mmol/L (ref 135–145)

## 2018-09-04 LAB — CBC
HEMATOCRIT: 36.5 % — AB (ref 39.0–52.0)
Hemoglobin: 11.3 g/dL — ABNORMAL LOW (ref 13.0–17.0)
MCH: 32.7 pg (ref 26.0–34.0)
MCHC: 31 g/dL (ref 30.0–36.0)
MCV: 105.5 fL — ABNORMAL HIGH (ref 80.0–100.0)
Platelets: 472 10*3/uL — ABNORMAL HIGH (ref 150–400)
RBC: 3.46 MIL/uL — ABNORMAL LOW (ref 4.22–5.81)
RDW: 12.8 % (ref 11.5–15.5)
WBC: 8.8 10*3/uL (ref 4.0–10.5)
nRBC: 0 % (ref 0.0–0.2)

## 2018-09-04 LAB — HEPATITIS B SURFACE ANTIGEN: Hepatitis B Surface Ag: NEGATIVE

## 2018-09-04 MED ORDER — ALBUMIN HUMAN 25 % IV SOLN
25.0000 g | Freq: Once | INTRAVENOUS | Status: AC
  Administered 2018-09-04: 25 g via INTRAVENOUS
  Filled 2018-09-04: qty 100

## 2018-09-04 MED ORDER — LEVOFLOXACIN 500 MG PO TABS: 500.0000 mg | ORAL_TABLET | Freq: Once | ORAL | 0 refills | Status: AC

## 2018-09-04 MED ORDER — LEVOFLOXACIN 750 MG PO TABS
750.0000 mg | ORAL_TABLET | Freq: Once | ORAL | Status: AC
  Administered 2018-09-04: 750 mg via ORAL
  Filled 2018-09-04: qty 1

## 2018-09-04 MED ORDER — LEVOFLOXACIN 500 MG PO TABS: 500.0000 mg | ORAL_TABLET | ORAL | Status: DC

## 2018-09-04 MED ORDER — HEPARIN SODIUM (PORCINE) 1000 UNIT/ML DIALYSIS: 20.0000 [IU]/kg | INTRAMUSCULAR | Status: DC | PRN

## 2018-09-04 MED ORDER — CHLORHEXIDINE GLUCONATE CLOTH 2 % EX PADS: 6.0000 | MEDICATED_PAD | Freq: Every day | CUTANEOUS | Status: DC

## 2018-09-04 NOTE — Procedures (Signed)
    HEMODIALYSIS TREATMENT NOTE (Wed 26 Feb):  3.5 hour low-heparin dialysis completed via right forearm AVF (15g antegrade). Goal NOT met: Unable to tolerate removal of 4-4.5 L as ordered.  Ultrafiltration rate was decreased three times and interrupted x 15 minutes in response to symptomatic hypotension.  Albumin 25g given once.  Pt also c/o cramping.  Net UF 3.3 liters.  All blood was returned and hemostasis was achieved in 10 minutes.   Post weight 52.2kg was reported to Dr. Lowanda Foster for appropriate EDW adjustment.   Rockwell Alexandria, RN

## 2018-09-04 NOTE — Progress Notes (Signed)
IV removed, WNL. D/C instructions given to pt, verbalized understanding. Pt family member at bedside to transport home.

## 2018-09-04 NOTE — Progress Notes (Signed)
Subjective:  HD yest- removed 4400 no BP drop- weight today still 2.5 over supposed EDW- he says it was bad and dialysis has been going badly in general but has been told cannot have transplant due to social issues  Objective Vital signs in last 24 hours: Vitals:   09/03/18 2123 09/03/18 2327 09/04/18 0636 09/04/18 0733  BP: (!) 183/98 (!) 161/86 (!) 160/86   Pulse: (!) 103 (!) 101 (!) 104   Resp: 18 (!) 24 20   Temp: 98.9 F (37.2 C) 99.5 F (37.5 C) 99.5 F (37.5 C)   TempSrc: Oral Oral Oral   SpO2: 100% 99% 100% 99%  Weight:      Height:       Weight change: 4.3 kg  Intake/Output Summary (Last 24 hours) at 09/04/2018 0920 Last data filed at 09/04/2018 0400 Gross per 24 hour  Intake 820 ml  Output 4400 ml  Net -3580 ml    Dialyzes at [facility]- MWF  EDW 53.5. HD Bath 2/2.5, Dialyzer ?, Heparin 1000/1400. Access right AVF. 400 BFR/800 DFR- 3 hours.  4000 epo and 2.5 of hectorol   Assessment/Plan: 58 year old BM with ESRD, COPD- many issues of late with SOB- now dx with PNA and seems like volume overloaded as well  1 SOB- CXR with PNA and fluid.  On abx (vanc /cefepime) s/p HD yest and today to see if can get down to his EDW or possibly even under if he has lost weight- also to get back on his regular HD schedule 2 ESRD: normally MWF- has been compliant per OP HD unit.  Ran yesterday and today to really try to get fluid off- supposedly 9 kg over his EDW yest.  he likely needs a longer dialysis time but I am sure will not consent as an OP- only so much can pull during a treatment and it can build up- may have lost body weight and need a lower EDW.  He agrees to run today again 3 Hypertension: malignant on many meds- not sure if compliant with meds.  Could also have volume component as well.  BP did not drop with tx yest, will see if it will today- have started a profile  4. Anemia of ESRD: not terrible- over 11- gets epo 4000 per treatment - will give darbe here low dose 60  mcg 5. Metabolic Bone Disease: cont hectorol 2.5 per tx, to dose today - says RD bothers him regarding phos- no binder listed on med list - giving [place]    Labs: Basic Metabolic Panel: Recent Labs  Lab 09/02/18 1325 09/04/18 0442  NA 135 135  K 5.6* 4.1  CL 103 96*  CO2 19* 27  GLUCOSE 104* 86  BUN 50* 19  CREATININE 12.74* 7.19*  CALCIUM 8.7* 8.4*   Liver Function Tests: Recent Labs  Lab 09/02/18 1325  AST 17  ALT 12  ALKPHOS 111  BILITOT 0.5  PROT 7.1  ALBUMIN 2.7*   No results for input(s): LIPASE, AMYLASE in the last 168 hours. No results for input(s): AMMONIA in the last 168 hours. CBC: Recent Labs  Lab 09/02/18 1325 09/04/18 0442  WBC 10.9* 8.8  NEUTROABS 8.6*  --   HGB 10.8* 11.3*  HCT 34.9* 36.5*  MCV 107.1* 105.5*  PLT 502* 472*   Cardiac Enzymes: Recent Labs  Lab 09/02/18 1325  TROPONINI <0.03   CBG: No results for input(s): GLUCAP in the last 168 hours.  Iron Studies: No results for input(s): IRON, TIBC, TRANSFERRIN, FERRITIN in the last 72 hours. Studies/Results: Dg Chest 2 View  Result Date: 09/04/2018 CLINICAL DATA:  Shortness of breath. EXAM: CHEST - 2 VIEW COMPARISON:  09/02/2018 FINDINGS: The cardiomediastinal silhouette is unchanged with normal heart size. Right middle lobe opacity is unchanged. Central interstitial prominence on the prior study is stable to slightly decreased. There is scarring in the right greater than left lung apices. There is a new small right pleural effusion. No pneumothorax is identified. No acute osseous abnormality is seen. IMPRESSION: 1. New small right pleural effusion. 2. Unchanged right middle lobe opacity compatible with pneumonia. Electronically Signed   By: Logan Bores M.D.   On: 09/04/2018 08:33   Dg Chest 2 View  Result Date: 09/02/2018 CLINICAL DATA:  Diagnosis flu 2 weeks ago.  Shortness of breath. EXAM: CHEST - 2 VIEW COMPARISON:  August 23, 2018 FINDINGS: No  pneumothorax. The heart, hila, and mediastinum are normal. Right middle lobe opacity is identified suggestive of pneumonia given history. Mild increased interstitial markings centrally in the lungs. No other acute abnormalities. IMPRESSION: 1. Right middle lobe infiltrate, likely pneumonia given history. Recommend follow-up to resolution. 2. Mild increased interstitial markings centrally may represent pulmonary venous congestion or atypical infection. Recommend attention on follow-up. Electronically Signed   By: Dorise Bullion III M.D   On: 09/02/2018 13:25   Medications: Infusions: . sodium chloride    . sodium chloride    . ceFEPime (MAXIPIME) IV 1 g (09/04/18 0042)    Scheduled Medications: . amLODipine  10 mg Oral Daily  . calcium acetate  1,334 mg Oral TID WC  . carvedilol  25 mg Oral BID WC  . Chlorhexidine Gluconate Cloth  6 each Topical Q0600  . Chlorhexidine Gluconate Cloth  6 each Topical Q0600  . cycloSPORINE  1 drop Both Eyes BID  . darbepoetin (ARANESP) injection - DIALYSIS  60 mcg Intravenous Q Wed-HD  . doxercalciferol  2.5 mcg Intravenous Q M,W,F-HD  . guaiFENesin  600 mg Oral BID  . guaiFENesin-dextromethorphan  10 mL Oral Q8H  . heparin  5,000 Units Subcutaneous Q8H  . hydrALAZINE  25 mg Oral TID  . latanoprost  1 drop Both Eyes QHS  . mometasone-formoterol  2 puff Inhalation BID  . traMADol  50 mg Oral BID    have reviewed scheduled and prn medications.  Physical Exam: General: NAD- many complaints Heart: RRR- tachy Lungs: CBS bilat Abdomen: soft, non tender Extremities: min edema Dialysis Access: right AVF- patent     09/04/2018,9:20 AM  LOS: 1 day

## 2018-09-04 NOTE — Discharge Summary (Signed)
Physician Discharge Summary  Luke Mueller URK:[id number] DOB: 08-May-1961 DOA: 09/02/2018  PCP: Rosita Fire, MD  Admit date: 09/02/2018 Discharge date: 09/04/2018  Admitted From: Home Disposition:  Home   Recommendations for Outpatient Follow-up:  1. Follow up with PCP in 1-2 weeks 2. Please obtain BMP/CBC in one week    Discharge Condition: Stable CODE STATUS: FULL Diet recommendation: renal   Brief/Interim Summary: 58 year old male with a history of COPD, tobacco abuse, tension, PVD, ESRD(MWF)presenting with difficulty breathing and cough productive of yellowish-green sputum of 2 days duration.  He reports chills without fevers.  He has some mild lower central chest pain over the past 2 days with coughing.  He denies lower extremity swelling.  Pt was diagnosed with influenza on 08/23/18.  He finished 5 days tamiflu.  CXR showed RML opacity and increased interstitial markings.  He received zosyn in ED and then he was started on cefepime and vanco.  Initially the patient was 91% on RA.  Renal was consulted and pt received ful HD sessions on 2/25 and 2/26.  He improved clinically and was 97-98% on RA.  He will d/c with levofloxacin, one more dose on 2/28 which will complete 7 days of tx.  Discharge Diagnoses:  Lobar Pneumonia (HCAP) -received 3 days IV abx -stable on RA -afebrile and hemodynamically stable -levoflox 750 mg x 1 on 2/26, then one more dose (500mg ) on 2/28  Malignant hypertension -As the patient continues to speak, it is somewhat evident that he has noncompliance with his medications -Continue amlodipine, carvedilol, hydralazine -Blood pressure much improved during hospitalization with systolic blood pressure in the 160s -carvedilol to 25 mg twice daily  ESRD -Hemodialysis Monday, Wednesday, Friday -had HD 2/25 and 2/26 -Nephrology appreicated  Tobacco abuse -Tobacco cessation discussed -I have discussed tobacco cessation with the patient. I have  counseled the patient regarding the negative impacts of continued tobacco use including but not limited to lung cancer, COPD, and cardiovascular disease. I have discussed alternatives to tobacco and modalities that may help facilitate tobacco cessation including but not limited to biofeedback, hypnosis, and medications. Total time spent with tobacco counseling was 4 minutes.  COPD -Appears stable without wheezing at this time -Continue duo nebs   Discharge Instructions   Allergies as of 09/04/2018      Reactions   Lotrel [amlodipine Besy-benazepril Hcl] Swelling   Lips swelling      Medication List    TAKE these medications   albuterol 108 (90 Base) MCG/ACT inhaler Commonly known as:  PROVENTIL HFA;VENTOLIN HFA Inhale 2 puffs into the lungs every 6 (six) hours as needed for wheezing or shortness of breath.   amLODipine 10 MG tablet Commonly known as:  NORVASC Take 10 mg by mouth daily.   budesonide-formoterol 160-4.5 MCG/ACT inhaler Commonly known as:  SYMBICORT Inhale 2 puffs into the lungs 2 (two) times daily.   carvedilol 25 MG tablet Commonly known as:  COREG Take 1 tablet (25 mg total) by mouth 2 (two) times daily with a meal.   cyclobenzaprine 5 MG tablet Commonly known as:  FLEXERIL Take 5 mg by mouth 2 (two) times daily as needed for muscle spasms.   hydrALAZINE 25 MG tablet Commonly known as:  APRESOLINE Take 25 mg by mouth 3 (three) times daily.   latanoprost 0.005 % ophthalmic solution Commonly known as:  XALATAN Place 1 drop into both eyes at bedtime.   levofloxacin 500 MG tablet Commonly known as:  LEVAQUIN Take 1 tablet (500 mg total)  by mouth once for 1 dose. After dialysis on 09/06/18 Start taking on:  September 06, 2018   multivitamin Tabs tablet Take 1 tablet by mouth daily.   omeprazole 20 MG capsule Commonly known as:  PRILOSEC Take 20 mg by mouth daily.   RESTASIS 0.05 % ophthalmic emulsion Generic drug:  cycloSPORINE Place 1 drop  into both eyes 2 (two) times daily.   traMADol 50 MG tablet Commonly known as:  ULTRAM Take 1 tablet by mouth 2 (two) times daily.       Allergies  Allergen Reactions  . Lotrel [Amlodipine Besy-Benazepril Hcl] Swelling    Lips swelling    Consultations:  renal   Procedures/Studies: Dg Chest 2 View  Result Date: 09/04/2018 CLINICAL DATA:  Shortness of breath. EXAM: CHEST - 2 VIEW COMPARISON:  09/02/2018 FINDINGS: The cardiomediastinal silhouette is unchanged with normal heart size. Right middle lobe opacity is unchanged. Central interstitial prominence on the prior study is stable to slightly decreased. There is scarring in the right greater than left lung apices. There is a new small right pleural effusion. No pneumothorax is identified. No acute osseous abnormality is seen. IMPRESSION: 1. New small right pleural effusion. 2. Unchanged right middle lobe opacity compatible with pneumonia. Electronically Signed   By: Logan Bores M.D.   On: 09/04/2018 08:33   Dg Chest 2 View  Result Date: 09/02/2018 CLINICAL DATA:  Diagnosis flu 2 weeks ago.  Shortness of breath. EXAM: CHEST - 2 VIEW COMPARISON:  August 23, 2018 FINDINGS: No pneumothorax. The heart, hila, and mediastinum are normal. Right middle lobe opacity is identified suggestive of pneumonia given history. Mild increased interstitial markings centrally in the lungs. No other acute abnormalities. IMPRESSION: 1. Right middle lobe infiltrate, likely pneumonia given history. Recommend follow-up to resolution. 2. Mild increased interstitial markings centrally may represent pulmonary venous congestion or atypical infection. Recommend attention on follow-up. Electronically Signed   By: Dorise Bullion III M.D   On: 09/02/2018 13:25   Dg Chest 2 View  Result Date: 08/23/2018 CLINICAL DATA:  Fever and cough. EXAM: CHEST - 2 VIEW COMPARISON:  08/08/2018 and 07/23/2018 and 03/06/2018 FINDINGS: Heart size and vascularity are normal. There is  slight peribronchial thickening but there are no infiltrates or effusions. Focal linear atelectasis at the right lung base posteriorly. No acute bone abnormality. IMPRESSION: Slight bronchitic changes. Focal linear atelectasis at the right lung base posteriorly Electronically Signed   By: Lorriane Shire M.D.   On: 08/23/2018 17:27   Dg Chest 2 View  Result Date: 08/09/2018 CLINICAL DATA:  58 year old male with a history of respiratory failure previously EXAM: CHEST - 2 VIEW COMPARISON:  07/23/2018, 03/06/2018 FINDINGS: Cardiomediastinal silhouette unchanged in size and contour. Stigmata of emphysema, with increased retrosternal airspace, flattened hemidiaphragms, increased AP diameter, and hyperinflation on the AP view. Coarsened interstitial markings. Similar appearance of pleuroparenchymal thickening at the apices. Improved aeration compared to the prior plain film. No pneumothorax or new confluent airspace disease. Calcifications of the coronary arteries. No displaced fracture.  Degenerative changes. IMPRESSION: Improved aeration, evidence of clearing infection. Chronic lung changes and emphysema. Electronically Signed   By: Corrie Mckusick D.O.   On: 08/09/2018 10:04        Discharge Exam: Vitals:   09/04/18 1130 09/04/18 1200  BP: (!) 140/92 114/76  Pulse: (!) 112 (!) 109  Resp:    Temp:    SpO2:     Vitals:   09/04/18 1040 09/04/18 1100 09/04/18 1130 09/04/18 1200  BP: (!) 157/97 (!) 163/97 (!) 140/92 114/76  Pulse: 100 93 (!) 112 (!) 109  Resp:      Temp:      TempSrc:      SpO2:      Weight:      Height:        General: Pt is alert, awake, not in acute distress Cardiovascular: RRR, S1/S2 +, no rubs, no gallops Respiratory: right basilar crackles, no wheeze Abdominal: Soft, NT, ND, bowel sounds + Extremities: no edema, no cyanosis   The results of significant diagnostics from this hospitalization (including imaging, microbiology, ancillary and laboratory) are listed below  for reference.    Significant Diagnostic Studies: Dg Chest 2 View  Result Date: 09/04/2018 CLINICAL DATA:  Shortness of breath. EXAM: CHEST - 2 VIEW COMPARISON:  09/02/2018 FINDINGS: The cardiomediastinal silhouette is unchanged with normal heart size. Right middle lobe opacity is unchanged. Central interstitial prominence on the prior study is stable to slightly decreased. There is scarring in the right greater than left lung apices. There is a new small right pleural effusion. No pneumothorax is identified. No acute osseous abnormality is seen. IMPRESSION: 1. New small right pleural effusion. 2. Unchanged right middle lobe opacity compatible with pneumonia. Electronically Signed   By: Logan Bores M.D.   On: 09/04/2018 08:33   Dg Chest 2 View  Result Date: 09/02/2018 CLINICAL DATA:  Diagnosis flu 2 weeks ago.  Shortness of breath. EXAM: CHEST - 2 VIEW COMPARISON:  August 23, 2018 FINDINGS: No pneumothorax. The heart, hila, and mediastinum are normal. Right middle lobe opacity is identified suggestive of pneumonia given history. Mild increased interstitial markings centrally in the lungs. No other acute abnormalities. IMPRESSION: 1. Right middle lobe infiltrate, likely pneumonia given history. Recommend follow-up to resolution. 2. Mild increased interstitial markings centrally may represent pulmonary venous congestion or atypical infection. Recommend attention on follow-up. Electronically Signed   By: Dorise Bullion III M.D   On: 09/02/2018 13:25   Dg Chest 2 View  Result Date: 08/23/2018 CLINICAL DATA:  Fever and cough. EXAM: CHEST - 2 VIEW COMPARISON:  08/08/2018 and 07/23/2018 and 03/06/2018 FINDINGS: Heart size and vascularity are normal. There is slight peribronchial thickening but there are no infiltrates or effusions. Focal linear atelectasis at the right lung base posteriorly. No acute bone abnormality. IMPRESSION: Slight bronchitic changes. Focal linear atelectasis at the right lung base  posteriorly Electronically Signed   By: Lorriane Shire M.D.   On: 08/23/2018 17:27   Dg Chest 2 View  Result Date: 08/09/2018 CLINICAL DATA:  57 year old male with a history of respiratory failure previously EXAM: CHEST - 2 VIEW COMPARISON:  07/23/2018, 03/06/2018 FINDINGS: Cardiomediastinal silhouette unchanged in size and contour. Stigmata of emphysema, with increased retrosternal airspace, flattened hemidiaphragms, increased AP diameter, and hyperinflation on the AP view. Coarsened interstitial markings. Similar appearance of pleuroparenchymal thickening at the apices. Improved aeration compared to the prior plain film. No pneumothorax or new confluent airspace disease. Calcifications of the coronary arteries. No displaced fracture.  Degenerative changes. IMPRESSION: Improved aeration, evidence of clearing infection. Chronic lung changes and emphysema. Electronically Signed   By: Corrie Mckusick D.O.   On: 08/09/2018 10:04     Microbiology: Recent Results (from the past 240 hour(s))  Culture, blood (Routine X 2) w Reflex to ID Panel     Status: None (Preliminary result)   Collection Time: 09/02/18  1:25 PM  Result Value Ref Range Status   Specimen Description BLOOD LEFT ANTECUBITAL  Final   Special Requests   Final    BOTTLES DRAWN AEROBIC AND ANAEROBIC Blood Culture adequate volume   Culture   Final    NO GROWTH 2 DAYS Performed at [facility], [address], [place], [place] [address]    Report Status PENDING  Incomplete  Culture, blood (Routine X 2) w Reflex to ID Panel     Status: None (Preliminary result)   Collection Time: 09/02/18  2:51 PM  Result Value Ref Range Status   Specimen Description BLOOD LEFT FOREARM  Final   Special Requests   Final    BOTTLES DRAWN AEROBIC AND ANAEROBIC Blood Culture adequate volume   Culture   Final    NO GROWTH 2 DAYS Performed at [facility], [address] [place], [place] [address]    Report Status PENDING  Incomplete  MRSA PCR Screening      Status: None   Collection Time: 09/02/18  5:33 PM  Result Value Ref Range Status   MRSA by PCR NEGATIVE NEGATIVE Final    Comment:        The GeneXpert MRSA Assay (FDA approved for NASAL specimens only), is one component of a comprehensive MRSA colonization surveillance program. It is not intended to diagnose MRSA infection nor to guide or monitor treatment for MRSA infections. Performed at [facility], [address], [place],  [address]      Labs: Basic Metabolic Panel: Recent Labs  Lab 09/02/18 1325 09/04/18 0442  NA 135 135  K 5.6* 4.1  CL 103 96*  CO2 19* 27  GLUCOSE 104* 86  BUN 50* 19  CREATININE 12.74* 7.19*  CALCIUM 8.7* 8.4*   Liver Function Tests: Recent Labs  Lab 09/02/18 1325  AST 17  ALT 12  ALKPHOS 111  BILITOT 0.5  PROT 7.1  ALBUMIN 2.7*   No results for input(s): LIPASE, AMYLASE in the last 168 hours. No results for input(s): AMMONIA in the last 168 hours. CBC: Recent Labs  Lab 09/02/18 1325 09/04/18 0442  WBC 10.9* 8.8  NEUTROABS 8.6*  --   HGB 10.8* 11.3*  HCT 34.9* 36.5*  MCV 107.1* 105.5*  PLT 502* 472*   Cardiac Enzymes: Recent Labs  Lab 09/02/18 1325  TROPONINI <0.03   BNP: Invalid input(s): POCBNP CBG: No results for input(s): GLUCAP in the last 168 hours.  Time coordinating discharge:  36 minutes  Signed:  Orson Eva, DO [facility] Pager: [phone] 09/04/2018, 12:21 PM

## 2018-09-04 NOTE — Progress Notes (Signed)
Blood pressure meds held due to dialysis that patient will get in a few hours. Verified with Shelly Flatten RN. Will continue to monitor.

## 2018-09-05 ENCOUNTER — Other Ambulatory Visit: Payer: Self-pay

## 2018-09-05 DIAGNOSIS — J441 Chronic obstructive pulmonary disease with (acute) exacerbation: Secondary | ICD-10-CM

## 2018-09-05 DIAGNOSIS — J189 Pneumonia, unspecified organism: Secondary | ICD-10-CM

## 2018-09-05 NOTE — Patient Outreach (Signed)
[place] [facility]) Care Management  09/05/2018  Luke Mueller June 08, 1961 [id number]   Referral received from [place] for complex case management.  Initial outreach unsuccessful. Phone rang several times without option to leave a voice message.  PLAN Will send unsuccessful outreach letter. Will attempt outreach within 3-4 business days.   [place] [phone]

## 2018-09-06 DIAGNOSIS — N2581 Secondary hyperparathyroidism of renal origin: Secondary | ICD-10-CM | POA: Diagnosis not present

## 2018-09-06 DIAGNOSIS — D509 Iron deficiency anemia, unspecified: Secondary | ICD-10-CM | POA: Diagnosis not present

## 2018-09-06 DIAGNOSIS — N186 End stage renal disease: Secondary | ICD-10-CM | POA: Diagnosis not present

## 2018-09-06 DIAGNOSIS — Z992 Dependence on renal dialysis: Secondary | ICD-10-CM | POA: Diagnosis not present

## 2018-09-06 DIAGNOSIS — D631 Anemia in chronic kidney disease: Secondary | ICD-10-CM | POA: Diagnosis not present

## 2018-09-07 DIAGNOSIS — N186 End stage renal disease: Secondary | ICD-10-CM | POA: Diagnosis not present

## 2018-09-07 DIAGNOSIS — Z992 Dependence on renal dialysis: Secondary | ICD-10-CM | POA: Diagnosis not present

## 2018-09-07 LAB — CULTURE, BLOOD (ROUTINE X 2)
Culture: NO GROWTH
Culture: NO GROWTH
SPECIAL REQUESTS: ADEQUATE
Special Requests: ADEQUATE

## 2018-09-09 DIAGNOSIS — D631 Anemia in chronic kidney disease: Secondary | ICD-10-CM | POA: Diagnosis not present

## 2018-09-09 DIAGNOSIS — N186 End stage renal disease: Secondary | ICD-10-CM | POA: Diagnosis not present

## 2018-09-09 DIAGNOSIS — D509 Iron deficiency anemia, unspecified: Secondary | ICD-10-CM | POA: Diagnosis not present

## 2018-09-09 DIAGNOSIS — Z992 Dependence on renal dialysis: Secondary | ICD-10-CM | POA: Diagnosis not present

## 2018-09-09 DIAGNOSIS — N2581 Secondary hyperparathyroidism of renal origin: Secondary | ICD-10-CM | POA: Diagnosis not present

## 2018-09-11 ENCOUNTER — Other Ambulatory Visit: Payer: Self-pay

## 2018-09-11 DIAGNOSIS — N186 End stage renal disease: Secondary | ICD-10-CM | POA: Diagnosis not present

## 2018-09-11 DIAGNOSIS — N2581 Secondary hyperparathyroidism of renal origin: Secondary | ICD-10-CM | POA: Diagnosis not present

## 2018-09-11 DIAGNOSIS — Z992 Dependence on renal dialysis: Secondary | ICD-10-CM | POA: Diagnosis not present

## 2018-09-11 DIAGNOSIS — D509 Iron deficiency anemia, unspecified: Secondary | ICD-10-CM | POA: Diagnosis not present

## 2018-09-11 DIAGNOSIS — D631 Anemia in chronic kidney disease: Secondary | ICD-10-CM | POA: Diagnosis not present

## 2018-09-11 NOTE — Patient Outreach (Signed)
[place] [facility]) Care Management  09/11/2018  Luke Mueller 12-03-1960 [id number]  Attempted outreach with Mr. Luke Mueller. He reported preparing for dialysis and requested a call back.    PLAN Will follow up later this week.   [place] [phone]

## 2018-09-12 ENCOUNTER — Other Ambulatory Visit: Payer: Self-pay

## 2018-09-12 NOTE — Patient Outreach (Signed)
[place] [facility]) Care Management  09/12/2018  SHARIF RENDELL August 26, 1960 [id number]   Attempted follow up outreach with Mr. Thielen. Phone rang multiple times without option to leave voice message.   PLAN Will attempt outreach within 3-4 business days.   [place] [phone]

## 2018-09-13 DIAGNOSIS — D631 Anemia in chronic kidney disease: Secondary | ICD-10-CM | POA: Diagnosis not present

## 2018-09-13 DIAGNOSIS — Z992 Dependence on renal dialysis: Secondary | ICD-10-CM | POA: Diagnosis not present

## 2018-09-13 DIAGNOSIS — D509 Iron deficiency anemia, unspecified: Secondary | ICD-10-CM | POA: Diagnosis not present

## 2018-09-13 DIAGNOSIS — N186 End stage renal disease: Secondary | ICD-10-CM | POA: Diagnosis not present

## 2018-09-13 DIAGNOSIS — N2581 Secondary hyperparathyroidism of renal origin: Secondary | ICD-10-CM | POA: Diagnosis not present

## 2018-09-16 DIAGNOSIS — Z992 Dependence on renal dialysis: Secondary | ICD-10-CM | POA: Diagnosis not present

## 2018-09-16 DIAGNOSIS — D509 Iron deficiency anemia, unspecified: Secondary | ICD-10-CM | POA: Diagnosis not present

## 2018-09-16 DIAGNOSIS — D631 Anemia in chronic kidney disease: Secondary | ICD-10-CM | POA: Diagnosis not present

## 2018-09-16 DIAGNOSIS — N186 End stage renal disease: Secondary | ICD-10-CM | POA: Diagnosis not present

## 2018-09-16 DIAGNOSIS — N2581 Secondary hyperparathyroidism of renal origin: Secondary | ICD-10-CM | POA: Diagnosis not present

## 2018-09-17 ENCOUNTER — Other Ambulatory Visit: Payer: Self-pay

## 2018-09-17 NOTE — Patient Outreach (Signed)
[place] [facility]) Care Management  09/17/2018  Luke Mueller [birth year] [id number]    Unsuccessful outreach attempt. Phone rang multiple times without option to leave a voice message.   PLAN  Will attempt outreach within 3-4 business days.   [place] [phone]

## 2018-09-18 DIAGNOSIS — D631 Anemia in chronic kidney disease: Secondary | ICD-10-CM | POA: Diagnosis not present

## 2018-09-18 DIAGNOSIS — N2581 Secondary hyperparathyroidism of renal origin: Secondary | ICD-10-CM | POA: Diagnosis not present

## 2018-09-18 DIAGNOSIS — D509 Iron deficiency anemia, unspecified: Secondary | ICD-10-CM | POA: Diagnosis not present

## 2018-09-18 DIAGNOSIS — Z992 Dependence on renal dialysis: Secondary | ICD-10-CM | POA: Diagnosis not present

## 2018-09-18 DIAGNOSIS — N186 End stage renal disease: Secondary | ICD-10-CM | POA: Diagnosis not present

## 2018-09-19 DIAGNOSIS — I10 Essential (primary) hypertension: Secondary | ICD-10-CM | POA: Diagnosis not present

## 2018-09-19 DIAGNOSIS — F17210 Nicotine dependence, cigarettes, uncomplicated: Secondary | ICD-10-CM | POA: Diagnosis not present

## 2018-09-19 DIAGNOSIS — I739 Peripheral vascular disease, unspecified: Secondary | ICD-10-CM | POA: Diagnosis not present

## 2018-09-19 DIAGNOSIS — N186 End stage renal disease: Secondary | ICD-10-CM | POA: Diagnosis not present

## 2018-09-19 DIAGNOSIS — E43 Unspecified severe protein-calorie malnutrition: Secondary | ICD-10-CM | POA: Diagnosis not present

## 2018-09-20 ENCOUNTER — Other Ambulatory Visit: Payer: Self-pay

## 2018-09-20 DIAGNOSIS — Z992 Dependence on renal dialysis: Secondary | ICD-10-CM | POA: Diagnosis not present

## 2018-09-20 DIAGNOSIS — D631 Anemia in chronic kidney disease: Secondary | ICD-10-CM | POA: Diagnosis not present

## 2018-09-20 DIAGNOSIS — N2581 Secondary hyperparathyroidism of renal origin: Secondary | ICD-10-CM | POA: Diagnosis not present

## 2018-09-20 DIAGNOSIS — D509 Iron deficiency anemia, unspecified: Secondary | ICD-10-CM | POA: Diagnosis not present

## 2018-09-20 DIAGNOSIS — N186 End stage renal disease: Secondary | ICD-10-CM | POA: Diagnosis not present

## 2018-09-23 DIAGNOSIS — Z992 Dependence on renal dialysis: Secondary | ICD-10-CM | POA: Diagnosis not present

## 2018-09-23 DIAGNOSIS — N186 End stage renal disease: Secondary | ICD-10-CM | POA: Diagnosis not present

## 2018-09-23 DIAGNOSIS — N2581 Secondary hyperparathyroidism of renal origin: Secondary | ICD-10-CM | POA: Diagnosis not present

## 2018-09-23 DIAGNOSIS — D631 Anemia in chronic kidney disease: Secondary | ICD-10-CM | POA: Diagnosis not present

## 2018-09-23 DIAGNOSIS — D509 Iron deficiency anemia, unspecified: Secondary | ICD-10-CM | POA: Diagnosis not present

## 2018-09-23 NOTE — Patient Outreach (Signed)
[place] [facility]) Care Management   TAJAE RYBICKI 08-07-1960 [id number]  Attempted outreach with Mr. Pagnotta. Call answered by family member. Reported that Mr. Herendeen was not available and receiving dialysis at the time of the call.   PLAN Will attempt to reach member within 3-4 business days.   [place] [phone]

## 2018-09-24 ENCOUNTER — Other Ambulatory Visit: Payer: Self-pay

## 2018-09-24 NOTE — Patient Outreach (Signed)
[place] [facility]) Care Management  09/24/2018  DEMICHAEL TRAUM [birth year] [id number]     CASE CLOSURE [facility] Care Management case closed due to Mr. Pelzer not being interested in services. Case closure letters faxed to Dr. Legrand Rams and mailed to member.   [place] [phone]

## 2018-09-25 DIAGNOSIS — N186 End stage renal disease: Secondary | ICD-10-CM | POA: Diagnosis not present

## 2018-09-25 DIAGNOSIS — D509 Iron deficiency anemia, unspecified: Secondary | ICD-10-CM | POA: Diagnosis not present

## 2018-09-25 DIAGNOSIS — D631 Anemia in chronic kidney disease: Secondary | ICD-10-CM | POA: Diagnosis not present

## 2018-09-25 DIAGNOSIS — N2581 Secondary hyperparathyroidism of renal origin: Secondary | ICD-10-CM | POA: Diagnosis not present

## 2018-09-25 DIAGNOSIS — Z992 Dependence on renal dialysis: Secondary | ICD-10-CM | POA: Diagnosis not present

## 2018-09-27 DIAGNOSIS — N186 End stage renal disease: Secondary | ICD-10-CM | POA: Diagnosis not present

## 2018-09-27 DIAGNOSIS — D631 Anemia in chronic kidney disease: Secondary | ICD-10-CM | POA: Diagnosis not present

## 2018-09-27 DIAGNOSIS — N2581 Secondary hyperparathyroidism of renal origin: Secondary | ICD-10-CM | POA: Diagnosis not present

## 2018-09-27 DIAGNOSIS — D509 Iron deficiency anemia, unspecified: Secondary | ICD-10-CM | POA: Diagnosis not present

## 2018-09-27 DIAGNOSIS — Z992 Dependence on renal dialysis: Secondary | ICD-10-CM | POA: Diagnosis not present

## 2018-09-30 ENCOUNTER — Other Ambulatory Visit: Payer: Self-pay

## 2018-09-30 ENCOUNTER — Emergency Department (HOSPITAL_COMMUNITY): Payer: Medicare Other

## 2018-09-30 ENCOUNTER — Inpatient Hospital Stay (HOSPITAL_COMMUNITY)
Admission: EM | Admit: 2018-09-30 | Discharge: 2018-10-02 | DRG: 189 | Disposition: A | Discharge status: Home / Self Care | Payer: Medicare Other | Attending: Internal Medicine | Admitting: Internal Medicine

## 2018-09-30 ENCOUNTER — Encounter (HOSPITAL_COMMUNITY): Payer: Self-pay | Admitting: *Deleted

## 2018-09-30 DIAGNOSIS — Z7951 Long term (current) use of inhaled steroids: Secondary | ICD-10-CM

## 2018-09-30 DIAGNOSIS — I129 Hypertensive chronic kidney disease with stage 1 through stage 4 chronic kidney disease, or unspecified chronic kidney disease: Secondary | ICD-10-CM | POA: Diagnosis not present

## 2018-09-30 DIAGNOSIS — R61 Generalized hyperhidrosis: Secondary | ICD-10-CM | POA: Diagnosis present

## 2018-09-30 DIAGNOSIS — Z79899 Other long term (current) drug therapy: Secondary | ICD-10-CM

## 2018-09-30 DIAGNOSIS — Z888 Allergy status to other drugs, medicaments and biological substances status: Secondary | ICD-10-CM

## 2018-09-30 DIAGNOSIS — Z9115 Patient's noncompliance with renal dialysis: Secondary | ICD-10-CM

## 2018-09-30 DIAGNOSIS — Z9114 Patient's other noncompliance with medication regimen: Secondary | ICD-10-CM

## 2018-09-30 DIAGNOSIS — K219 Gastro-esophageal reflux disease without esophagitis: Secondary | ICD-10-CM | POA: Diagnosis present

## 2018-09-30 DIAGNOSIS — F17200 Nicotine dependence, unspecified, uncomplicated: Secondary | ICD-10-CM | POA: Diagnosis present

## 2018-09-30 DIAGNOSIS — Z992 Dependence on renal dialysis: Secondary | ICD-10-CM

## 2018-09-30 DIAGNOSIS — D72829 Elevated white blood cell count, unspecified: Secondary | ICD-10-CM | POA: Diagnosis present

## 2018-09-30 DIAGNOSIS — J811 Chronic pulmonary edema: Secondary | ICD-10-CM | POA: Diagnosis not present

## 2018-09-30 DIAGNOSIS — I10 Essential (primary) hypertension: Secondary | ICD-10-CM | POA: Diagnosis present

## 2018-09-30 DIAGNOSIS — I120 Hypertensive chronic kidney disease with stage 5 chronic kidney disease or end stage renal disease: Secondary | ICD-10-CM | POA: Diagnosis present

## 2018-09-30 DIAGNOSIS — R0602 Shortness of breath: Secondary | ICD-10-CM | POA: Diagnosis not present

## 2018-09-30 DIAGNOSIS — W06XXXA Fall from bed, initial encounter: Secondary | ICD-10-CM | POA: Diagnosis present

## 2018-09-30 DIAGNOSIS — N186 End stage renal disease: Secondary | ICD-10-CM | POA: Diagnosis not present

## 2018-09-30 DIAGNOSIS — J9601 Acute respiratory failure with hypoxia: Secondary | ICD-10-CM | POA: Diagnosis not present

## 2018-09-30 DIAGNOSIS — J441 Chronic obstructive pulmonary disease with (acute) exacerbation: Secondary | ICD-10-CM | POA: Diagnosis present

## 2018-09-30 DIAGNOSIS — D631 Anemia in chronic kidney disease: Secondary | ICD-10-CM | POA: Diagnosis present

## 2018-09-30 DIAGNOSIS — I739 Peripheral vascular disease, unspecified: Secondary | ICD-10-CM | POA: Diagnosis present

## 2018-09-30 DIAGNOSIS — Z8249 Family history of ischemic heart disease and other diseases of the circulatory system: Secondary | ICD-10-CM

## 2018-09-30 DIAGNOSIS — E8770 Fluid overload, unspecified: Secondary | ICD-10-CM | POA: Diagnosis present

## 2018-09-30 MED ORDER — ALBUTEROL SULFATE (2.5 MG/3ML) 0.083% IN NEBU
5.0000 mg | INHALATION_SOLUTION | Freq: Once | RESPIRATORY_TRACT | Status: AC
  Administered 2018-09-30: 5 mg via RESPIRATORY_TRACT
  Filled 2018-09-30: qty 6

## 2018-09-30 NOTE — ED Triage Notes (Signed)
Pt c/o sob that has been getting progressively worse since 6pm today; pt states he feels tight in his chest

## 2018-10-01 ENCOUNTER — Other Ambulatory Visit: Payer: Self-pay

## 2018-10-01 DIAGNOSIS — J9601 Acute respiratory failure with hypoxia: Secondary | ICD-10-CM | POA: Diagnosis present

## 2018-10-01 DIAGNOSIS — Z9114 Patient's other noncompliance with medication regimen: Secondary | ICD-10-CM | POA: Diagnosis not present

## 2018-10-01 DIAGNOSIS — N186 End stage renal disease: Secondary | ICD-10-CM | POA: Diagnosis present

## 2018-10-01 DIAGNOSIS — Z8249 Family history of ischemic heart disease and other diseases of the circulatory system: Secondary | ICD-10-CM | POA: Diagnosis not present

## 2018-10-01 DIAGNOSIS — E8770 Fluid overload, unspecified: Secondary | ICD-10-CM | POA: Diagnosis present

## 2018-10-01 DIAGNOSIS — D631 Anemia in chronic kidney disease: Secondary | ICD-10-CM | POA: Diagnosis present

## 2018-10-01 DIAGNOSIS — K219 Gastro-esophageal reflux disease without esophagitis: Secondary | ICD-10-CM | POA: Diagnosis present

## 2018-10-01 DIAGNOSIS — J441 Chronic obstructive pulmonary disease with (acute) exacerbation: Secondary | ICD-10-CM | POA: Diagnosis present

## 2018-10-01 DIAGNOSIS — I739 Peripheral vascular disease, unspecified: Secondary | ICD-10-CM | POA: Diagnosis present

## 2018-10-01 DIAGNOSIS — R61 Generalized hyperhidrosis: Secondary | ICD-10-CM | POA: Diagnosis present

## 2018-10-01 DIAGNOSIS — I10 Essential (primary) hypertension: Secondary | ICD-10-CM | POA: Diagnosis not present

## 2018-10-01 DIAGNOSIS — J810 Acute pulmonary edema: Secondary | ICD-10-CM

## 2018-10-01 DIAGNOSIS — J811 Chronic pulmonary edema: Secondary | ICD-10-CM | POA: Diagnosis present

## 2018-10-01 DIAGNOSIS — I120 Hypertensive chronic kidney disease with stage 5 chronic kidney disease or end stage renal disease: Secondary | ICD-10-CM | POA: Diagnosis not present

## 2018-10-01 DIAGNOSIS — R0602 Shortness of breath: Secondary | ICD-10-CM | POA: Diagnosis not present

## 2018-10-01 DIAGNOSIS — Z79899 Other long term (current) drug therapy: Secondary | ICD-10-CM | POA: Diagnosis not present

## 2018-10-01 DIAGNOSIS — Z9115 Patient's noncompliance with renal dialysis: Secondary | ICD-10-CM | POA: Diagnosis not present

## 2018-10-01 DIAGNOSIS — W06XXXA Fall from bed, initial encounter: Secondary | ICD-10-CM | POA: Diagnosis present

## 2018-10-01 DIAGNOSIS — Z7951 Long term (current) use of inhaled steroids: Secondary | ICD-10-CM | POA: Diagnosis not present

## 2018-10-01 DIAGNOSIS — Z992 Dependence on renal dialysis: Secondary | ICD-10-CM | POA: Diagnosis not present

## 2018-10-01 DIAGNOSIS — Z888 Allergy status to other drugs, medicaments and biological substances status: Secondary | ICD-10-CM | POA: Diagnosis not present

## 2018-10-01 DIAGNOSIS — D72829 Elevated white blood cell count, unspecified: Secondary | ICD-10-CM | POA: Diagnosis present

## 2018-10-01 DIAGNOSIS — F17200 Nicotine dependence, unspecified, uncomplicated: Secondary | ICD-10-CM | POA: Diagnosis present

## 2018-10-01 LAB — BASIC METABOLIC PANEL
Anion gap: 13 (ref 5–15)
BUN: 79 mg/dL — ABNORMAL HIGH (ref 6–20)
CO2: 17 mmol/L — AB (ref 22–32)
Calcium: 8.7 mg/dL — ABNORMAL LOW (ref 8.9–10.3)
Chloride: 110 mmol/L (ref 98–111)
Creatinine, Ser: 12.16 mg/dL — ABNORMAL HIGH (ref 0.61–1.24)
GFR calc Af Amer: 5 mL/min — ABNORMAL LOW (ref >60)
GFR calc non Af Amer: 4 mL/min — ABNORMAL LOW (ref >60)
Glucose, Bld: 144 mg/dL — ABNORMAL HIGH (ref 70–99)
Potassium: 4.8 mmol/L (ref 3.5–5.1)
Sodium: 140 mmol/L (ref 135–145)

## 2018-10-01 LAB — TROPONIN I
Troponin I: <0.03 ng/mL (ref <0.03)
Troponin I: <0.03 ng/mL (ref <0.03)
Troponin I: <0.03 ng/mL (ref <0.03)

## 2018-10-01 LAB — CBC WITH DIFFERENTIAL/PLATELET
ABS IMMATURE GRANULOCYTES: 0.04 10*3/uL (ref 0.00–0.07)
Basophils Absolute: 0.1 10*3/uL (ref 0.0–0.1)
Basophils Relative: 1 %
Eosinophils Absolute: 0.1 10*3/uL (ref 0.0–0.5)
Eosinophils Relative: 1 %
HCT: 34.1 % — ABNORMAL LOW (ref 39.0–52.0)
Hemoglobin: 10.8 g/dL — ABNORMAL LOW (ref 13.0–17.0)
IMMATURE GRANULOCYTES: 0 %
Lymphocytes Relative: 15 %
Lymphs Abs: 2.1 10*3/uL (ref 0.7–4.0)
MCH: 34.3 pg — ABNORMAL HIGH (ref 26.0–34.0)
MCHC: 31.7 g/dL (ref 30.0–36.0)
MCV: 108.3 fL — ABNORMAL HIGH (ref 80.0–100.0)
Monocytes Absolute: 0.7 10*3/uL (ref 0.1–1.0)
Monocytes Relative: 5 %
Neutro Abs: 11.4 10*3/uL — ABNORMAL HIGH (ref 1.7–7.7)
Neutrophils Relative %: 78 %
Platelets: 238 10*3/uL (ref 150–400)
RBC: 3.15 MIL/uL — ABNORMAL LOW (ref 4.22–5.81)
RDW: 14.2 % (ref 11.5–15.5)
WBC: 14.5 10*3/uL — ABNORMAL HIGH (ref 4.0–10.5)
nRBC: 0 % (ref 0.0–0.2)

## 2018-10-01 LAB — MRSA PCR SCREENING: MRSA by PCR: NEGATIVE

## 2018-10-01 MED ORDER — SODIUM BICARBONATE 8.4 % IV SOLN
50.0000 meq | Freq: Once | INTRAVENOUS | Status: AC
  Administered 2018-10-01: 50 meq via INTRAVENOUS
  Filled 2018-10-01: qty 50

## 2018-10-01 MED ORDER — LIDOCAINE HCL (PF) 1 % IJ SOLN: 5.0000 mL | INTRAMUSCULAR | Status: DC | PRN

## 2018-10-01 MED ORDER — CHLORHEXIDINE GLUCONATE CLOTH 2 % EX PADS
6.0000 | MEDICATED_PAD | Freq: Every day | CUTANEOUS | Status: DC
  Administered 2018-10-02: 6 via TOPICAL

## 2018-10-01 MED ORDER — STERILE WATER FOR INJECTION IV SOLN: Freq: Once | INTRAVENOUS | Status: DC

## 2018-10-01 MED ORDER — CYCLOBENZAPRINE HCL 10 MG PO TABS
5.0000 mg | ORAL_TABLET | Freq: Once | ORAL | Status: AC
  Administered 2018-10-01: 5 mg via ORAL
  Filled 2018-10-01: qty 1

## 2018-10-01 MED ORDER — CALCIUM GLUCONATE 10 % IV SOLN
INTRAVENOUS | Status: AC
  Administered 2018-10-01: 1 g via INTRAVENOUS
  Filled 2018-10-01: qty 10

## 2018-10-01 MED ORDER — CALCIUM GLUCONATE 10 % IV SOLN
1.0000 g | Freq: Once | INTRAVENOUS | Status: AC
  Administered 2018-10-01: 1 g via INTRAVENOUS

## 2018-10-01 MED ORDER — RENA-VITE PO TABS
1.0000 | ORAL_TABLET | Freq: Every day | ORAL | Status: DC
  Administered 2018-10-01 – 2018-10-02 (×2): 1 via ORAL
  Filled 2018-10-01 (×2): qty 1

## 2018-10-01 MED ORDER — CHLORHEXIDINE GLUCONATE 0.12 % MT SOLN
15.0000 mL | Freq: Two times a day (BID) | OROMUCOSAL | Status: DC
  Administered 2018-10-01 – 2018-10-02 (×2): 15 mL via OROMUCOSAL
  Filled 2018-10-01 (×2): qty 15

## 2018-10-01 MED ORDER — DOXERCALCIFEROL 4 MCG/2ML IV SOLN
2.5000 ug | INTRAVENOUS | Status: DC
  Administered 2018-10-01: 2.5 ug via INTRAVENOUS
  Filled 2018-10-01 (×3): qty 2

## 2018-10-01 MED ORDER — DEXTROSE 50 % IV SOLN
INTRAVENOUS | Status: AC
  Administered 2018-10-01: 50 mL via INTRAVENOUS
  Filled 2018-10-01: qty 50

## 2018-10-01 MED ORDER — ORAL CARE MOUTH RINSE
15.0000 mL | Freq: Two times a day (BID) | OROMUCOSAL | Status: DC
  Administered 2018-10-01 – 2018-10-02 (×3): 15 mL via OROMUCOSAL

## 2018-10-01 MED ORDER — ACETAMINOPHEN 325 MG PO TABS: 650.0000 mg | ORAL_TABLET | Freq: Four times a day (QID) | ORAL | Status: DC | PRN

## 2018-10-01 MED ORDER — NITROGLYCERIN 2 % TD OINT
1.0000 [in_us] | TOPICAL_OINTMENT | Freq: Once | TRANSDERMAL | Status: AC
  Administered 2018-10-01: 1 [in_us] via TOPICAL
  Filled 2018-10-01: qty 1

## 2018-10-01 MED ORDER — PANTOPRAZOLE SODIUM 40 MG PO TBEC
40.0000 mg | DELAYED_RELEASE_TABLET | Freq: Every day | ORAL | Status: DC
  Administered 2018-10-01 – 2018-10-02 (×2): 40 mg via ORAL
  Filled 2018-10-01 (×2): qty 1

## 2018-10-01 MED ORDER — DEXTROSE 50 % IV SOLN
50.0000 mL | Freq: Once | INTRAVENOUS | Status: AC
  Administered 2018-10-01: 50 mL via INTRAVENOUS

## 2018-10-01 MED ORDER — MORPHINE SULFATE (PF) 4 MG/ML IV SOLN
4.0000 mg | INTRAVENOUS | Status: DC | PRN
  Administered 2018-10-01: 4 mg via INTRAVENOUS
  Filled 2018-10-01: qty 1

## 2018-10-01 MED ORDER — LATANOPROST 0.005 % OP SOLN
1.0000 [drp] | Freq: Every day | OPHTHALMIC | Status: DC
  Administered 2018-10-01: 1 [drp] via OPHTHALMIC
  Filled 2018-10-01: qty 2.5

## 2018-10-01 MED ORDER — HYDRALAZINE HCL 25 MG PO TABS
25.0000 mg | ORAL_TABLET | Freq: Three times a day (TID) | ORAL | Status: DC
  Administered 2018-10-01 – 2018-10-02 (×5): 25 mg via ORAL
  Filled 2018-10-01 (×5): qty 1

## 2018-10-01 MED ORDER — HYDRALAZINE HCL 20 MG/ML IJ SOLN
20.0000 mg | INTRAMUSCULAR | Status: DC | PRN
  Administered 2018-10-01 – 2018-10-02 (×4): 20 mg via INTRAVENOUS
  Filled 2018-10-01 (×5): qty 1

## 2018-10-01 MED ORDER — HYDRALAZINE HCL 20 MG/ML IJ SOLN
5.0000 mg | Freq: Once | INTRAMUSCULAR | Status: AC
  Administered 2018-10-01: 5 mg via INTRAVENOUS
  Filled 2018-10-01: qty 1

## 2018-10-01 MED ORDER — IPRATROPIUM-ALBUTEROL 0.5-2.5 (3) MG/3ML IN SOLN: 3.0000 mL | RESPIRATORY_TRACT | Status: DC | PRN

## 2018-10-01 MED ORDER — SODIUM CHLORIDE 0.9 % IV SOLN: 100.0000 mL | INTRAVENOUS | Status: DC | PRN

## 2018-10-01 MED ORDER — CYCLOSPORINE 0.05 % OP EMUL
1.0000 [drp] | Freq: Two times a day (BID) | OPHTHALMIC | Status: DC
  Administered 2018-10-01 – 2018-10-02 (×2): 1 [drp] via OPHTHALMIC
  Filled 2018-10-01 (×2): qty 30

## 2018-10-01 MED ORDER — ALBUTEROL (5 MG/ML) CONTINUOUS INHALATION SOLN
10.0000 mg/h | INHALATION_SOLUTION | RESPIRATORY_TRACT | Status: DC
  Administered 2018-10-01: 10 mg/h via RESPIRATORY_TRACT
  Filled 2018-10-01: qty 20

## 2018-10-01 MED ORDER — DARBEPOETIN ALFA 40 MCG/0.4ML IJ SOSY
40.0000 ug | PREFILLED_SYRINGE | Freq: Once | INTRAMUSCULAR | Status: DC
  Filled 2018-10-01: qty 0.4

## 2018-10-01 MED ORDER — SODIUM CHLORIDE 0.9 % IV SOLN
1.0000 g | Freq: Once | INTRAVENOUS | Status: DC
  Filled 2018-10-01: qty 10

## 2018-10-01 MED ORDER — LIDOCAINE-PRILOCAINE 2.5-2.5 % EX CREA: 1.0000 "application " | TOPICAL_CREAM | CUTANEOUS | Status: DC | PRN

## 2018-10-01 MED ORDER — HEPARIN SODIUM (PORCINE) 1000 UNIT/ML DIALYSIS
1000.0000 [IU] | INTRAMUSCULAR | Status: DC | PRN
  Administered 2018-10-01: 1000 [IU] via INTRAVENOUS_CENTRAL
  Filled 2018-10-01 (×3): qty 1

## 2018-10-01 MED ORDER — ACETAMINOPHEN 650 MG RE SUPP: 650.0000 mg | Freq: Four times a day (QID) | RECTAL | Status: DC | PRN

## 2018-10-01 MED ORDER — HEPARIN SODIUM (PORCINE) 1000 UNIT/ML DIALYSIS
1000.0000 [IU] | INTRAMUSCULAR | Status: DC | PRN
  Filled 2018-10-01 (×3): qty 1

## 2018-10-01 MED ORDER — AMLODIPINE BESYLATE 5 MG PO TABS
10.0000 mg | ORAL_TABLET | Freq: Every day | ORAL | Status: DC
  Administered 2018-10-01 – 2018-10-02 (×2): 10 mg via ORAL
  Filled 2018-10-01 (×2): qty 2

## 2018-10-01 MED ORDER — INSULIN ASPART 100 UNIT/ML ~~LOC~~ SOLN
10.0000 [IU] | Freq: Once | SUBCUTANEOUS | Status: AC
  Administered 2018-10-01: 10 [IU] via INTRAVENOUS
  Filled 2018-10-01: qty 1

## 2018-10-01 MED ORDER — PENTAFLUOROPROP-TETRAFLUOROETH EX AERO: 1.0000 "application " | INHALATION_SPRAY | CUTANEOUS | Status: DC | PRN

## 2018-10-01 MED ORDER — MOMETASONE FURO-FORMOTEROL FUM 200-5 MCG/ACT IN AERO
2.0000 | INHALATION_SPRAY | Freq: Two times a day (BID) | RESPIRATORY_TRACT | Status: DC
  Administered 2018-10-01 – 2018-10-02 (×2): 2 via RESPIRATORY_TRACT
  Filled 2018-10-01: qty 8.8

## 2018-10-01 NOTE — Progress Notes (Signed)
Acute resp failure with hypoxia due to vascular congestion and pulmonary edema. Patient is ESRD on chronic HD and missed treatment as an outpatient. Requiring BIPAP to assist with his breathing currently and with elevated HTN in the setting of fluid overload. Please refer to H&P written by Dr. Olevia Bowens for further info/details on admission.  Plan: -needs acute HD treatment -nephrology consult -wean oxygen supplementation as tolerated -follow clinical response.  Barton Dubois MD [phone]

## 2018-10-01 NOTE — ED Notes (Addendum)
Pt returned from xr. Pt is diaphoretic and  80% on 5l [place]. bp elevated as well.  ekg repeated. edp to room
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
## 2018-10-01 NOTE — ED Notes (Signed)
Called ac for meds
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
## 2018-10-01 NOTE — ED Notes (Signed)
Patient returned from xr
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
## 2018-10-01 NOTE — Procedures (Signed)
     HEMODIALYSIS TREATMENT NOTE:  3.5 hour low-heparin dialysis completed via right forearm AVF (15g/antegrade). Goal met: 4 liters removed without interruption in ultrafiltration.  All blood was returned and hemostasis was achieved in 15 minutes.    Rockwell Alexandria, RN

## 2018-10-01 NOTE — TOC Initial Note (Signed)
Transition of Care [facility]) - Initial/Assessment Note    Patient Details  Name: Luke Mueller MRN: [id number] Date of Birth: [birth year]  [facility]) CM/SW Contact:    Akash Winski Dimitri Ped, LCSW Phone Number: 10/01/2018, 5:02 PM  Clinical Narrative:    CSW visited Pt to conduct TOC assessment and to discuss discharge plans. Pt explained the situation that occurred that led to him being admitted. Pt was cooperative and engaged with with CSW. Pt stated that prior to being admitted, he lived home with his mother, whom of which he supports. Pt shares that he is able to ambulate, perform ADLS, and live independently without the use of any DME. Pt denies the need for [facility] upon discharge as well. Pt explains that he uses [facility] for means of transportation to dialysis. Pt is able to drive to his doctor appointments as well. Pt adds that he has dialysis 3x weekly: Monday, Wednesday and Thursday. Pt reports that dialysis is frustrating for him and causes an inconvenience in his life. When asked about his natural supports, Pt shares that he has no supports and nobody to call on for help; although Pt has a daughter, she is not local and able to assist him if needed.   Pt reports that his PCP is Dr. Legrand Rams, and that he also sees a pulmonologist and kidney specialist regularly as recommended.   CSW will continue to follow up on Pt's discharge needs. [place] Worker   Expected Discharge Plan: Home/Self Care Barriers to Discharge: No Barriers Identified   Patient Goals and CMS Choice Patient states their goals for this hospitalization and ongoing recovery are:: return home with dialysis    Choice offered to / list presented to : NA  Expected Discharge Plan and Services Expected Discharge Plan: Home/Self Care In-house Referral: NA Discharge Planning Services: NA Post Acute Care Choice: Dialysis Living arrangements for the past 2 months: Single Family Home      DME Agency: NA HH Arranged: Refused [place] Agency: NA  Prior Living Arrangements/Services Living arrangements for the past 2 months: Single Family Home Lives with:: Relatives Patient language and need for interpreter reviewed:: No Do you feel safe going back to the place where you live?: Yes      Need for Family Participation in Patient Care: No (Comment) Care giver support system in place?: No (comment)   Criminal Activity/Legal Involvement Pertinent to Current Situation/Hospitalization: No - Comment as needed  Activities of Daily Living Home Assistive Devices/Equipment: None ADL Screening (condition at time of admission) Patient's cognitive ability adequate to safely complete daily activities?: Yes Is the patient deaf or have difficulty hearing?: No Does the patient have difficulty seeing, even when wearing glasses/contacts?: No Does the patient have difficulty concentrating, remembering, or making decisions?: No Patient able to express need for assistance with ADLs?: Yes Does the patient have difficulty dressing or bathing?: No Independently performs ADLs?: Yes (appropriate for developmental age) Does the patient have difficulty walking or climbing stairs?: No Weakness of Legs: None Weakness of Arms/Hands: None  Permission Sought/Granted Permission sought to share information with : Case Manager Permission granted to share information with : Yes, Verbal Permission Granted              Emotional Assessment Appearance:: Appears stated age Attitude/Demeanor/Rapport: Engaged Affect (typically observed): Accepting, Appropriate, Frustrated Orientation: : Oriented to Self, Oriented to  Time, Oriented to Place, Oriented to Situation   Psych Involvement: No (comment)  Admission diagnosis:  COPD exacerbation ([place]) [J44.1] ESRD needing dialysis ([place]) [N18.6, Z99.2] Essential hypertension [I10] Patient Active Problem List   Diagnosis Date Noted  . Volume overload  10/01/2018  . Anemia in ESRD (end-stage renal disease) ([place]) 10/01/2018  . Leukocytosis 10/01/2018  . Lobar pneumonia ([place]) 09/04/2018  . HCAP (healthcare-associated pneumonia)   . Pneumonia 09/02/2018  . ESRD (end stage renal disease) ([place]) 07/24/2018  . Nonspecific chest pain 07/24/2018  . Hyperkalemia 07/24/2018  . Acute respiratory failure with hypoxia ([place]) 07/24/2018  . Tobacco abuse   . Pulmonary edema 07/23/2018  . End stage renal disease ([place]) 04/08/2013  . PAD (peripheral artery disease) ([place]) 11/22/2012  . Claudication of right lower extremity ([place]) 11/22/2012  . CKD (chronic kidney disease) stage 4, GFR 15-29 ml/min (HCC) 11/22/2012  . COPD (chronic obstructive pulmonary disease) ([place]) 11/22/2012  . HTN (hypertension), malignant 11/22/2012   PCP:  Rosita Fire, MD Pharmacy:   [facility], [address] [place] [address] Phone: [phone] Fax: [phone]  [id number] - [place], [place] [address] [place] [address] Phone: [phone] Fax: [phone]     Social Determinants of Health (SDOH) Interventions    Readmission Risk Interventions No flowsheet data found.

## 2018-10-01 NOTE — ED Notes (Addendum)
RT to room

## 2018-10-01 NOTE — ED Provider Notes (Signed)
Patient presents tonight with shortness of breath after missing his dialysis appointment today.  When I saw him around 12:35 AM patient is noted to be diaphoretic and having difficulty breathing.  He had had 1 albuterol nebulizer.  His blood pressure was 161 systolic.  When I listen to him he still has some bilateral scattered wheezing.  Patient was placed on BiPAP, he got continuous nebulizer with albuterol, when I look at his EKG he has marked peaked T waves, he was started on the hyperkalemia protocol.  For his hypertension he was given nitroglycerin paste.  Recheck 1 AM patient is on BiPAP.  He has his nitroglycerin paste in place.  He is getting his continuous nebulizer.  Patient is still a little clammy however he is not diaphoretic like he was.  When I listen to his lungs he has improved air movement and has some rhonchi but the wheezing is gone.  Patient is just now getting his hyperkalemia medication.  Medical screening examination/treatment/procedure(s) were conducted as a shared visit with non-physician practitioner(s) and myself.  I personally evaluated the patient during the encounter.  EKG Interpretation  Date/Time:  Monday September 30 2018 23:40:21 EDT Ventricular Rate:  90 PR Interval:    QRS Duration: 94 QT Interval:  383 QTC Calculation: 469 R Axis:   17 Text Interpretation:  Sinus rhythm Borderline ST elevation, anterior leads peaked T waves Confirmed by Rolland Porter [phone]) on 10/01/2018 12:05:19 AM    EKG Interpretation  Date/Time:  Tuesday October 01 2018 00:12:44 EDT Ventricular Rate:  97 PR Interval:    QRS Duration: 88 QT Interval:  379 QTC Calculation: 482 R Axis:   51 Text Interpretation:  Sinus rhythm Consider anterior infarct ST elevation, consider inferior injury Baseline wander in lead(s) II III aVR aVL aVF V3 V4 V5 V6 peaked T waves Confirmed by Rolland Porter [phone]) on 10/01/2018 12:50:54 AM       EKG Interpretation  Date/Time:  Tuesday October 01 2018 00:29:09  EDT Ventricular Rate:  92 PR Interval:    QRS Duration: 93 QT Interval:  392 QTC Calculation: 485 R Axis:   67 Text Interpretation:  Sinus rhythm Consider left atrial enlargement Consider anterior infarct peaked T waves  Electrode noise Confirmed by Rolland Porter [phone]) on 10/01/2018 12:51:41 AM       EKG Interpretation  Date/Time:  Tuesday October 01 2018 00:30:14 EDT Ventricular Rate:  90 PR Interval:    QRS Duration: 88 QT Interval:  384 QTC Calculation: 470 R Axis:   61 Text Interpretation:  Sinus rhythm Borderline prolonged PR interval Consider anterior infarct Baseline wander in lead(s) II III aVF peaked T waves Confirmed by Rolland Porter [phone]) on 10/01/2018 12:52:33 AM      Results for orders placed or performed during the hospital encounter of 62/13/08  Basic metabolic panel  Result Value Ref Range   Sodium 140 135 - 145 mmol/L   Potassium 4.8 3.5 - 5.1 mmol/L   Chloride 110 98 - 111 mmol/L   CO2 17 (L) 22 - 32 mmol/L   Glucose, Bld 144 (H) 70 - 99 mg/dL   BUN 79 (H) 6 - 20 mg/dL   Creatinine, Ser 12.16 (H) 0.61 - 1.24 mg/dL   Calcium 8.7 (L) 8.9 - 10.3 mg/dL   GFR calc non Af Amer 4 (L) >60 mL/min   GFR calc Af Amer 5 (L) >60 mL/min   Anion gap 13 5 - 15  Troponin I - Once  Result Value Ref  Range   Troponin I <0.03 <0.03 ng/mL  CBC with Differential  Result Value Ref Range   WBC 14.5 (H) 4.0 - 10.5 K/uL   RBC 3.15 (L) 4.22 - 5.81 MIL/uL   Hemoglobin 10.8 (L) 13.0 - 17.0 g/dL   HCT 34.1 (L) 39.0 - 52.0 %   MCV 108.3 (H) 80.0 - 100.0 fL   MCH 34.3 (H) 26.0 - 34.0 pg   MCHC 31.7 30.0 - 36.0 g/dL   RDW 14.2 11.5 - 15.5 %   Platelets 238 150 - 400 K/uL   nRBC 0.0 0.0 - 0.2 %   Neutrophils Relative % 78 %   Neutro Abs 11.4 (H) 1.7 - 7.7 K/uL   Lymphocytes Relative 15 %   Lymphs Abs 2.1 0.7 - 4.0 K/uL   Monocytes Relative 5 %   Monocytes Absolute 0.7 0.1 - 1.0 K/uL   Eosinophils Relative 1 %   Eosinophils Absolute 0.1 0.0 - 0.5 K/uL   Basophils Relative 1 %    Basophils Absolute 0.1 0.0 - 0.1 K/uL   Immature Granulocytes 0 %   Abs Immature Granulocytes 0.04 0.00 - 0.07 K/uL    Laboratory interpretation all normal except renal failure, chronic, leukocytosis, stable anemia   Dg Chest 2 View  Result Date: 10/01/2018 CLINICAL DATA:  Shortness of breath EXAM: CHEST - 2 VIEW COMPARISON:  09/04/2018 FINDINGS: New bibasilar opacities compared to the prior study, worse in the right lower lobe. Small pleural effusions, decreased from the prior study. No pneumothorax. Lungs are hyperexpanded. IMPRESSION: 1. New bibasilar opacities, right greater than left, favoring interstitial pulmonary edema over pneumonia. 2. COPD. Electronically Signed   By: Ulyses Jarred M.D.   On: 10/01/2018 00:40    01:26 AM Dr Hollie Salk, Nephrology, states will arrange for dialysis in the morning, can stay here, if he gets worse she can call in the dialysis nurse emergently  01:52 AM Dr Olevia Bowens, hospitalist, will admit  Diagnoses that have been ruled out:  None  Diagnoses that are still under consideration:  None  Final diagnoses:  ESRD needing dialysis [facility])  Essential hypertension  COPD exacerbation [facility])   Plan admission  Rolland Porter, MD, Barbette Or, MD 10/01/18 [phone]

## 2018-10-01 NOTE — Procedures (Signed)
I was present at this dialysis session. I have reviewed the session itself and made appropriate changes.   Goal 4 L using right upper extremity AV fistula, tolerating treatment well.  Filed Weights   09/30/18 2336 10/01/18 0300 10/01/18 0830  Weight: 54.4 kg 58.5 kg 58.5 kg    Recent Labs  Lab 10/01/18 0040  NA 140  K 4.8  CL 110  CO2 17*  GLUCOSE 144*  BUN 79*  CREATININE 12.16*  CALCIUM 8.7*    Recent Labs  Lab 10/01/18 0040  WBC 14.5*  NEUTROABS 11.4*  HGB 10.8*  HCT 34.1*  MCV 108.3*  PLT 238    Scheduled Meds: . chlorhexidine  15 mL Mouth Rinse BID  . Chlorhexidine Gluconate Cloth  6 each Topical Q0600  . Darbepoetin Alfa  40 mcg Intravenous Once  . doxercalciferol  2.5 mcg Intravenous Q T,Th,Sa-HD  . mouth rinse  15 mL Mouth Rinse q12n4p   Continuous Infusions: . sodium chloride    . sodium chloride     PRN Meds:.sodium chloride, sodium chloride, acetaminophen **OR** acetaminophen, heparin, heparin, hydrALAZINE, ipratropium-albuterol, lidocaine (PF), lidocaine-prilocaine, morphine injection, pentafluoroprop-tetrafluoroeth   Pearson Grippe  MD 10/01/2018, 10:21 AM

## 2018-10-01 NOTE — Progress Notes (Signed)
Patient moved from ED to ICU 5 without any complications.

## 2018-10-02 DIAGNOSIS — I10 Essential (primary) hypertension: Secondary | ICD-10-CM

## 2018-10-02 DIAGNOSIS — D631 Anemia in chronic kidney disease: Secondary | ICD-10-CM

## 2018-10-02 LAB — HEPATITIS B SURFACE ANTIGEN: Hepatitis B Surface Ag: NEGATIVE

## 2018-10-02 LAB — BASIC METABOLIC PANEL
Anion gap: 7 (ref 5–15)
BUN: 31 mg/dL — ABNORMAL HIGH (ref 6–20)
CO2: 25 mmol/L (ref 22–32)
Calcium: 8.6 mg/dL — ABNORMAL LOW (ref 8.9–10.3)
Chloride: 104 mmol/L (ref 98–111)
Creatinine, Ser: 7.35 mg/dL — ABNORMAL HIGH (ref 0.61–1.24)
GFR calc Af Amer: 9 mL/min — ABNORMAL LOW (ref >60)
GFR calc non Af Amer: 7 mL/min — ABNORMAL LOW (ref >60)
Glucose, Bld: 108 mg/dL — ABNORMAL HIGH (ref 70–99)
Potassium: 4.4 mmol/L (ref 3.5–5.1)
Sodium: 136 mmol/L (ref 135–145)

## 2018-10-02 LAB — MAGNESIUM: Magnesium: 2.2 mg/dL (ref 1.7–2.4)

## 2018-10-02 MED ORDER — LABETALOL HCL 5 MG/ML IV SOLN
20.0000 mg | INTRAVENOUS | Status: DC | PRN
  Administered 2018-10-02 (×2): 20 mg via INTRAVENOUS
  Filled 2018-10-02 (×2): qty 4

## 2018-10-02 MED ORDER — HEPARIN SODIUM (PORCINE) 1000 UNIT/ML DIALYSIS: 20.0000 [IU]/kg | INTRAMUSCULAR | Status: DC | PRN

## 2018-10-02 NOTE — Progress Notes (Signed)
Admit: 09/30/2018 LOS: 48  58 year old male ESRD presenting with acute hypertension, volume overload, hypoxic respiratory failure after missing routine HD on 3/23.  Subjective:  . HD yesterday, 4L UF, weened ot RA, BPs up but not as severe . No c/o this AM   03/24 0701 - 03/25 0700 In: 840 [P.O.:840] Out: 4000   Filed Weights   10/01/18 0300 10/01/18 0830 10/02/18 0501  Weight: 58.5 kg 58.5 kg 58 kg    Scheduled Meds: . amLODipine  10 mg Oral Daily  . chlorhexidine  15 mL Mouth Rinse BID  . Chlorhexidine Gluconate Cloth  6 each Topical Q0600  . cycloSPORINE  1 drop Both Eyes BID  . Darbepoetin Alfa  40 mcg Intravenous Once  . doxercalciferol  2.5 mcg Intravenous Q T,Th,Sa-HD  . hydrALAZINE  25 mg Oral TID  . latanoprost  1 drop Both Eyes QHS  . mouth rinse  15 mL Mouth Rinse q12n4p  . mometasone-formoterol  2 puff Inhalation BID  . multivitamin  1 tablet Oral Daily  . pantoprazole  40 mg Oral Daily   Continuous Infusions: . sodium chloride    . sodium chloride     PRN Meds:.sodium chloride, sodium chloride, acetaminophen **OR** acetaminophen, heparin, heparin, hydrALAZINE, ipratropium-albuterol, labetalol, lidocaine (PF), lidocaine-prilocaine, morphine injection, pentafluoroprop-tetrafluoroeth  Current Labs: reviewed    Physical Exam:  Blood pressure (!) 171/89, pulse 80, temperature 99 F (37.2 C), temperature source Oral, resp. rate 19, height 5\' 10"  (1.778 m), weight 58 kg, SpO2 100 %. GEN: No acute distress, sitting in bed,  ENT: NCAT, poor dentition EYES: EOMI CV: normal rate, regular, normal S1 and S2, no rub PULM: Improved bs, coarse in bases still, speaks full sentences, nl wob ABD: Soft, nontender SKIN: No rashes or lesions EXT: No peripheral edema RUE AV[place] +B[place]  [place] [facility] [facility] ? 53.5. HD Bath2/2.5, Dialyzer?, Heparin1000/1400. AccessrightAVF.400 BFR/800 DFR- 3 hours. 4000 epo and 2.5 of hectorol   A 1. ESRD qMWF via  right upper arm AV fistula: HD again today back on schedule, 3-4L UF as able 2. Dialysis noncompliance, discussed with patient critical need to attend full treatments as an outpatient to minimize exposure to the emergency room in hospital given current circumstances regarding COVID19 3. Anemia: Hemoglobin appears stable 4. Hypertension, improved, resumed home meds, follow with UF today; might need med adjustment as outpatient P . As above HD today . If stable post HD ok for discharge . Medication Issues; o Preferred narcotic agents for pain control are hydromorphone, fentanyl, and methadone. Morphine should not be used.  o Baclofen should be avoided o Avoid oral sodium phosphate and magnesium citrate based laxatives / bowel preps    Pearson Grippe MD 10/02/2018, 10:37 AM  Recent Labs  Lab 10/01/18 0040 10/02/18 0459  NA 140 136  K 4.8 4.4  CL 110 104  CO2 17* 25  GLUCOSE 144* 108*  BUN 79* 31*  CREATININE 12.16* 7.35*  CALCIUM 8.7* 8.6*   Recent Labs  Lab 10/01/18 0040  WBC 14.5*  NEUTROABS 11.4*  HGB 10.8*  HCT 34.1*  MCV 108.3*  PLT 238

## 2018-10-02 NOTE — Discharge Summary (Signed)
Physician Discharge Summary  ANES RIGEL TSV:[id number] DOB: [birth year] DOA: 09/30/2018  PCP: Rosita Fire, MD  Admit date: 09/30/2018 Discharge date: 10/02/2018  Admitted From: Home Disposition:  Home   Recommendations for Outpatient Follow-up:  1. Follow up with PCP in 1-2 weeks 2. Please obtain BMP/CBC in one week    Discharge Condition: Stable CODE STATUS: FULL Diet recommendation: Renal   Brief/Interim Summary: 58 year old male with a history of COPD, tobacco abuse, tension, PVD, ESRD(MWF)presenting with difficulty breathing after missing HD on 09/30/18.  The patient has a well documented history of poor compliance with fluid intake and medications at home.  He denied any f/c, n/v/d or sick contacts.  In the ED, the patient developed respiratory failure requiring BiPAP.  He was afebrile with BP up to 204/104.    He has had numerous admissions with similar presentations.  Nephrology was consulted to assist.  Patient was dialyzed on 3/24 and 3/25.  He was subsequently weaned to RA.  His BP improved with restarting his meds but was not yet optimal.  The patient remained stable after HD on 3/25 and he was cleared for discharge.  Discharge Diagnoses:  Acute respiratory failure with hypoxia -due to pulmonary edema -initially on BiPAP -weaned to RA after 2 consecutive days of HD  Malignant hypertension -As the patient continues to speak, it becomes evident that he hasnoncompliance with his medications -Continue amlodipine, carvedilol, hydralazine -Blood pressure much improved during hospitalization with systolic blood pressure in the 120s on HD -with each hospitalization, his BP is always better controlled during the hospitalization  ESRD -Hemodialysis Monday, Wednesday, Friday -had HD 3/24 and 3/25 -Nephrology appreicated -discussed with pt to avoid ED at all costs given risks of current coronavirus pandemic  Tobacco abuse -Tobacco cessation discussed -I have  discussed tobacco cessation with the patient. I have counseled the patient regarding the negative impacts of continued tobacco use including but not limited to lung cancer, COPD, and cardiovascular disease. I have discussed alternatives to tobacco and modalities that may help facilitate tobacco cessation including but not limited to biofeedback, hypnosis, and medications. Total time spent with tobacco counseling was 4 minutes.  COPD -stable without wheezing at this time -Continue duo nebs -restart symbicort after d/c   Discharge Instructions   Allergies as of 10/02/2018      Reactions   Lotrel [amlodipine Besy-benazepril Hcl] Swelling   Lips swelling      Medication List    TAKE these medications   albuterol 108 (90 Base) MCG/ACT inhaler Commonly known as:  PROVENTIL HFA;VENTOLIN HFA Inhale 2 puffs into the lungs every 6 (six) hours as needed for wheezing or shortness of breath.   amLODipine 10 MG tablet Commonly known as:  NORVASC Take 10 mg by mouth daily.   budesonide-formoterol 160-4.5 MCG/ACT inhaler Commonly known as:  SYMBICORT Inhale 2 puffs into the lungs 2 (two) times daily.   carvedilol 25 MG tablet Commonly known as:  COREG Take 1 tablet (25 mg total) by mouth 2 (two) times daily with a meal.   cyclobenzaprine 10 MG tablet Commonly known as:  FLEXERIL Take 10 mg by mouth 2 (two) times daily.   hydrALAZINE 25 MG tablet Commonly known as:  APRESOLINE Take 25 mg by mouth 3 (three) times daily.   multivitamin Tabs tablet Take 1 tablet by mouth daily.   omeprazole 20 MG capsule Commonly known as:  PRILOSEC Take 20 mg by mouth daily.   Restasis 0.05 % ophthalmic emulsion Generic drug:  cycloSPORINE  Place 1 drop into both eyes 2 (two) times daily.   traMADol 50 MG tablet Commonly known as:  ULTRAM Take 1 tablet by mouth 2 (two) times daily as needed for moderate pain.       Allergies  Allergen Reactions  . Lotrel [Amlodipine Besy-Benazepril Hcl]  Swelling    Lips swelling    Consultations:  nephrology   Procedures/Studies: Dg Chest 2 View  Result Date: 10/01/2018 CLINICAL DATA:  Shortness of breath EXAM: CHEST - 2 VIEW COMPARISON:  09/04/2018 FINDINGS: New bibasilar opacities compared to the prior study, worse in the right lower lobe. Small pleural effusions, decreased from the prior study. No pneumothorax. Lungs are hyperexpanded. IMPRESSION: 1. New bibasilar opacities, right greater than left, favoring interstitial pulmonary edema over pneumonia. 2. COPD. Electronically Signed   By: Ulyses Jarred M.D.   On: 10/01/2018 00:40   Dg Chest 2 View  Result Date: 09/04/2018 CLINICAL DATA:  Shortness of breath. EXAM: CHEST - 2 VIEW COMPARISON:  09/02/2018 FINDINGS: The cardiomediastinal silhouette is unchanged with normal heart size. Right middle lobe opacity is unchanged. Central interstitial prominence on the prior study is stable to slightly decreased. There is scarring in the right greater than left lung apices. There is a new small right pleural effusion. No pneumothorax is identified. No acute osseous abnormality is seen. IMPRESSION: 1. New small right pleural effusion. 2. Unchanged right middle lobe opacity compatible with pneumonia. Electronically Signed   By: Logan Bores M.D.   On: 09/04/2018 08:33        Discharge Exam: Vitals:   10/02/18 1600 10/02/18 1615  BP: (!) 152/90 (!) 139/93  Pulse: 95 95  Resp:    Temp:    SpO2:     Vitals:   10/02/18 1530 10/02/18 1545 10/02/18 1600 10/02/18 1615  BP: 140/84 116/79 (!) 152/90 (!) 139/93  Pulse: 99 91 95 95  Resp:      Temp:      TempSrc:      SpO2:      Weight:      Height:        General: Pt is alert, awake, not in acute distress Cardiovascular: RRR, S1/S2 +, no rubs, no gallops Respiratory: fine bibasilar crackles, no wheeze Abdominal: Soft, NT, ND, bowel sounds + Extremities: no edema, no cyanosis   The results of significant diagnostics from this  hospitalization (including imaging, microbiology, ancillary and laboratory) are listed below for reference.    Significant Diagnostic Studies: Dg Chest 2 View  Result Date: 10/01/2018 CLINICAL DATA:  Shortness of breath EXAM: CHEST - 2 VIEW COMPARISON:  09/04/2018 FINDINGS: New bibasilar opacities compared to the prior study, worse in the right lower lobe. Small pleural effusions, decreased from the prior study. No pneumothorax. Lungs are hyperexpanded. IMPRESSION: 1. New bibasilar opacities, right greater than left, favoring interstitial pulmonary edema over pneumonia. 2. COPD. Electronically Signed   By: Ulyses Jarred M.D.   On: 10/01/2018 00:40   Dg Chest 2 View  Result Date: 09/04/2018 CLINICAL DATA:  Shortness of breath. EXAM: CHEST - 2 VIEW COMPARISON:  09/02/2018 FINDINGS: The cardiomediastinal silhouette is unchanged with normal heart size. Right middle lobe opacity is unchanged. Central interstitial prominence on the prior study is stable to slightly decreased. There is scarring in the right greater than left lung apices. There is a new small right pleural effusion. No pneumothorax is identified. No acute osseous abnormality is seen. IMPRESSION: 1. New small right pleural effusion. 2. Unchanged right middle lobe  opacity compatible with pneumonia. Electronically Signed   By: Logan Bores M.D.   On: 09/04/2018 08:33     Microbiology: Recent Results (from the past 240 hour(s))  MRSA PCR Screening     Status: None   Collection Time: 10/01/18  2:35 AM  Result Value Ref Range Status   MRSA by PCR NEGATIVE NEGATIVE Final    Comment:        The GeneXpert MRSA Assay (FDA approved for NASAL specimens only), is one component of a comprehensive MRSA colonization surveillance program. It is not intended to diagnose MRSA infection nor to guide or monitor treatment for MRSA infections. Performed at [facility], [address] [place], [place] [address]      Labs: Basic Metabolic Panel:  Recent Labs  Lab 10/01/18 0040 10/02/18 0459  NA 140 136  K 4.8 4.4  CL 110 104  CO2 17* 25  GLUCOSE 144* 108*  BUN 79* 31*  CREATININE 12.16* 7.35*  CALCIUM 8.7* 8.6*  MG  --  2.2   Liver Function Tests: No results for input(s): AST, ALT, ALKPHOS, BILITOT, PROT, ALBUMIN in the last 168 hours. No results for input(s): LIPASE, AMYLASE in the last 168 hours. No results for input(s): AMMONIA in the last 168 hours. CBC: Recent Labs  Lab 10/01/18 0040  WBC 14.5*  NEUTROABS 11.4*  HGB 10.8*  HCT 34.1*  MCV 108.3*  PLT 238   Cardiac Enzymes: Recent Labs  Lab 10/01/18 0040 10/01/18 0715 10/01/18 1312  TROPONINI <0.03 <0.03 <0.03   BNP: Invalid input(s): POCBNP CBG: No results for input(s): GLUCAP in the last 168 hours.  Time coordinating discharge:  36 minutes  Signed:  Orson Eva, DO [facility] Pager: [phone] 10/02/2018, 4:26 PM

## 2018-10-02 NOTE — Progress Notes (Signed)
Patient alert and oriented x4. No complaints of pain, shortness of breath, chest pain, dizziness, nausea or vomiting. Patient up out of bed, ambulatory in room with supervision, gait steady. Patient tolerated meds and diet well, appetite good. IV removed without complications. Dressing to fistula and IV site were clean, dry and intact upon discharge. Discharge instructions and medication education gone over with patient. Patient expressed full understanding of instructions and education. Patient discharged home with all belongings via car.

## 2018-10-02 NOTE — Procedures (Signed)
    HEMODIALYSIS TREATMENT NOTE:  3.5 hour low-heparin dialysis completed via right forearm AVF (15g/antegrade). Goal met: 3.6 liters removed.  Ultrafiltration was interrupted x 15 minutes in response to SBP 116 and pt c/o feeling flushed.  All blood was returned and hemostasis was achieved in 15 minutes.  At EDW of 54.0kg.  Pt to discharge home after HD.  Rockwell Alexandria, RN

## 2018-10-04 DIAGNOSIS — D509 Iron deficiency anemia, unspecified: Secondary | ICD-10-CM | POA: Diagnosis not present

## 2018-10-04 DIAGNOSIS — N186 End stage renal disease: Secondary | ICD-10-CM | POA: Diagnosis not present

## 2018-10-04 DIAGNOSIS — N2581 Secondary hyperparathyroidism of renal origin: Secondary | ICD-10-CM | POA: Diagnosis not present

## 2018-10-04 DIAGNOSIS — D631 Anemia in chronic kidney disease: Secondary | ICD-10-CM | POA: Diagnosis not present

## 2018-10-04 DIAGNOSIS — Z992 Dependence on renal dialysis: Secondary | ICD-10-CM | POA: Diagnosis not present

## 2018-10-07 DIAGNOSIS — D631 Anemia in chronic kidney disease: Secondary | ICD-10-CM | POA: Diagnosis not present

## 2018-10-07 DIAGNOSIS — N186 End stage renal disease: Secondary | ICD-10-CM | POA: Diagnosis not present

## 2018-10-07 DIAGNOSIS — D509 Iron deficiency anemia, unspecified: Secondary | ICD-10-CM | POA: Diagnosis not present

## 2018-10-07 DIAGNOSIS — Z992 Dependence on renal dialysis: Secondary | ICD-10-CM | POA: Diagnosis not present

## 2018-10-07 DIAGNOSIS — N2581 Secondary hyperparathyroidism of renal origin: Secondary | ICD-10-CM | POA: Diagnosis not present

## 2018-10-08 DIAGNOSIS — Z992 Dependence on renal dialysis: Secondary | ICD-10-CM | POA: Diagnosis not present

## 2018-10-08 DIAGNOSIS — J449 Chronic obstructive pulmonary disease, unspecified: Secondary | ICD-10-CM | POA: Diagnosis not present

## 2018-10-08 DIAGNOSIS — N186 End stage renal disease: Secondary | ICD-10-CM | POA: Diagnosis not present

## 2018-10-08 DIAGNOSIS — J810 Acute pulmonary edema: Secondary | ICD-10-CM | POA: Diagnosis not present

## 2018-10-08 DIAGNOSIS — I10 Essential (primary) hypertension: Secondary | ICD-10-CM | POA: Diagnosis not present

## 2018-10-09 DIAGNOSIS — D509 Iron deficiency anemia, unspecified: Secondary | ICD-10-CM | POA: Diagnosis not present

## 2018-10-09 DIAGNOSIS — D631 Anemia in chronic kidney disease: Secondary | ICD-10-CM | POA: Diagnosis not present

## 2018-10-09 DIAGNOSIS — N2581 Secondary hyperparathyroidism of renal origin: Secondary | ICD-10-CM | POA: Diagnosis not present

## 2018-10-09 DIAGNOSIS — N186 End stage renal disease: Secondary | ICD-10-CM | POA: Diagnosis not present

## 2018-10-09 DIAGNOSIS — Z992 Dependence on renal dialysis: Secondary | ICD-10-CM | POA: Diagnosis not present

## 2018-10-11 DIAGNOSIS — D631 Anemia in chronic kidney disease: Secondary | ICD-10-CM | POA: Diagnosis not present

## 2018-10-11 DIAGNOSIS — Z992 Dependence on renal dialysis: Secondary | ICD-10-CM | POA: Diagnosis not present

## 2018-10-11 DIAGNOSIS — N2581 Secondary hyperparathyroidism of renal origin: Secondary | ICD-10-CM | POA: Diagnosis not present

## 2018-10-11 DIAGNOSIS — N186 End stage renal disease: Secondary | ICD-10-CM | POA: Diagnosis not present

## 2018-10-11 DIAGNOSIS — D509 Iron deficiency anemia, unspecified: Secondary | ICD-10-CM | POA: Diagnosis not present

## 2018-10-14 DIAGNOSIS — N186 End stage renal disease: Secondary | ICD-10-CM | POA: Diagnosis not present

## 2018-10-14 DIAGNOSIS — D509 Iron deficiency anemia, unspecified: Secondary | ICD-10-CM | POA: Diagnosis not present

## 2018-10-14 DIAGNOSIS — Z992 Dependence on renal dialysis: Secondary | ICD-10-CM | POA: Diagnosis not present

## 2018-10-14 DIAGNOSIS — N2581 Secondary hyperparathyroidism of renal origin: Secondary | ICD-10-CM | POA: Diagnosis not present

## 2018-10-14 DIAGNOSIS — D631 Anemia in chronic kidney disease: Secondary | ICD-10-CM | POA: Diagnosis not present

## 2018-10-16 DIAGNOSIS — Z992 Dependence on renal dialysis: Secondary | ICD-10-CM | POA: Diagnosis not present

## 2018-10-16 DIAGNOSIS — N186 End stage renal disease: Secondary | ICD-10-CM | POA: Diagnosis not present

## 2018-10-16 DIAGNOSIS — D509 Iron deficiency anemia, unspecified: Secondary | ICD-10-CM | POA: Diagnosis not present

## 2018-10-16 DIAGNOSIS — N2581 Secondary hyperparathyroidism of renal origin: Secondary | ICD-10-CM | POA: Diagnosis not present

## 2018-10-16 DIAGNOSIS — D631 Anemia in chronic kidney disease: Secondary | ICD-10-CM | POA: Diagnosis not present

## 2018-10-18 DIAGNOSIS — D631 Anemia in chronic kidney disease: Secondary | ICD-10-CM | POA: Diagnosis not present

## 2018-10-18 DIAGNOSIS — D509 Iron deficiency anemia, unspecified: Secondary | ICD-10-CM | POA: Diagnosis not present

## 2018-10-18 DIAGNOSIS — Z992 Dependence on renal dialysis: Secondary | ICD-10-CM | POA: Diagnosis not present

## 2018-10-18 DIAGNOSIS — N2581 Secondary hyperparathyroidism of renal origin: Secondary | ICD-10-CM | POA: Diagnosis not present

## 2018-10-18 DIAGNOSIS — N186 End stage renal disease: Secondary | ICD-10-CM | POA: Diagnosis not present

## 2018-10-21 DIAGNOSIS — D509 Iron deficiency anemia, unspecified: Secondary | ICD-10-CM | POA: Diagnosis not present

## 2018-10-21 DIAGNOSIS — D631 Anemia in chronic kidney disease: Secondary | ICD-10-CM | POA: Diagnosis not present

## 2018-10-21 DIAGNOSIS — Z992 Dependence on renal dialysis: Secondary | ICD-10-CM | POA: Diagnosis not present

## 2018-10-21 DIAGNOSIS — N186 End stage renal disease: Secondary | ICD-10-CM | POA: Diagnosis not present

## 2018-10-21 DIAGNOSIS — N2581 Secondary hyperparathyroidism of renal origin: Secondary | ICD-10-CM | POA: Diagnosis not present

## 2018-10-23 DIAGNOSIS — N186 End stage renal disease: Secondary | ICD-10-CM | POA: Diagnosis not present

## 2018-10-23 DIAGNOSIS — D509 Iron deficiency anemia, unspecified: Secondary | ICD-10-CM | POA: Diagnosis not present

## 2018-10-23 DIAGNOSIS — N2581 Secondary hyperparathyroidism of renal origin: Secondary | ICD-10-CM | POA: Diagnosis not present

## 2018-10-23 DIAGNOSIS — D631 Anemia in chronic kidney disease: Secondary | ICD-10-CM | POA: Diagnosis not present

## 2018-10-23 DIAGNOSIS — Z992 Dependence on renal dialysis: Secondary | ICD-10-CM | POA: Diagnosis not present

## 2018-10-25 DIAGNOSIS — D631 Anemia in chronic kidney disease: Secondary | ICD-10-CM | POA: Diagnosis not present

## 2018-10-25 DIAGNOSIS — N2581 Secondary hyperparathyroidism of renal origin: Secondary | ICD-10-CM | POA: Diagnosis not present

## 2018-10-25 DIAGNOSIS — D509 Iron deficiency anemia, unspecified: Secondary | ICD-10-CM | POA: Diagnosis not present

## 2018-10-25 DIAGNOSIS — Z992 Dependence on renal dialysis: Secondary | ICD-10-CM | POA: Diagnosis not present

## 2018-10-25 DIAGNOSIS — N186 End stage renal disease: Secondary | ICD-10-CM | POA: Diagnosis not present

## 2018-10-28 DIAGNOSIS — N186 End stage renal disease: Secondary | ICD-10-CM | POA: Diagnosis not present

## 2018-10-28 DIAGNOSIS — N2581 Secondary hyperparathyroidism of renal origin: Secondary | ICD-10-CM | POA: Diagnosis not present

## 2018-10-28 DIAGNOSIS — Z992 Dependence on renal dialysis: Secondary | ICD-10-CM | POA: Diagnosis not present

## 2018-10-28 DIAGNOSIS — D509 Iron deficiency anemia, unspecified: Secondary | ICD-10-CM | POA: Diagnosis not present

## 2018-10-28 DIAGNOSIS — D631 Anemia in chronic kidney disease: Secondary | ICD-10-CM | POA: Diagnosis not present

## 2018-11-01 DIAGNOSIS — D631 Anemia in chronic kidney disease: Secondary | ICD-10-CM | POA: Diagnosis not present

## 2018-11-01 DIAGNOSIS — N2581 Secondary hyperparathyroidism of renal origin: Secondary | ICD-10-CM | POA: Diagnosis not present

## 2018-11-01 DIAGNOSIS — D509 Iron deficiency anemia, unspecified: Secondary | ICD-10-CM | POA: Diagnosis not present

## 2018-11-01 DIAGNOSIS — Z992 Dependence on renal dialysis: Secondary | ICD-10-CM | POA: Diagnosis not present

## 2018-11-01 DIAGNOSIS — N186 End stage renal disease: Secondary | ICD-10-CM | POA: Diagnosis not present

## 2018-11-04 DIAGNOSIS — N2581 Secondary hyperparathyroidism of renal origin: Secondary | ICD-10-CM | POA: Diagnosis not present

## 2018-11-04 DIAGNOSIS — Z992 Dependence on renal dialysis: Secondary | ICD-10-CM | POA: Diagnosis not present

## 2018-11-04 DIAGNOSIS — D509 Iron deficiency anemia, unspecified: Secondary | ICD-10-CM | POA: Diagnosis not present

## 2018-11-04 DIAGNOSIS — D631 Anemia in chronic kidney disease: Secondary | ICD-10-CM | POA: Diagnosis not present

## 2018-11-04 DIAGNOSIS — N186 End stage renal disease: Secondary | ICD-10-CM | POA: Diagnosis not present

## 2018-11-07 DIAGNOSIS — Z992 Dependence on renal dialysis: Secondary | ICD-10-CM | POA: Diagnosis not present

## 2018-11-07 DIAGNOSIS — N186 End stage renal disease: Secondary | ICD-10-CM | POA: Diagnosis not present

## 2018-11-08 DIAGNOSIS — Z992 Dependence on renal dialysis: Secondary | ICD-10-CM | POA: Diagnosis not present

## 2018-11-08 DIAGNOSIS — N186 End stage renal disease: Secondary | ICD-10-CM | POA: Diagnosis not present

## 2018-11-08 DIAGNOSIS — D631 Anemia in chronic kidney disease: Secondary | ICD-10-CM | POA: Diagnosis not present

## 2018-11-08 DIAGNOSIS — N2581 Secondary hyperparathyroidism of renal origin: Secondary | ICD-10-CM | POA: Diagnosis not present

## 2018-11-08 DIAGNOSIS — D509 Iron deficiency anemia, unspecified: Secondary | ICD-10-CM | POA: Diagnosis not present

## 2018-11-11 DIAGNOSIS — D509 Iron deficiency anemia, unspecified: Secondary | ICD-10-CM | POA: Diagnosis not present

## 2018-11-11 DIAGNOSIS — D631 Anemia in chronic kidney disease: Secondary | ICD-10-CM | POA: Diagnosis not present

## 2018-11-11 DIAGNOSIS — N2581 Secondary hyperparathyroidism of renal origin: Secondary | ICD-10-CM | POA: Diagnosis not present

## 2018-11-11 DIAGNOSIS — N186 End stage renal disease: Secondary | ICD-10-CM | POA: Diagnosis not present

## 2018-11-11 DIAGNOSIS — Z992 Dependence on renal dialysis: Secondary | ICD-10-CM | POA: Diagnosis not present

## 2018-11-15 DIAGNOSIS — Z992 Dependence on renal dialysis: Secondary | ICD-10-CM | POA: Diagnosis not present

## 2018-11-15 DIAGNOSIS — D509 Iron deficiency anemia, unspecified: Secondary | ICD-10-CM | POA: Diagnosis not present

## 2018-11-15 DIAGNOSIS — D631 Anemia in chronic kidney disease: Secondary | ICD-10-CM | POA: Diagnosis not present

## 2018-11-15 DIAGNOSIS — N186 End stage renal disease: Secondary | ICD-10-CM | POA: Diagnosis not present

## 2018-11-15 DIAGNOSIS — N2581 Secondary hyperparathyroidism of renal origin: Secondary | ICD-10-CM | POA: Diagnosis not present

## 2018-11-20 DIAGNOSIS — N186 End stage renal disease: Secondary | ICD-10-CM | POA: Diagnosis not present

## 2018-11-20 DIAGNOSIS — D509 Iron deficiency anemia, unspecified: Secondary | ICD-10-CM | POA: Diagnosis not present

## 2018-11-20 DIAGNOSIS — Z992 Dependence on renal dialysis: Secondary | ICD-10-CM | POA: Diagnosis not present

## 2018-11-20 DIAGNOSIS — D631 Anemia in chronic kidney disease: Secondary | ICD-10-CM | POA: Diagnosis not present

## 2018-11-20 DIAGNOSIS — N2581 Secondary hyperparathyroidism of renal origin: Secondary | ICD-10-CM | POA: Diagnosis not present

## 2018-11-22 DIAGNOSIS — D509 Iron deficiency anemia, unspecified: Secondary | ICD-10-CM | POA: Diagnosis not present

## 2018-11-22 DIAGNOSIS — Z992 Dependence on renal dialysis: Secondary | ICD-10-CM | POA: Diagnosis not present

## 2018-11-22 DIAGNOSIS — D631 Anemia in chronic kidney disease: Secondary | ICD-10-CM | POA: Diagnosis not present

## 2018-11-22 DIAGNOSIS — N186 End stage renal disease: Secondary | ICD-10-CM | POA: Diagnosis not present

## 2018-11-22 DIAGNOSIS — N2581 Secondary hyperparathyroidism of renal origin: Secondary | ICD-10-CM | POA: Diagnosis not present

## 2018-11-25 DIAGNOSIS — N186 End stage renal disease: Secondary | ICD-10-CM | POA: Diagnosis not present

## 2018-11-25 DIAGNOSIS — Z992 Dependence on renal dialysis: Secondary | ICD-10-CM | POA: Diagnosis not present

## 2018-11-25 DIAGNOSIS — D509 Iron deficiency anemia, unspecified: Secondary | ICD-10-CM | POA: Diagnosis not present

## 2018-11-25 DIAGNOSIS — N2581 Secondary hyperparathyroidism of renal origin: Secondary | ICD-10-CM | POA: Diagnosis not present

## 2018-11-25 DIAGNOSIS — D631 Anemia in chronic kidney disease: Secondary | ICD-10-CM | POA: Diagnosis not present

## 2018-11-27 DIAGNOSIS — N2581 Secondary hyperparathyroidism of renal origin: Secondary | ICD-10-CM | POA: Diagnosis not present

## 2018-11-27 DIAGNOSIS — D631 Anemia in chronic kidney disease: Secondary | ICD-10-CM | POA: Diagnosis not present

## 2018-11-27 DIAGNOSIS — N186 End stage renal disease: Secondary | ICD-10-CM | POA: Diagnosis not present

## 2018-11-27 DIAGNOSIS — D509 Iron deficiency anemia, unspecified: Secondary | ICD-10-CM | POA: Diagnosis not present

## 2018-11-27 DIAGNOSIS — Z992 Dependence on renal dialysis: Secondary | ICD-10-CM | POA: Diagnosis not present

## 2018-11-29 DIAGNOSIS — Z992 Dependence on renal dialysis: Secondary | ICD-10-CM | POA: Diagnosis not present

## 2018-11-29 DIAGNOSIS — D631 Anemia in chronic kidney disease: Secondary | ICD-10-CM | POA: Diagnosis not present

## 2018-11-29 DIAGNOSIS — D509 Iron deficiency anemia, unspecified: Secondary | ICD-10-CM | POA: Diagnosis not present

## 2018-11-29 DIAGNOSIS — N186 End stage renal disease: Secondary | ICD-10-CM | POA: Diagnosis not present

## 2018-11-29 DIAGNOSIS — N2581 Secondary hyperparathyroidism of renal origin: Secondary | ICD-10-CM | POA: Diagnosis not present

## 2018-12-02 DIAGNOSIS — D631 Anemia in chronic kidney disease: Secondary | ICD-10-CM | POA: Diagnosis not present

## 2018-12-02 DIAGNOSIS — N2581 Secondary hyperparathyroidism of renal origin: Secondary | ICD-10-CM | POA: Diagnosis not present

## 2018-12-02 DIAGNOSIS — N186 End stage renal disease: Secondary | ICD-10-CM | POA: Diagnosis not present

## 2018-12-02 DIAGNOSIS — D509 Iron deficiency anemia, unspecified: Secondary | ICD-10-CM | POA: Diagnosis not present

## 2018-12-02 DIAGNOSIS — Z992 Dependence on renal dialysis: Secondary | ICD-10-CM | POA: Diagnosis not present

## 2018-12-04 DIAGNOSIS — D631 Anemia in chronic kidney disease: Secondary | ICD-10-CM | POA: Diagnosis not present

## 2018-12-04 DIAGNOSIS — N2581 Secondary hyperparathyroidism of renal origin: Secondary | ICD-10-CM | POA: Diagnosis not present

## 2018-12-04 DIAGNOSIS — D509 Iron deficiency anemia, unspecified: Secondary | ICD-10-CM | POA: Diagnosis not present

## 2018-12-04 DIAGNOSIS — Z992 Dependence on renal dialysis: Secondary | ICD-10-CM | POA: Diagnosis not present

## 2018-12-04 DIAGNOSIS — N186 End stage renal disease: Secondary | ICD-10-CM | POA: Diagnosis not present

## 2018-12-08 DIAGNOSIS — Z992 Dependence on renal dialysis: Secondary | ICD-10-CM | POA: Diagnosis not present

## 2018-12-08 DIAGNOSIS — N186 End stage renal disease: Secondary | ICD-10-CM | POA: Diagnosis not present

## 2018-12-09 DIAGNOSIS — D631 Anemia in chronic kidney disease: Secondary | ICD-10-CM | POA: Diagnosis not present

## 2018-12-09 DIAGNOSIS — Z992 Dependence on renal dialysis: Secondary | ICD-10-CM | POA: Diagnosis not present

## 2018-12-09 DIAGNOSIS — N2581 Secondary hyperparathyroidism of renal origin: Secondary | ICD-10-CM | POA: Diagnosis not present

## 2018-12-09 DIAGNOSIS — D509 Iron deficiency anemia, unspecified: Secondary | ICD-10-CM | POA: Diagnosis not present

## 2018-12-09 DIAGNOSIS — N186 End stage renal disease: Secondary | ICD-10-CM | POA: Diagnosis not present

## 2018-12-11 DIAGNOSIS — N2581 Secondary hyperparathyroidism of renal origin: Secondary | ICD-10-CM | POA: Diagnosis not present

## 2018-12-11 DIAGNOSIS — D509 Iron deficiency anemia, unspecified: Secondary | ICD-10-CM | POA: Diagnosis not present

## 2018-12-11 DIAGNOSIS — Z992 Dependence on renal dialysis: Secondary | ICD-10-CM | POA: Diagnosis not present

## 2018-12-11 DIAGNOSIS — N186 End stage renal disease: Secondary | ICD-10-CM | POA: Diagnosis not present

## 2018-12-11 DIAGNOSIS — D631 Anemia in chronic kidney disease: Secondary | ICD-10-CM | POA: Diagnosis not present

## 2018-12-13 DIAGNOSIS — D631 Anemia in chronic kidney disease: Secondary | ICD-10-CM | POA: Diagnosis not present

## 2018-12-13 DIAGNOSIS — Z992 Dependence on renal dialysis: Secondary | ICD-10-CM | POA: Diagnosis not present

## 2018-12-13 DIAGNOSIS — D509 Iron deficiency anemia, unspecified: Secondary | ICD-10-CM | POA: Diagnosis not present

## 2018-12-13 DIAGNOSIS — N186 End stage renal disease: Secondary | ICD-10-CM | POA: Diagnosis not present

## 2018-12-13 DIAGNOSIS — N2581 Secondary hyperparathyroidism of renal origin: Secondary | ICD-10-CM | POA: Diagnosis not present

## 2018-12-16 DIAGNOSIS — D509 Iron deficiency anemia, unspecified: Secondary | ICD-10-CM | POA: Diagnosis not present

## 2018-12-16 DIAGNOSIS — D631 Anemia in chronic kidney disease: Secondary | ICD-10-CM | POA: Diagnosis not present

## 2018-12-16 DIAGNOSIS — Z992 Dependence on renal dialysis: Secondary | ICD-10-CM | POA: Diagnosis not present

## 2018-12-16 DIAGNOSIS — N186 End stage renal disease: Secondary | ICD-10-CM | POA: Diagnosis not present

## 2018-12-16 DIAGNOSIS — N2581 Secondary hyperparathyroidism of renal origin: Secondary | ICD-10-CM | POA: Diagnosis not present

## 2018-12-17 DIAGNOSIS — N186 End stage renal disease: Secondary | ICD-10-CM | POA: Diagnosis not present

## 2018-12-17 DIAGNOSIS — J449 Chronic obstructive pulmonary disease, unspecified: Secondary | ICD-10-CM | POA: Diagnosis not present

## 2018-12-17 DIAGNOSIS — I10 Essential (primary) hypertension: Secondary | ICD-10-CM | POA: Diagnosis not present

## 2018-12-17 DIAGNOSIS — F17210 Nicotine dependence, cigarettes, uncomplicated: Secondary | ICD-10-CM | POA: Diagnosis not present

## 2018-12-17 DIAGNOSIS — E43 Unspecified severe protein-calorie malnutrition: Secondary | ICD-10-CM | POA: Diagnosis not present

## 2018-12-20 DIAGNOSIS — D631 Anemia in chronic kidney disease: Secondary | ICD-10-CM | POA: Diagnosis not present

## 2018-12-20 DIAGNOSIS — Z992 Dependence on renal dialysis: Secondary | ICD-10-CM | POA: Diagnosis not present

## 2018-12-20 DIAGNOSIS — N186 End stage renal disease: Secondary | ICD-10-CM | POA: Diagnosis not present

## 2018-12-20 DIAGNOSIS — N2581 Secondary hyperparathyroidism of renal origin: Secondary | ICD-10-CM | POA: Diagnosis not present

## 2018-12-20 DIAGNOSIS — D509 Iron deficiency anemia, unspecified: Secondary | ICD-10-CM | POA: Diagnosis not present

## 2018-12-25 DIAGNOSIS — Z992 Dependence on renal dialysis: Secondary | ICD-10-CM | POA: Diagnosis not present

## 2018-12-25 DIAGNOSIS — N2581 Secondary hyperparathyroidism of renal origin: Secondary | ICD-10-CM | POA: Diagnosis not present

## 2018-12-25 DIAGNOSIS — D509 Iron deficiency anemia, unspecified: Secondary | ICD-10-CM | POA: Diagnosis not present

## 2018-12-25 DIAGNOSIS — N186 End stage renal disease: Secondary | ICD-10-CM | POA: Diagnosis not present

## 2018-12-25 DIAGNOSIS — D631 Anemia in chronic kidney disease: Secondary | ICD-10-CM | POA: Diagnosis not present

## 2018-12-27 DIAGNOSIS — N2581 Secondary hyperparathyroidism of renal origin: Secondary | ICD-10-CM | POA: Diagnosis not present

## 2018-12-27 DIAGNOSIS — D631 Anemia in chronic kidney disease: Secondary | ICD-10-CM | POA: Diagnosis not present

## 2018-12-27 DIAGNOSIS — N186 End stage renal disease: Secondary | ICD-10-CM | POA: Diagnosis not present

## 2018-12-27 DIAGNOSIS — D509 Iron deficiency anemia, unspecified: Secondary | ICD-10-CM | POA: Diagnosis not present

## 2018-12-27 DIAGNOSIS — Z992 Dependence on renal dialysis: Secondary | ICD-10-CM | POA: Diagnosis not present

## 2018-12-30 DIAGNOSIS — N186 End stage renal disease: Secondary | ICD-10-CM | POA: Diagnosis not present

## 2018-12-30 DIAGNOSIS — D631 Anemia in chronic kidney disease: Secondary | ICD-10-CM | POA: Diagnosis not present

## 2018-12-30 DIAGNOSIS — N2581 Secondary hyperparathyroidism of renal origin: Secondary | ICD-10-CM | POA: Diagnosis not present

## 2018-12-30 DIAGNOSIS — Z992 Dependence on renal dialysis: Secondary | ICD-10-CM | POA: Diagnosis not present

## 2018-12-30 DIAGNOSIS — D509 Iron deficiency anemia, unspecified: Secondary | ICD-10-CM | POA: Diagnosis not present

## 2019-01-01 DIAGNOSIS — N186 End stage renal disease: Secondary | ICD-10-CM | POA: Diagnosis not present

## 2019-01-01 DIAGNOSIS — D509 Iron deficiency anemia, unspecified: Secondary | ICD-10-CM | POA: Diagnosis not present

## 2019-01-01 DIAGNOSIS — D631 Anemia in chronic kidney disease: Secondary | ICD-10-CM | POA: Diagnosis not present

## 2019-01-01 DIAGNOSIS — N2581 Secondary hyperparathyroidism of renal origin: Secondary | ICD-10-CM | POA: Diagnosis not present

## 2019-01-01 DIAGNOSIS — Z992 Dependence on renal dialysis: Secondary | ICD-10-CM | POA: Diagnosis not present

## 2019-01-06 DIAGNOSIS — D509 Iron deficiency anemia, unspecified: Secondary | ICD-10-CM | POA: Diagnosis not present

## 2019-01-06 DIAGNOSIS — Z992 Dependence on renal dialysis: Secondary | ICD-10-CM | POA: Diagnosis not present

## 2019-01-06 DIAGNOSIS — N2581 Secondary hyperparathyroidism of renal origin: Secondary | ICD-10-CM | POA: Diagnosis not present

## 2019-01-06 DIAGNOSIS — D631 Anemia in chronic kidney disease: Secondary | ICD-10-CM | POA: Diagnosis not present

## 2019-01-06 DIAGNOSIS — N186 End stage renal disease: Secondary | ICD-10-CM | POA: Diagnosis not present

## 2019-01-07 DIAGNOSIS — Z992 Dependence on renal dialysis: Secondary | ICD-10-CM | POA: Diagnosis not present

## 2019-01-07 DIAGNOSIS — N186 End stage renal disease: Secondary | ICD-10-CM | POA: Diagnosis not present

## 2019-01-08 DIAGNOSIS — N2581 Secondary hyperparathyroidism of renal origin: Secondary | ICD-10-CM | POA: Diagnosis not present

## 2019-01-08 DIAGNOSIS — Z992 Dependence on renal dialysis: Secondary | ICD-10-CM | POA: Diagnosis not present

## 2019-01-08 DIAGNOSIS — D509 Iron deficiency anemia, unspecified: Secondary | ICD-10-CM | POA: Diagnosis not present

## 2019-01-08 DIAGNOSIS — N186 End stage renal disease: Secondary | ICD-10-CM | POA: Diagnosis not present

## 2019-01-08 DIAGNOSIS — D631 Anemia in chronic kidney disease: Secondary | ICD-10-CM | POA: Diagnosis not present

## 2019-01-10 DIAGNOSIS — N2581 Secondary hyperparathyroidism of renal origin: Secondary | ICD-10-CM | POA: Diagnosis not present

## 2019-01-10 DIAGNOSIS — D631 Anemia in chronic kidney disease: Secondary | ICD-10-CM | POA: Diagnosis not present

## 2019-01-10 DIAGNOSIS — Z992 Dependence on renal dialysis: Secondary | ICD-10-CM | POA: Diagnosis not present

## 2019-01-10 DIAGNOSIS — D509 Iron deficiency anemia, unspecified: Secondary | ICD-10-CM | POA: Diagnosis not present

## 2019-01-10 DIAGNOSIS — N186 End stage renal disease: Secondary | ICD-10-CM | POA: Diagnosis not present

## 2019-01-13 DIAGNOSIS — D631 Anemia in chronic kidney disease: Secondary | ICD-10-CM | POA: Diagnosis not present

## 2019-01-13 DIAGNOSIS — Z992 Dependence on renal dialysis: Secondary | ICD-10-CM | POA: Diagnosis not present

## 2019-01-13 DIAGNOSIS — D509 Iron deficiency anemia, unspecified: Secondary | ICD-10-CM | POA: Diagnosis not present

## 2019-01-13 DIAGNOSIS — N2581 Secondary hyperparathyroidism of renal origin: Secondary | ICD-10-CM | POA: Diagnosis not present

## 2019-01-13 DIAGNOSIS — N186 End stage renal disease: Secondary | ICD-10-CM | POA: Diagnosis not present

## 2019-01-15 DIAGNOSIS — D631 Anemia in chronic kidney disease: Secondary | ICD-10-CM | POA: Diagnosis not present

## 2019-01-15 DIAGNOSIS — D509 Iron deficiency anemia, unspecified: Secondary | ICD-10-CM | POA: Diagnosis not present

## 2019-01-15 DIAGNOSIS — N186 End stage renal disease: Secondary | ICD-10-CM | POA: Diagnosis not present

## 2019-01-15 DIAGNOSIS — Z992 Dependence on renal dialysis: Secondary | ICD-10-CM | POA: Diagnosis not present

## 2019-01-15 DIAGNOSIS — N2581 Secondary hyperparathyroidism of renal origin: Secondary | ICD-10-CM | POA: Diagnosis not present

## 2019-01-17 DIAGNOSIS — D631 Anemia in chronic kidney disease: Secondary | ICD-10-CM | POA: Diagnosis not present

## 2019-01-17 DIAGNOSIS — Z992 Dependence on renal dialysis: Secondary | ICD-10-CM | POA: Diagnosis not present

## 2019-01-17 DIAGNOSIS — N2581 Secondary hyperparathyroidism of renal origin: Secondary | ICD-10-CM | POA: Diagnosis not present

## 2019-01-17 DIAGNOSIS — N186 End stage renal disease: Secondary | ICD-10-CM | POA: Diagnosis not present

## 2019-01-17 DIAGNOSIS — D509 Iron deficiency anemia, unspecified: Secondary | ICD-10-CM | POA: Diagnosis not present

## 2019-01-20 DIAGNOSIS — N186 End stage renal disease: Secondary | ICD-10-CM | POA: Diagnosis not present

## 2019-01-20 DIAGNOSIS — N2581 Secondary hyperparathyroidism of renal origin: Secondary | ICD-10-CM | POA: Diagnosis not present

## 2019-01-20 DIAGNOSIS — Z992 Dependence on renal dialysis: Secondary | ICD-10-CM | POA: Diagnosis not present

## 2019-01-20 DIAGNOSIS — D509 Iron deficiency anemia, unspecified: Secondary | ICD-10-CM | POA: Diagnosis not present

## 2019-01-20 DIAGNOSIS — D631 Anemia in chronic kidney disease: Secondary | ICD-10-CM | POA: Diagnosis not present

## 2019-01-22 DIAGNOSIS — N186 End stage renal disease: Secondary | ICD-10-CM | POA: Diagnosis not present

## 2019-01-22 DIAGNOSIS — N2581 Secondary hyperparathyroidism of renal origin: Secondary | ICD-10-CM | POA: Diagnosis not present

## 2019-01-22 DIAGNOSIS — Z992 Dependence on renal dialysis: Secondary | ICD-10-CM | POA: Diagnosis not present

## 2019-01-22 DIAGNOSIS — D509 Iron deficiency anemia, unspecified: Secondary | ICD-10-CM | POA: Diagnosis not present

## 2019-01-22 DIAGNOSIS — D631 Anemia in chronic kidney disease: Secondary | ICD-10-CM | POA: Diagnosis not present

## 2019-01-24 DIAGNOSIS — N2581 Secondary hyperparathyroidism of renal origin: Secondary | ICD-10-CM | POA: Diagnosis not present

## 2019-01-24 DIAGNOSIS — D631 Anemia in chronic kidney disease: Secondary | ICD-10-CM | POA: Diagnosis not present

## 2019-01-24 DIAGNOSIS — Z992 Dependence on renal dialysis: Secondary | ICD-10-CM | POA: Diagnosis not present

## 2019-01-24 DIAGNOSIS — N186 End stage renal disease: Secondary | ICD-10-CM | POA: Diagnosis not present

## 2019-01-24 DIAGNOSIS — D509 Iron deficiency anemia, unspecified: Secondary | ICD-10-CM | POA: Diagnosis not present

## 2019-01-29 DIAGNOSIS — D509 Iron deficiency anemia, unspecified: Secondary | ICD-10-CM | POA: Diagnosis not present

## 2019-01-29 DIAGNOSIS — N2581 Secondary hyperparathyroidism of renal origin: Secondary | ICD-10-CM | POA: Diagnosis not present

## 2019-01-29 DIAGNOSIS — D631 Anemia in chronic kidney disease: Secondary | ICD-10-CM | POA: Diagnosis not present

## 2019-01-29 DIAGNOSIS — Z992 Dependence on renal dialysis: Secondary | ICD-10-CM | POA: Diagnosis not present

## 2019-01-29 DIAGNOSIS — N186 End stage renal disease: Secondary | ICD-10-CM | POA: Diagnosis not present

## 2019-01-31 DIAGNOSIS — N2581 Secondary hyperparathyroidism of renal origin: Secondary | ICD-10-CM | POA: Diagnosis not present

## 2019-01-31 DIAGNOSIS — Z992 Dependence on renal dialysis: Secondary | ICD-10-CM | POA: Diagnosis not present

## 2019-01-31 DIAGNOSIS — D509 Iron deficiency anemia, unspecified: Secondary | ICD-10-CM | POA: Diagnosis not present

## 2019-01-31 DIAGNOSIS — D631 Anemia in chronic kidney disease: Secondary | ICD-10-CM | POA: Diagnosis not present

## 2019-01-31 DIAGNOSIS — N186 End stage renal disease: Secondary | ICD-10-CM | POA: Diagnosis not present

## 2019-02-05 DIAGNOSIS — N2581 Secondary hyperparathyroidism of renal origin: Secondary | ICD-10-CM | POA: Diagnosis not present

## 2019-02-05 DIAGNOSIS — Z992 Dependence on renal dialysis: Secondary | ICD-10-CM | POA: Diagnosis not present

## 2019-02-05 DIAGNOSIS — N186 End stage renal disease: Secondary | ICD-10-CM | POA: Diagnosis not present

## 2019-02-05 DIAGNOSIS — D631 Anemia in chronic kidney disease: Secondary | ICD-10-CM | POA: Diagnosis not present

## 2019-02-05 DIAGNOSIS — D509 Iron deficiency anemia, unspecified: Secondary | ICD-10-CM | POA: Diagnosis not present

## 2019-02-07 DIAGNOSIS — N186 End stage renal disease: Secondary | ICD-10-CM | POA: Diagnosis not present

## 2019-02-07 DIAGNOSIS — N2581 Secondary hyperparathyroidism of renal origin: Secondary | ICD-10-CM | POA: Diagnosis not present

## 2019-02-07 DIAGNOSIS — D631 Anemia in chronic kidney disease: Secondary | ICD-10-CM | POA: Diagnosis not present

## 2019-02-07 DIAGNOSIS — Z992 Dependence on renal dialysis: Secondary | ICD-10-CM | POA: Diagnosis not present

## 2019-02-07 DIAGNOSIS — D509 Iron deficiency anemia, unspecified: Secondary | ICD-10-CM | POA: Diagnosis not present

## 2019-02-10 DIAGNOSIS — N2581 Secondary hyperparathyroidism of renal origin: Secondary | ICD-10-CM | POA: Diagnosis not present

## 2019-02-10 DIAGNOSIS — N186 End stage renal disease: Secondary | ICD-10-CM | POA: Diagnosis not present

## 2019-02-10 DIAGNOSIS — D509 Iron deficiency anemia, unspecified: Secondary | ICD-10-CM | POA: Diagnosis not present

## 2019-02-10 DIAGNOSIS — D631 Anemia in chronic kidney disease: Secondary | ICD-10-CM | POA: Diagnosis not present

## 2019-02-10 DIAGNOSIS — Z992 Dependence on renal dialysis: Secondary | ICD-10-CM | POA: Diagnosis not present

## 2019-02-12 DIAGNOSIS — N186 End stage renal disease: Secondary | ICD-10-CM | POA: Diagnosis not present

## 2019-02-12 DIAGNOSIS — N2581 Secondary hyperparathyroidism of renal origin: Secondary | ICD-10-CM | POA: Diagnosis not present

## 2019-02-12 DIAGNOSIS — D631 Anemia in chronic kidney disease: Secondary | ICD-10-CM | POA: Diagnosis not present

## 2019-02-12 DIAGNOSIS — Z992 Dependence on renal dialysis: Secondary | ICD-10-CM | POA: Diagnosis not present

## 2019-02-12 DIAGNOSIS — D509 Iron deficiency anemia, unspecified: Secondary | ICD-10-CM | POA: Diagnosis not present

## 2019-02-14 DIAGNOSIS — D631 Anemia in chronic kidney disease: Secondary | ICD-10-CM | POA: Diagnosis not present

## 2019-02-14 DIAGNOSIS — N186 End stage renal disease: Secondary | ICD-10-CM | POA: Diagnosis not present

## 2019-02-14 DIAGNOSIS — N2581 Secondary hyperparathyroidism of renal origin: Secondary | ICD-10-CM | POA: Diagnosis not present

## 2019-02-14 DIAGNOSIS — D509 Iron deficiency anemia, unspecified: Secondary | ICD-10-CM | POA: Diagnosis not present

## 2019-02-14 DIAGNOSIS — Z992 Dependence on renal dialysis: Secondary | ICD-10-CM | POA: Diagnosis not present

## 2019-02-17 DIAGNOSIS — N2581 Secondary hyperparathyroidism of renal origin: Secondary | ICD-10-CM | POA: Diagnosis not present

## 2019-02-17 DIAGNOSIS — D631 Anemia in chronic kidney disease: Secondary | ICD-10-CM | POA: Diagnosis not present

## 2019-02-17 DIAGNOSIS — N186 End stage renal disease: Secondary | ICD-10-CM | POA: Diagnosis not present

## 2019-02-17 DIAGNOSIS — Z992 Dependence on renal dialysis: Secondary | ICD-10-CM | POA: Diagnosis not present

## 2019-02-17 DIAGNOSIS — D509 Iron deficiency anemia, unspecified: Secondary | ICD-10-CM | POA: Diagnosis not present

## 2019-02-18 ENCOUNTER — Ambulatory Visit (HOSPITAL_COMMUNITY)
Admission: RE | Admit: 2019-02-18 | Discharge: 2019-02-18 | Disposition: A | Discharge status: Home / Self Care | Payer: Medicare Other | Source: Ambulatory Visit | Attending: Pulmonary Disease | Admitting: Pulmonary Disease

## 2019-02-18 ENCOUNTER — Other Ambulatory Visit (HOSPITAL_COMMUNITY): Payer: Self-pay | Admitting: Pulmonary Disease

## 2019-02-18 ENCOUNTER — Other Ambulatory Visit: Payer: Self-pay

## 2019-02-18 DIAGNOSIS — J449 Chronic obstructive pulmonary disease, unspecified: Secondary | ICD-10-CM | POA: Diagnosis not present

## 2019-02-19 DIAGNOSIS — D631 Anemia in chronic kidney disease: Secondary | ICD-10-CM | POA: Diagnosis not present

## 2019-02-19 DIAGNOSIS — N186 End stage renal disease: Secondary | ICD-10-CM | POA: Diagnosis not present

## 2019-02-19 DIAGNOSIS — D509 Iron deficiency anemia, unspecified: Secondary | ICD-10-CM | POA: Diagnosis not present

## 2019-02-19 DIAGNOSIS — Z992 Dependence on renal dialysis: Secondary | ICD-10-CM | POA: Diagnosis not present

## 2019-02-19 DIAGNOSIS — N2581 Secondary hyperparathyroidism of renal origin: Secondary | ICD-10-CM | POA: Diagnosis not present

## 2019-02-21 DIAGNOSIS — N2581 Secondary hyperparathyroidism of renal origin: Secondary | ICD-10-CM | POA: Diagnosis not present

## 2019-02-21 DIAGNOSIS — Z992 Dependence on renal dialysis: Secondary | ICD-10-CM | POA: Diagnosis not present

## 2019-02-21 DIAGNOSIS — D631 Anemia in chronic kidney disease: Secondary | ICD-10-CM | POA: Diagnosis not present

## 2019-02-21 DIAGNOSIS — N186 End stage renal disease: Secondary | ICD-10-CM | POA: Diagnosis not present

## 2019-02-21 DIAGNOSIS — D509 Iron deficiency anemia, unspecified: Secondary | ICD-10-CM | POA: Diagnosis not present

## 2019-02-24 DIAGNOSIS — Z992 Dependence on renal dialysis: Secondary | ICD-10-CM | POA: Diagnosis not present

## 2019-02-24 DIAGNOSIS — D509 Iron deficiency anemia, unspecified: Secondary | ICD-10-CM | POA: Diagnosis not present

## 2019-02-24 DIAGNOSIS — N2581 Secondary hyperparathyroidism of renal origin: Secondary | ICD-10-CM | POA: Diagnosis not present

## 2019-02-24 DIAGNOSIS — N186 End stage renal disease: Secondary | ICD-10-CM | POA: Diagnosis not present

## 2019-02-24 DIAGNOSIS — D631 Anemia in chronic kidney disease: Secondary | ICD-10-CM | POA: Diagnosis not present

## 2019-02-26 DIAGNOSIS — D631 Anemia in chronic kidney disease: Secondary | ICD-10-CM | POA: Diagnosis not present

## 2019-02-26 DIAGNOSIS — Z992 Dependence on renal dialysis: Secondary | ICD-10-CM | POA: Diagnosis not present

## 2019-02-26 DIAGNOSIS — N2581 Secondary hyperparathyroidism of renal origin: Secondary | ICD-10-CM | POA: Diagnosis not present

## 2019-02-26 DIAGNOSIS — D509 Iron deficiency anemia, unspecified: Secondary | ICD-10-CM | POA: Diagnosis not present

## 2019-02-26 DIAGNOSIS — N186 End stage renal disease: Secondary | ICD-10-CM | POA: Diagnosis not present

## 2019-02-28 DIAGNOSIS — N2581 Secondary hyperparathyroidism of renal origin: Secondary | ICD-10-CM | POA: Diagnosis not present

## 2019-02-28 DIAGNOSIS — D631 Anemia in chronic kidney disease: Secondary | ICD-10-CM | POA: Diagnosis not present

## 2019-02-28 DIAGNOSIS — N186 End stage renal disease: Secondary | ICD-10-CM | POA: Diagnosis not present

## 2019-02-28 DIAGNOSIS — Z992 Dependence on renal dialysis: Secondary | ICD-10-CM | POA: Diagnosis not present

## 2019-02-28 DIAGNOSIS — D509 Iron deficiency anemia, unspecified: Secondary | ICD-10-CM | POA: Diagnosis not present

## 2019-03-03 DIAGNOSIS — Z992 Dependence on renal dialysis: Secondary | ICD-10-CM | POA: Diagnosis not present

## 2019-03-03 DIAGNOSIS — N2581 Secondary hyperparathyroidism of renal origin: Secondary | ICD-10-CM | POA: Diagnosis not present

## 2019-03-03 DIAGNOSIS — D631 Anemia in chronic kidney disease: Secondary | ICD-10-CM | POA: Diagnosis not present

## 2019-03-03 DIAGNOSIS — D509 Iron deficiency anemia, unspecified: Secondary | ICD-10-CM | POA: Diagnosis not present

## 2019-03-03 DIAGNOSIS — N186 End stage renal disease: Secondary | ICD-10-CM | POA: Diagnosis not present

## 2019-03-07 DIAGNOSIS — D631 Anemia in chronic kidney disease: Secondary | ICD-10-CM | POA: Diagnosis not present

## 2019-03-07 DIAGNOSIS — D509 Iron deficiency anemia, unspecified: Secondary | ICD-10-CM | POA: Diagnosis not present

## 2019-03-07 DIAGNOSIS — N2581 Secondary hyperparathyroidism of renal origin: Secondary | ICD-10-CM | POA: Diagnosis not present

## 2019-03-07 DIAGNOSIS — N186 End stage renal disease: Secondary | ICD-10-CM | POA: Diagnosis not present

## 2019-03-07 DIAGNOSIS — Z992 Dependence on renal dialysis: Secondary | ICD-10-CM | POA: Diagnosis not present

## 2019-03-10 DIAGNOSIS — N2581 Secondary hyperparathyroidism of renal origin: Secondary | ICD-10-CM | POA: Diagnosis not present

## 2019-03-10 DIAGNOSIS — D631 Anemia in chronic kidney disease: Secondary | ICD-10-CM | POA: Diagnosis not present

## 2019-03-10 DIAGNOSIS — N186 End stage renal disease: Secondary | ICD-10-CM | POA: Diagnosis not present

## 2019-03-10 DIAGNOSIS — Z992 Dependence on renal dialysis: Secondary | ICD-10-CM | POA: Diagnosis not present

## 2019-03-10 DIAGNOSIS — D509 Iron deficiency anemia, unspecified: Secondary | ICD-10-CM | POA: Diagnosis not present

## 2019-03-12 DIAGNOSIS — N2581 Secondary hyperparathyroidism of renal origin: Secondary | ICD-10-CM | POA: Diagnosis not present

## 2019-03-12 DIAGNOSIS — N186 End stage renal disease: Secondary | ICD-10-CM | POA: Diagnosis not present

## 2019-03-12 DIAGNOSIS — D631 Anemia in chronic kidney disease: Secondary | ICD-10-CM | POA: Diagnosis not present

## 2019-03-12 DIAGNOSIS — D509 Iron deficiency anemia, unspecified: Secondary | ICD-10-CM | POA: Diagnosis not present

## 2019-03-12 DIAGNOSIS — Z992 Dependence on renal dialysis: Secondary | ICD-10-CM | POA: Diagnosis not present

## 2019-03-13 DIAGNOSIS — H40003 Preglaucoma, unspecified, bilateral: Secondary | ICD-10-CM | POA: Diagnosis not present

## 2019-03-14 DIAGNOSIS — N2581 Secondary hyperparathyroidism of renal origin: Secondary | ICD-10-CM | POA: Diagnosis not present

## 2019-03-14 DIAGNOSIS — N186 End stage renal disease: Secondary | ICD-10-CM | POA: Diagnosis not present

## 2019-03-14 DIAGNOSIS — D631 Anemia in chronic kidney disease: Secondary | ICD-10-CM | POA: Diagnosis not present

## 2019-03-14 DIAGNOSIS — Z992 Dependence on renal dialysis: Secondary | ICD-10-CM | POA: Diagnosis not present

## 2019-03-14 DIAGNOSIS — D509 Iron deficiency anemia, unspecified: Secondary | ICD-10-CM | POA: Diagnosis not present

## 2019-03-17 DIAGNOSIS — N2581 Secondary hyperparathyroidism of renal origin: Secondary | ICD-10-CM | POA: Diagnosis not present

## 2019-03-17 DIAGNOSIS — Z992 Dependence on renal dialysis: Secondary | ICD-10-CM | POA: Diagnosis not present

## 2019-03-17 DIAGNOSIS — N186 End stage renal disease: Secondary | ICD-10-CM | POA: Diagnosis not present

## 2019-03-17 DIAGNOSIS — D509 Iron deficiency anemia, unspecified: Secondary | ICD-10-CM | POA: Diagnosis not present

## 2019-03-17 DIAGNOSIS — D631 Anemia in chronic kidney disease: Secondary | ICD-10-CM | POA: Diagnosis not present

## 2019-03-19 DIAGNOSIS — D631 Anemia in chronic kidney disease: Secondary | ICD-10-CM | POA: Diagnosis not present

## 2019-03-19 DIAGNOSIS — Z992 Dependence on renal dialysis: Secondary | ICD-10-CM | POA: Diagnosis not present

## 2019-03-19 DIAGNOSIS — D509 Iron deficiency anemia, unspecified: Secondary | ICD-10-CM | POA: Diagnosis not present

## 2019-03-19 DIAGNOSIS — N186 End stage renal disease: Secondary | ICD-10-CM | POA: Diagnosis not present

## 2019-03-19 DIAGNOSIS — N2581 Secondary hyperparathyroidism of renal origin: Secondary | ICD-10-CM | POA: Diagnosis not present

## 2019-03-21 DIAGNOSIS — D509 Iron deficiency anemia, unspecified: Secondary | ICD-10-CM | POA: Diagnosis not present

## 2019-03-21 DIAGNOSIS — N2581 Secondary hyperparathyroidism of renal origin: Secondary | ICD-10-CM | POA: Diagnosis not present

## 2019-03-21 DIAGNOSIS — Z992 Dependence on renal dialysis: Secondary | ICD-10-CM | POA: Diagnosis not present

## 2019-03-21 DIAGNOSIS — N186 End stage renal disease: Secondary | ICD-10-CM | POA: Diagnosis not present

## 2019-03-21 DIAGNOSIS — D631 Anemia in chronic kidney disease: Secondary | ICD-10-CM | POA: Diagnosis not present

## 2019-03-24 DIAGNOSIS — D509 Iron deficiency anemia, unspecified: Secondary | ICD-10-CM | POA: Diagnosis not present

## 2019-03-24 DIAGNOSIS — D631 Anemia in chronic kidney disease: Secondary | ICD-10-CM | POA: Diagnosis not present

## 2019-03-24 DIAGNOSIS — N2581 Secondary hyperparathyroidism of renal origin: Secondary | ICD-10-CM | POA: Diagnosis not present

## 2019-03-24 DIAGNOSIS — N186 End stage renal disease: Secondary | ICD-10-CM | POA: Diagnosis not present

## 2019-03-24 DIAGNOSIS — Z992 Dependence on renal dialysis: Secondary | ICD-10-CM | POA: Diagnosis not present

## 2019-03-25 DIAGNOSIS — J449 Chronic obstructive pulmonary disease, unspecified: Secondary | ICD-10-CM | POA: Diagnosis not present

## 2019-03-25 DIAGNOSIS — F17200 Nicotine dependence, unspecified, uncomplicated: Secondary | ICD-10-CM | POA: Diagnosis not present

## 2019-03-25 DIAGNOSIS — N186 End stage renal disease: Secondary | ICD-10-CM | POA: Diagnosis not present

## 2019-03-25 DIAGNOSIS — I10 Essential (primary) hypertension: Secondary | ICD-10-CM | POA: Diagnosis not present

## 2019-03-26 DIAGNOSIS — D631 Anemia in chronic kidney disease: Secondary | ICD-10-CM | POA: Diagnosis not present

## 2019-03-26 DIAGNOSIS — N186 End stage renal disease: Secondary | ICD-10-CM | POA: Diagnosis not present

## 2019-03-26 DIAGNOSIS — D509 Iron deficiency anemia, unspecified: Secondary | ICD-10-CM | POA: Diagnosis not present

## 2019-03-26 DIAGNOSIS — N2581 Secondary hyperparathyroidism of renal origin: Secondary | ICD-10-CM | POA: Diagnosis not present

## 2019-03-26 DIAGNOSIS — Z992 Dependence on renal dialysis: Secondary | ICD-10-CM | POA: Diagnosis not present

## 2019-03-28 DIAGNOSIS — N186 End stage renal disease: Secondary | ICD-10-CM | POA: Diagnosis not present

## 2019-03-28 DIAGNOSIS — D631 Anemia in chronic kidney disease: Secondary | ICD-10-CM | POA: Diagnosis not present

## 2019-03-28 DIAGNOSIS — Z992 Dependence on renal dialysis: Secondary | ICD-10-CM | POA: Diagnosis not present

## 2019-03-28 DIAGNOSIS — N2581 Secondary hyperparathyroidism of renal origin: Secondary | ICD-10-CM | POA: Diagnosis not present

## 2019-03-28 DIAGNOSIS — D509 Iron deficiency anemia, unspecified: Secondary | ICD-10-CM | POA: Diagnosis not present

## 2019-03-31 DIAGNOSIS — N2581 Secondary hyperparathyroidism of renal origin: Secondary | ICD-10-CM | POA: Diagnosis not present

## 2019-03-31 DIAGNOSIS — N186 End stage renal disease: Secondary | ICD-10-CM | POA: Diagnosis not present

## 2019-03-31 DIAGNOSIS — D509 Iron deficiency anemia, unspecified: Secondary | ICD-10-CM | POA: Diagnosis not present

## 2019-03-31 DIAGNOSIS — Z992 Dependence on renal dialysis: Secondary | ICD-10-CM | POA: Diagnosis not present

## 2019-03-31 DIAGNOSIS — D631 Anemia in chronic kidney disease: Secondary | ICD-10-CM | POA: Diagnosis not present

## 2019-04-02 DIAGNOSIS — Z992 Dependence on renal dialysis: Secondary | ICD-10-CM | POA: Diagnosis not present

## 2019-04-02 DIAGNOSIS — D631 Anemia in chronic kidney disease: Secondary | ICD-10-CM | POA: Diagnosis not present

## 2019-04-02 DIAGNOSIS — N186 End stage renal disease: Secondary | ICD-10-CM | POA: Diagnosis not present

## 2019-04-02 DIAGNOSIS — D509 Iron deficiency anemia, unspecified: Secondary | ICD-10-CM | POA: Diagnosis not present

## 2019-04-02 DIAGNOSIS — N2581 Secondary hyperparathyroidism of renal origin: Secondary | ICD-10-CM | POA: Diagnosis not present

## 2019-04-04 DIAGNOSIS — N2581 Secondary hyperparathyroidism of renal origin: Secondary | ICD-10-CM | POA: Diagnosis not present

## 2019-04-04 DIAGNOSIS — N186 End stage renal disease: Secondary | ICD-10-CM | POA: Diagnosis not present

## 2019-04-04 DIAGNOSIS — Z992 Dependence on renal dialysis: Secondary | ICD-10-CM | POA: Diagnosis not present

## 2019-04-04 DIAGNOSIS — D631 Anemia in chronic kidney disease: Secondary | ICD-10-CM | POA: Diagnosis not present

## 2019-04-04 DIAGNOSIS — D509 Iron deficiency anemia, unspecified: Secondary | ICD-10-CM | POA: Diagnosis not present

## 2019-04-07 DIAGNOSIS — D509 Iron deficiency anemia, unspecified: Secondary | ICD-10-CM | POA: Diagnosis not present

## 2019-04-07 DIAGNOSIS — Z992 Dependence on renal dialysis: Secondary | ICD-10-CM | POA: Diagnosis not present

## 2019-04-07 DIAGNOSIS — D631 Anemia in chronic kidney disease: Secondary | ICD-10-CM | POA: Diagnosis not present

## 2019-04-07 DIAGNOSIS — N186 End stage renal disease: Secondary | ICD-10-CM | POA: Diagnosis not present

## 2019-04-07 DIAGNOSIS — N2581 Secondary hyperparathyroidism of renal origin: Secondary | ICD-10-CM | POA: Diagnosis not present

## 2019-04-09 DIAGNOSIS — D509 Iron deficiency anemia, unspecified: Secondary | ICD-10-CM | POA: Diagnosis not present

## 2019-04-09 DIAGNOSIS — N186 End stage renal disease: Secondary | ICD-10-CM | POA: Diagnosis not present

## 2019-04-09 DIAGNOSIS — Z992 Dependence on renal dialysis: Secondary | ICD-10-CM | POA: Diagnosis not present

## 2019-04-09 DIAGNOSIS — D631 Anemia in chronic kidney disease: Secondary | ICD-10-CM | POA: Diagnosis not present

## 2019-04-09 DIAGNOSIS — N2581 Secondary hyperparathyroidism of renal origin: Secondary | ICD-10-CM | POA: Diagnosis not present

## 2019-04-11 DIAGNOSIS — N186 End stage renal disease: Secondary | ICD-10-CM | POA: Diagnosis not present

## 2019-04-11 DIAGNOSIS — D509 Iron deficiency anemia, unspecified: Secondary | ICD-10-CM | POA: Diagnosis not present

## 2019-04-11 DIAGNOSIS — D631 Anemia in chronic kidney disease: Secondary | ICD-10-CM | POA: Diagnosis not present

## 2019-04-11 DIAGNOSIS — Z992 Dependence on renal dialysis: Secondary | ICD-10-CM | POA: Diagnosis not present

## 2019-04-11 DIAGNOSIS — Z23 Encounter for immunization: Secondary | ICD-10-CM | POA: Diagnosis not present

## 2019-04-11 DIAGNOSIS — N2581 Secondary hyperparathyroidism of renal origin: Secondary | ICD-10-CM | POA: Diagnosis not present

## 2019-04-14 DIAGNOSIS — Z992 Dependence on renal dialysis: Secondary | ICD-10-CM | POA: Diagnosis not present

## 2019-04-14 DIAGNOSIS — Z23 Encounter for immunization: Secondary | ICD-10-CM | POA: Diagnosis not present

## 2019-04-14 DIAGNOSIS — N2581 Secondary hyperparathyroidism of renal origin: Secondary | ICD-10-CM | POA: Diagnosis not present

## 2019-04-14 DIAGNOSIS — D631 Anemia in chronic kidney disease: Secondary | ICD-10-CM | POA: Diagnosis not present

## 2019-04-14 DIAGNOSIS — D509 Iron deficiency anemia, unspecified: Secondary | ICD-10-CM | POA: Diagnosis not present

## 2019-04-14 DIAGNOSIS — N186 End stage renal disease: Secondary | ICD-10-CM | POA: Diagnosis not present

## 2019-04-18 DIAGNOSIS — N186 End stage renal disease: Secondary | ICD-10-CM | POA: Diagnosis not present

## 2019-04-18 DIAGNOSIS — D509 Iron deficiency anemia, unspecified: Secondary | ICD-10-CM | POA: Diagnosis not present

## 2019-04-18 DIAGNOSIS — Z23 Encounter for immunization: Secondary | ICD-10-CM | POA: Diagnosis not present

## 2019-04-18 DIAGNOSIS — N2581 Secondary hyperparathyroidism of renal origin: Secondary | ICD-10-CM | POA: Diagnosis not present

## 2019-04-18 DIAGNOSIS — D631 Anemia in chronic kidney disease: Secondary | ICD-10-CM | POA: Diagnosis not present

## 2019-04-18 DIAGNOSIS — Z992 Dependence on renal dialysis: Secondary | ICD-10-CM | POA: Diagnosis not present

## 2019-04-21 DIAGNOSIS — Z992 Dependence on renal dialysis: Secondary | ICD-10-CM | POA: Diagnosis not present

## 2019-04-21 DIAGNOSIS — D631 Anemia in chronic kidney disease: Secondary | ICD-10-CM | POA: Diagnosis not present

## 2019-04-21 DIAGNOSIS — N186 End stage renal disease: Secondary | ICD-10-CM | POA: Diagnosis not present

## 2019-04-21 DIAGNOSIS — N2581 Secondary hyperparathyroidism of renal origin: Secondary | ICD-10-CM | POA: Diagnosis not present

## 2019-04-21 DIAGNOSIS — D509 Iron deficiency anemia, unspecified: Secondary | ICD-10-CM | POA: Diagnosis not present

## 2019-04-21 DIAGNOSIS — Z23 Encounter for immunization: Secondary | ICD-10-CM | POA: Diagnosis not present

## 2019-04-23 DIAGNOSIS — Z23 Encounter for immunization: Secondary | ICD-10-CM | POA: Diagnosis not present

## 2019-04-23 DIAGNOSIS — D509 Iron deficiency anemia, unspecified: Secondary | ICD-10-CM | POA: Diagnosis not present

## 2019-04-23 DIAGNOSIS — N2581 Secondary hyperparathyroidism of renal origin: Secondary | ICD-10-CM | POA: Diagnosis not present

## 2019-04-23 DIAGNOSIS — D631 Anemia in chronic kidney disease: Secondary | ICD-10-CM | POA: Diagnosis not present

## 2019-04-23 DIAGNOSIS — N186 End stage renal disease: Secondary | ICD-10-CM | POA: Diagnosis not present

## 2019-04-23 DIAGNOSIS — Z992 Dependence on renal dialysis: Secondary | ICD-10-CM | POA: Diagnosis not present

## 2019-04-25 DIAGNOSIS — N2581 Secondary hyperparathyroidism of renal origin: Secondary | ICD-10-CM | POA: Diagnosis not present

## 2019-04-25 DIAGNOSIS — Z992 Dependence on renal dialysis: Secondary | ICD-10-CM | POA: Diagnosis not present

## 2019-04-25 DIAGNOSIS — N186 End stage renal disease: Secondary | ICD-10-CM | POA: Diagnosis not present

## 2019-04-25 DIAGNOSIS — Z23 Encounter for immunization: Secondary | ICD-10-CM | POA: Diagnosis not present

## 2019-04-25 DIAGNOSIS — D509 Iron deficiency anemia, unspecified: Secondary | ICD-10-CM | POA: Diagnosis not present

## 2019-04-25 DIAGNOSIS — D631 Anemia in chronic kidney disease: Secondary | ICD-10-CM | POA: Diagnosis not present

## 2019-04-28 DIAGNOSIS — D509 Iron deficiency anemia, unspecified: Secondary | ICD-10-CM | POA: Diagnosis not present

## 2019-04-28 DIAGNOSIS — N186 End stage renal disease: Secondary | ICD-10-CM | POA: Diagnosis not present

## 2019-04-28 DIAGNOSIS — D631 Anemia in chronic kidney disease: Secondary | ICD-10-CM | POA: Diagnosis not present

## 2019-04-28 DIAGNOSIS — Z992 Dependence on renal dialysis: Secondary | ICD-10-CM | POA: Diagnosis not present

## 2019-04-28 DIAGNOSIS — Z23 Encounter for immunization: Secondary | ICD-10-CM | POA: Diagnosis not present

## 2019-04-28 DIAGNOSIS — N2581 Secondary hyperparathyroidism of renal origin: Secondary | ICD-10-CM | POA: Diagnosis not present

## 2019-04-30 DIAGNOSIS — D509 Iron deficiency anemia, unspecified: Secondary | ICD-10-CM | POA: Diagnosis not present

## 2019-04-30 DIAGNOSIS — Z23 Encounter for immunization: Secondary | ICD-10-CM | POA: Diagnosis not present

## 2019-04-30 DIAGNOSIS — N186 End stage renal disease: Secondary | ICD-10-CM | POA: Diagnosis not present

## 2019-04-30 DIAGNOSIS — N2581 Secondary hyperparathyroidism of renal origin: Secondary | ICD-10-CM | POA: Diagnosis not present

## 2019-04-30 DIAGNOSIS — D631 Anemia in chronic kidney disease: Secondary | ICD-10-CM | POA: Diagnosis not present

## 2019-04-30 DIAGNOSIS — Z992 Dependence on renal dialysis: Secondary | ICD-10-CM | POA: Diagnosis not present

## 2019-05-02 DIAGNOSIS — Z992 Dependence on renal dialysis: Secondary | ICD-10-CM | POA: Diagnosis not present

## 2019-05-02 DIAGNOSIS — Z23 Encounter for immunization: Secondary | ICD-10-CM | POA: Diagnosis not present

## 2019-05-02 DIAGNOSIS — N186 End stage renal disease: Secondary | ICD-10-CM | POA: Diagnosis not present

## 2019-05-02 DIAGNOSIS — D509 Iron deficiency anemia, unspecified: Secondary | ICD-10-CM | POA: Diagnosis not present

## 2019-05-02 DIAGNOSIS — N2581 Secondary hyperparathyroidism of renal origin: Secondary | ICD-10-CM | POA: Diagnosis not present

## 2019-05-02 DIAGNOSIS — D631 Anemia in chronic kidney disease: Secondary | ICD-10-CM | POA: Diagnosis not present

## 2019-05-05 DIAGNOSIS — Z23 Encounter for immunization: Secondary | ICD-10-CM | POA: Diagnosis not present

## 2019-05-05 DIAGNOSIS — D509 Iron deficiency anemia, unspecified: Secondary | ICD-10-CM | POA: Diagnosis not present

## 2019-05-05 DIAGNOSIS — Z992 Dependence on renal dialysis: Secondary | ICD-10-CM | POA: Diagnosis not present

## 2019-05-05 DIAGNOSIS — D631 Anemia in chronic kidney disease: Secondary | ICD-10-CM | POA: Diagnosis not present

## 2019-05-05 DIAGNOSIS — N186 End stage renal disease: Secondary | ICD-10-CM | POA: Diagnosis not present

## 2019-05-05 DIAGNOSIS — N2581 Secondary hyperparathyroidism of renal origin: Secondary | ICD-10-CM | POA: Diagnosis not present

## 2019-05-07 DIAGNOSIS — N186 End stage renal disease: Secondary | ICD-10-CM | POA: Diagnosis not present

## 2019-05-07 DIAGNOSIS — D631 Anemia in chronic kidney disease: Secondary | ICD-10-CM | POA: Diagnosis not present

## 2019-05-07 DIAGNOSIS — Z992 Dependence on renal dialysis: Secondary | ICD-10-CM | POA: Diagnosis not present

## 2019-05-07 DIAGNOSIS — N2581 Secondary hyperparathyroidism of renal origin: Secondary | ICD-10-CM | POA: Diagnosis not present

## 2019-05-07 DIAGNOSIS — Z23 Encounter for immunization: Secondary | ICD-10-CM | POA: Diagnosis not present

## 2019-05-07 DIAGNOSIS — D509 Iron deficiency anemia, unspecified: Secondary | ICD-10-CM | POA: Diagnosis not present

## 2019-05-09 DIAGNOSIS — Z23 Encounter for immunization: Secondary | ICD-10-CM | POA: Diagnosis not present

## 2019-05-09 DIAGNOSIS — N186 End stage renal disease: Secondary | ICD-10-CM | POA: Diagnosis not present

## 2019-05-09 DIAGNOSIS — D509 Iron deficiency anemia, unspecified: Secondary | ICD-10-CM | POA: Diagnosis not present

## 2019-05-09 DIAGNOSIS — N2581 Secondary hyperparathyroidism of renal origin: Secondary | ICD-10-CM | POA: Diagnosis not present

## 2019-05-09 DIAGNOSIS — Z992 Dependence on renal dialysis: Secondary | ICD-10-CM | POA: Diagnosis not present

## 2019-05-09 DIAGNOSIS — D631 Anemia in chronic kidney disease: Secondary | ICD-10-CM | POA: Diagnosis not present

## 2019-05-10 DIAGNOSIS — N186 End stage renal disease: Secondary | ICD-10-CM | POA: Diagnosis not present

## 2019-05-10 DIAGNOSIS — Z992 Dependence on renal dialysis: Secondary | ICD-10-CM | POA: Diagnosis not present

## 2019-05-12 DIAGNOSIS — D631 Anemia in chronic kidney disease: Secondary | ICD-10-CM | POA: Diagnosis not present

## 2019-05-12 DIAGNOSIS — D509 Iron deficiency anemia, unspecified: Secondary | ICD-10-CM | POA: Diagnosis not present

## 2019-05-12 DIAGNOSIS — N2581 Secondary hyperparathyroidism of renal origin: Secondary | ICD-10-CM | POA: Diagnosis not present

## 2019-05-12 DIAGNOSIS — N186 End stage renal disease: Secondary | ICD-10-CM | POA: Diagnosis not present

## 2019-05-12 DIAGNOSIS — Z992 Dependence on renal dialysis: Secondary | ICD-10-CM | POA: Diagnosis not present

## 2019-05-14 DIAGNOSIS — Z992 Dependence on renal dialysis: Secondary | ICD-10-CM | POA: Diagnosis not present

## 2019-05-14 DIAGNOSIS — D509 Iron deficiency anemia, unspecified: Secondary | ICD-10-CM | POA: Diagnosis not present

## 2019-05-14 DIAGNOSIS — N2581 Secondary hyperparathyroidism of renal origin: Secondary | ICD-10-CM | POA: Diagnosis not present

## 2019-05-14 DIAGNOSIS — D631 Anemia in chronic kidney disease: Secondary | ICD-10-CM | POA: Diagnosis not present

## 2019-05-14 DIAGNOSIS — N186 End stage renal disease: Secondary | ICD-10-CM | POA: Diagnosis not present

## 2019-05-16 DIAGNOSIS — N186 End stage renal disease: Secondary | ICD-10-CM | POA: Diagnosis not present

## 2019-05-16 DIAGNOSIS — N2581 Secondary hyperparathyroidism of renal origin: Secondary | ICD-10-CM | POA: Diagnosis not present

## 2019-05-16 DIAGNOSIS — D509 Iron deficiency anemia, unspecified: Secondary | ICD-10-CM | POA: Diagnosis not present

## 2019-05-16 DIAGNOSIS — D631 Anemia in chronic kidney disease: Secondary | ICD-10-CM | POA: Diagnosis not present

## 2019-05-16 DIAGNOSIS — Z992 Dependence on renal dialysis: Secondary | ICD-10-CM | POA: Diagnosis not present

## 2019-05-19 DIAGNOSIS — N186 End stage renal disease: Secondary | ICD-10-CM | POA: Diagnosis not present

## 2019-05-19 DIAGNOSIS — N2581 Secondary hyperparathyroidism of renal origin: Secondary | ICD-10-CM | POA: Diagnosis not present

## 2019-05-19 DIAGNOSIS — D509 Iron deficiency anemia, unspecified: Secondary | ICD-10-CM | POA: Diagnosis not present

## 2019-05-19 DIAGNOSIS — D631 Anemia in chronic kidney disease: Secondary | ICD-10-CM | POA: Diagnosis not present

## 2019-05-19 DIAGNOSIS — Z992 Dependence on renal dialysis: Secondary | ICD-10-CM | POA: Diagnosis not present

## 2019-05-21 DIAGNOSIS — N186 End stage renal disease: Secondary | ICD-10-CM | POA: Diagnosis not present

## 2019-05-21 DIAGNOSIS — N2581 Secondary hyperparathyroidism of renal origin: Secondary | ICD-10-CM | POA: Diagnosis not present

## 2019-05-21 DIAGNOSIS — D509 Iron deficiency anemia, unspecified: Secondary | ICD-10-CM | POA: Diagnosis not present

## 2019-05-21 DIAGNOSIS — Z992 Dependence on renal dialysis: Secondary | ICD-10-CM | POA: Diagnosis not present

## 2019-05-21 DIAGNOSIS — D631 Anemia in chronic kidney disease: Secondary | ICD-10-CM | POA: Diagnosis not present

## 2019-05-23 DIAGNOSIS — D631 Anemia in chronic kidney disease: Secondary | ICD-10-CM | POA: Diagnosis not present

## 2019-05-23 DIAGNOSIS — Z992 Dependence on renal dialysis: Secondary | ICD-10-CM | POA: Diagnosis not present

## 2019-05-23 DIAGNOSIS — D509 Iron deficiency anemia, unspecified: Secondary | ICD-10-CM | POA: Diagnosis not present

## 2019-05-23 DIAGNOSIS — N2581 Secondary hyperparathyroidism of renal origin: Secondary | ICD-10-CM | POA: Diagnosis not present

## 2019-05-23 DIAGNOSIS — N186 End stage renal disease: Secondary | ICD-10-CM | POA: Diagnosis not present

## 2019-05-26 DIAGNOSIS — Z992 Dependence on renal dialysis: Secondary | ICD-10-CM | POA: Diagnosis not present

## 2019-05-26 DIAGNOSIS — D509 Iron deficiency anemia, unspecified: Secondary | ICD-10-CM | POA: Diagnosis not present

## 2019-05-26 DIAGNOSIS — D631 Anemia in chronic kidney disease: Secondary | ICD-10-CM | POA: Diagnosis not present

## 2019-05-26 DIAGNOSIS — N2581 Secondary hyperparathyroidism of renal origin: Secondary | ICD-10-CM | POA: Diagnosis not present

## 2019-05-26 DIAGNOSIS — N186 End stage renal disease: Secondary | ICD-10-CM | POA: Diagnosis not present

## 2019-05-30 DIAGNOSIS — N186 End stage renal disease: Secondary | ICD-10-CM | POA: Diagnosis not present

## 2019-05-30 DIAGNOSIS — Z992 Dependence on renal dialysis: Secondary | ICD-10-CM | POA: Diagnosis not present

## 2019-05-30 DIAGNOSIS — D631 Anemia in chronic kidney disease: Secondary | ICD-10-CM | POA: Diagnosis not present

## 2019-05-30 DIAGNOSIS — N2581 Secondary hyperparathyroidism of renal origin: Secondary | ICD-10-CM | POA: Diagnosis not present

## 2019-05-30 DIAGNOSIS — D509 Iron deficiency anemia, unspecified: Secondary | ICD-10-CM | POA: Diagnosis not present

## 2019-06-02 DIAGNOSIS — N2581 Secondary hyperparathyroidism of renal origin: Secondary | ICD-10-CM | POA: Diagnosis not present

## 2019-06-02 DIAGNOSIS — N186 End stage renal disease: Secondary | ICD-10-CM | POA: Diagnosis not present

## 2019-06-02 DIAGNOSIS — D631 Anemia in chronic kidney disease: Secondary | ICD-10-CM | POA: Diagnosis not present

## 2019-06-02 DIAGNOSIS — Z992 Dependence on renal dialysis: Secondary | ICD-10-CM | POA: Diagnosis not present

## 2019-06-02 DIAGNOSIS — D509 Iron deficiency anemia, unspecified: Secondary | ICD-10-CM | POA: Diagnosis not present

## 2019-06-04 DIAGNOSIS — D631 Anemia in chronic kidney disease: Secondary | ICD-10-CM | POA: Diagnosis not present

## 2019-06-04 DIAGNOSIS — D509 Iron deficiency anemia, unspecified: Secondary | ICD-10-CM | POA: Diagnosis not present

## 2019-06-04 DIAGNOSIS — N186 End stage renal disease: Secondary | ICD-10-CM | POA: Diagnosis not present

## 2019-06-04 DIAGNOSIS — Z992 Dependence on renal dialysis: Secondary | ICD-10-CM | POA: Diagnosis not present

## 2019-06-04 DIAGNOSIS — N2581 Secondary hyperparathyroidism of renal origin: Secondary | ICD-10-CM | POA: Diagnosis not present

## 2019-06-07 DIAGNOSIS — D631 Anemia in chronic kidney disease: Secondary | ICD-10-CM | POA: Diagnosis not present

## 2019-06-07 DIAGNOSIS — D509 Iron deficiency anemia, unspecified: Secondary | ICD-10-CM | POA: Diagnosis not present

## 2019-06-07 DIAGNOSIS — N2581 Secondary hyperparathyroidism of renal origin: Secondary | ICD-10-CM | POA: Diagnosis not present

## 2019-06-07 DIAGNOSIS — Z992 Dependence on renal dialysis: Secondary | ICD-10-CM | POA: Diagnosis not present

## 2019-06-07 DIAGNOSIS — N186 End stage renal disease: Secondary | ICD-10-CM | POA: Diagnosis not present

## 2019-06-09 DIAGNOSIS — D631 Anemia in chronic kidney disease: Secondary | ICD-10-CM | POA: Diagnosis not present

## 2019-06-09 DIAGNOSIS — N186 End stage renal disease: Secondary | ICD-10-CM | POA: Diagnosis not present

## 2019-06-09 DIAGNOSIS — N2581 Secondary hyperparathyroidism of renal origin: Secondary | ICD-10-CM | POA: Diagnosis not present

## 2019-06-09 DIAGNOSIS — D509 Iron deficiency anemia, unspecified: Secondary | ICD-10-CM | POA: Diagnosis not present

## 2019-06-09 DIAGNOSIS — Z992 Dependence on renal dialysis: Secondary | ICD-10-CM | POA: Diagnosis not present

## 2019-07-02 ENCOUNTER — Other Ambulatory Visit (HOSPITAL_COMMUNITY): Payer: Self-pay | Admitting: Internal Medicine

## 2019-07-02 ENCOUNTER — Other Ambulatory Visit: Payer: Self-pay | Admitting: Internal Medicine

## 2019-07-02 DIAGNOSIS — M79605 Pain in left leg: Secondary | ICD-10-CM

## 2019-07-02 DIAGNOSIS — M79604 Pain in right leg: Secondary | ICD-10-CM

## 2019-07-02 DIAGNOSIS — I739 Peripheral vascular disease, unspecified: Secondary | ICD-10-CM

## 2019-07-10 ENCOUNTER — Ambulatory Visit (HOSPITAL_COMMUNITY)
Admission: RE | Admit: 2019-07-10 | Discharge: 2019-07-10 | Disposition: A | Discharge status: Home / Self Care | Payer: Medicare Other | Source: Ambulatory Visit | Attending: Internal Medicine | Admitting: Internal Medicine

## 2019-07-10 ENCOUNTER — Other Ambulatory Visit: Payer: Self-pay

## 2019-07-10 DIAGNOSIS — M79604 Pain in right leg: Secondary | ICD-10-CM | POA: Insufficient documentation

## 2019-07-10 DIAGNOSIS — I739 Peripheral vascular disease, unspecified: Secondary | ICD-10-CM | POA: Insufficient documentation

## 2019-07-10 DIAGNOSIS — M79605 Pain in left leg: Secondary | ICD-10-CM | POA: Insufficient documentation

## 2019-07-11 DIAGNOSIS — N2581 Secondary hyperparathyroidism of renal origin: Secondary | ICD-10-CM | POA: Diagnosis not present

## 2019-07-11 DIAGNOSIS — D509 Iron deficiency anemia, unspecified: Secondary | ICD-10-CM | POA: Diagnosis not present

## 2019-07-11 DIAGNOSIS — D631 Anemia in chronic kidney disease: Secondary | ICD-10-CM | POA: Diagnosis not present

## 2019-07-11 DIAGNOSIS — N186 End stage renal disease: Secondary | ICD-10-CM | POA: Diagnosis not present

## 2019-07-11 DIAGNOSIS — Z992 Dependence on renal dialysis: Secondary | ICD-10-CM | POA: Diagnosis not present

## 2019-07-14 DIAGNOSIS — D509 Iron deficiency anemia, unspecified: Secondary | ICD-10-CM | POA: Diagnosis not present

## 2019-07-14 DIAGNOSIS — D631 Anemia in chronic kidney disease: Secondary | ICD-10-CM | POA: Diagnosis not present

## 2019-07-14 DIAGNOSIS — Z992 Dependence on renal dialysis: Secondary | ICD-10-CM | POA: Diagnosis not present

## 2019-07-14 DIAGNOSIS — N2581 Secondary hyperparathyroidism of renal origin: Secondary | ICD-10-CM | POA: Diagnosis not present

## 2019-07-14 DIAGNOSIS — N186 End stage renal disease: Secondary | ICD-10-CM | POA: Diagnosis not present

## 2019-07-16 ENCOUNTER — Encounter: Payer: Self-pay | Admitting: Gastroenterology

## 2019-07-16 DIAGNOSIS — N2581 Secondary hyperparathyroidism of renal origin: Secondary | ICD-10-CM | POA: Diagnosis not present

## 2019-07-16 DIAGNOSIS — Z992 Dependence on renal dialysis: Secondary | ICD-10-CM | POA: Diagnosis not present

## 2019-07-16 DIAGNOSIS — N186 End stage renal disease: Secondary | ICD-10-CM | POA: Diagnosis not present

## 2019-07-16 DIAGNOSIS — D509 Iron deficiency anemia, unspecified: Secondary | ICD-10-CM | POA: Diagnosis not present

## 2019-07-16 DIAGNOSIS — D631 Anemia in chronic kidney disease: Secondary | ICD-10-CM | POA: Diagnosis not present

## 2019-07-18 DIAGNOSIS — D509 Iron deficiency anemia, unspecified: Secondary | ICD-10-CM | POA: Diagnosis not present

## 2019-07-18 DIAGNOSIS — N2581 Secondary hyperparathyroidism of renal origin: Secondary | ICD-10-CM | POA: Diagnosis not present

## 2019-07-18 DIAGNOSIS — Z992 Dependence on renal dialysis: Secondary | ICD-10-CM | POA: Diagnosis not present

## 2019-07-18 DIAGNOSIS — N186 End stage renal disease: Secondary | ICD-10-CM | POA: Diagnosis not present

## 2019-07-18 DIAGNOSIS — D631 Anemia in chronic kidney disease: Secondary | ICD-10-CM | POA: Diagnosis not present

## 2019-07-21 DIAGNOSIS — Z992 Dependence on renal dialysis: Secondary | ICD-10-CM | POA: Diagnosis not present

## 2019-07-21 DIAGNOSIS — D509 Iron deficiency anemia, unspecified: Secondary | ICD-10-CM | POA: Diagnosis not present

## 2019-07-21 DIAGNOSIS — N186 End stage renal disease: Secondary | ICD-10-CM | POA: Diagnosis not present

## 2019-07-21 DIAGNOSIS — N2581 Secondary hyperparathyroidism of renal origin: Secondary | ICD-10-CM | POA: Diagnosis not present

## 2019-07-21 DIAGNOSIS — D631 Anemia in chronic kidney disease: Secondary | ICD-10-CM | POA: Diagnosis not present

## 2019-07-23 DIAGNOSIS — Z992 Dependence on renal dialysis: Secondary | ICD-10-CM | POA: Diagnosis not present

## 2019-07-23 DIAGNOSIS — N186 End stage renal disease: Secondary | ICD-10-CM | POA: Diagnosis not present

## 2019-07-23 DIAGNOSIS — D631 Anemia in chronic kidney disease: Secondary | ICD-10-CM | POA: Diagnosis not present

## 2019-07-23 DIAGNOSIS — D509 Iron deficiency anemia, unspecified: Secondary | ICD-10-CM | POA: Diagnosis not present

## 2019-07-23 DIAGNOSIS — N2581 Secondary hyperparathyroidism of renal origin: Secondary | ICD-10-CM | POA: Diagnosis not present

## 2019-07-28 DIAGNOSIS — D509 Iron deficiency anemia, unspecified: Secondary | ICD-10-CM | POA: Diagnosis not present

## 2019-07-28 DIAGNOSIS — D631 Anemia in chronic kidney disease: Secondary | ICD-10-CM | POA: Diagnosis not present

## 2019-07-28 DIAGNOSIS — Z992 Dependence on renal dialysis: Secondary | ICD-10-CM | POA: Diagnosis not present

## 2019-07-28 DIAGNOSIS — N2581 Secondary hyperparathyroidism of renal origin: Secondary | ICD-10-CM | POA: Diagnosis not present

## 2019-07-28 DIAGNOSIS — N186 End stage renal disease: Secondary | ICD-10-CM | POA: Diagnosis not present

## 2019-07-30 ENCOUNTER — Encounter: Payer: Self-pay | Admitting: Gastroenterology

## 2019-07-30 DIAGNOSIS — D509 Iron deficiency anemia, unspecified: Secondary | ICD-10-CM | POA: Diagnosis not present

## 2019-07-30 DIAGNOSIS — N186 End stage renal disease: Secondary | ICD-10-CM | POA: Diagnosis not present

## 2019-07-30 DIAGNOSIS — D631 Anemia in chronic kidney disease: Secondary | ICD-10-CM | POA: Diagnosis not present

## 2019-07-30 DIAGNOSIS — N2581 Secondary hyperparathyroidism of renal origin: Secondary | ICD-10-CM | POA: Diagnosis not present

## 2019-07-30 DIAGNOSIS — Z992 Dependence on renal dialysis: Secondary | ICD-10-CM | POA: Diagnosis not present
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
## 2019-07-31 ENCOUNTER — Other Ambulatory Visit: Payer: Self-pay

## 2019-07-31 ENCOUNTER — Encounter: Payer: Self-pay | Admitting: Gastroenterology

## 2019-07-31 ENCOUNTER — Ambulatory Visit (INDEPENDENT_AMBULATORY_CARE_PROVIDER_SITE_OTHER): Payer: Medicare Other | Admitting: Gastroenterology

## 2019-07-31 ENCOUNTER — Telehealth: Payer: Self-pay | Admitting: Gastroenterology

## 2019-07-31 ENCOUNTER — Other Ambulatory Visit: Payer: Self-pay | Admitting: *Deleted

## 2019-07-31 VITALS — BP 138/74 | HR 59 | Temp 97.1°F | Ht 70.0 in | Wt 119.0 lb

## 2019-07-31 DIAGNOSIS — Z992 Dependence on renal dialysis: Secondary | ICD-10-CM | POA: Diagnosis not present

## 2019-07-31 DIAGNOSIS — D649 Anemia, unspecified: Secondary | ICD-10-CM | POA: Diagnosis not present

## 2019-07-31 DIAGNOSIS — I739 Peripheral vascular disease, unspecified: Secondary | ICD-10-CM | POA: Diagnosis not present

## 2019-07-31 DIAGNOSIS — D539 Nutritional anemia, unspecified: Secondary | ICD-10-CM | POA: Insufficient documentation

## 2019-07-31 DIAGNOSIS — K648 Other hemorrhoids: Secondary | ICD-10-CM

## 2019-07-31 DIAGNOSIS — K219 Gastro-esophageal reflux disease without esophagitis: Secondary | ICD-10-CM

## 2019-07-31 DIAGNOSIS — J449 Chronic obstructive pulmonary disease, unspecified: Secondary | ICD-10-CM | POA: Diagnosis not present

## 2019-07-31 DIAGNOSIS — N186 End stage renal disease: Secondary | ICD-10-CM | POA: Diagnosis not present

## 2019-07-31 DIAGNOSIS — R6889 Other general symptoms and signs: Secondary | ICD-10-CM

## 2019-07-31 DIAGNOSIS — I120 Hypertensive chronic kidney disease with stage 5 chronic kidney disease or end stage renal disease: Secondary | ICD-10-CM

## 2019-07-31 MED ORDER — PEG 3350-KCL-NA BICARB-NACL 420 G PO SOLR: 4000.0000 mL | ORAL | 0 refills | Status: DC

## 2019-07-31 NOTE — Patient Instructions (Signed)
Please have labs completed.  We will get you scheduled for a colonoscopy with possible upper endoscopy to evaluate your worsening anemia.  We will follow up with you in the office as recommended at the time of your procedures.  Aliene Altes, PA-C [facility]

## 2019-07-31 NOTE — Assessment & Plan Note (Addendum)
59 year old male with history significant for end-stage renal disease on dialysis 3 days a week, COPD, PVD recently started on cilostazol, HTN, and GERD presenting for further evaluation of worsening anemia.  Per chart review, appears hemoglobin has been in the 9-11 range at least since 2015.  Received labs from dialysis.  Hemoglobin 13.1 on 04/19/2019 and has trended down as low as 7.2 on 06/28/2019. Last lab we have is from 07/05/2019 with hemoglobin 7.7.  It appears he is receiving IV iron and Epogen at dialysis.  Iron panel on 12/19 with ferritin 1229, iron 199, saturation 96%. He is without any overt GI bleeding.  No significant upper or lower GI symptoms.  GERD is well controlled on omeprazole daily.  8 pound weight loss over the last year.  Per patient, this is due to having to watch everything he eats because of dialysis.  Last colonoscopy in 2013 normal other than large internal hemorrhoids.  Unclear source of worsening anemia.  Cannot rule out occult malignancy.  Possible oozing AVMs. Less suspicious for upper GI source as he has remained on omeprazole 40 mg daily is without any upper GI symptoms.   Plan: Check B12 and folate. Proceed with TCS +/- EGD with propofol with Dr. Oneida Alar in the near future. The risks, benefits, and alternatives have been discussed in detail with patient. They have stated understanding and desire to proceed.  Propofol due to medical history, alcohol, history of cocaine use (although reports none in 20 years).  UDS at preop. Patient is on cilostazol which will be continued prior to procedure. Follow-up as recommended at the time of procedure.

## 2019-07-31 NOTE — Addendum Note (Signed)
Addended by: Hassan Rowan on: 07/31/2019 01:25 PM   Modules accepted: Orders

## 2019-07-31 NOTE — Progress Notes (Signed)
urin

## 2019-07-31 NOTE — Telephone Encounter (Signed)
Please add UDS to preop.  Although patient denies any recent drug use, he does have history of cocaine use many years ago.

## 2019-07-31 NOTE — Telephone Encounter (Signed)
Order entered for UDS. Endo scheduler informed.

## 2019-08-01 DIAGNOSIS — Z992 Dependence on renal dialysis: Secondary | ICD-10-CM | POA: Diagnosis not present

## 2019-08-01 DIAGNOSIS — N186 End stage renal disease: Secondary | ICD-10-CM | POA: Diagnosis not present

## 2019-08-01 DIAGNOSIS — D509 Iron deficiency anemia, unspecified: Secondary | ICD-10-CM | POA: Diagnosis not present

## 2019-08-01 DIAGNOSIS — D631 Anemia in chronic kidney disease: Secondary | ICD-10-CM | POA: Diagnosis not present

## 2019-08-01 DIAGNOSIS — N2581 Secondary hyperparathyroidism of renal origin: Secondary | ICD-10-CM | POA: Diagnosis not present

## 2019-08-02 LAB — B12 AND FOLATE PANEL
Folate: >24 ng/mL
Vitamin B-12: 1026 pg/mL (ref 200–1100)

## 2019-08-03 NOTE — Progress Notes (Signed)
B12 and Folate within normal limits.

## 2019-08-06 DIAGNOSIS — N2581 Secondary hyperparathyroidism of renal origin: Secondary | ICD-10-CM | POA: Diagnosis not present

## 2019-08-06 DIAGNOSIS — N186 End stage renal disease: Secondary | ICD-10-CM | POA: Diagnosis not present

## 2019-08-06 DIAGNOSIS — Z992 Dependence on renal dialysis: Secondary | ICD-10-CM | POA: Diagnosis not present

## 2019-08-06 DIAGNOSIS — D509 Iron deficiency anemia, unspecified: Secondary | ICD-10-CM | POA: Diagnosis not present

## 2019-08-06 DIAGNOSIS — D631 Anemia in chronic kidney disease: Secondary | ICD-10-CM | POA: Diagnosis not present

## 2019-08-08 DIAGNOSIS — N186 End stage renal disease: Secondary | ICD-10-CM | POA: Diagnosis not present

## 2019-08-08 DIAGNOSIS — N2581 Secondary hyperparathyroidism of renal origin: Secondary | ICD-10-CM | POA: Diagnosis not present

## 2019-08-08 DIAGNOSIS — Z992 Dependence on renal dialysis: Secondary | ICD-10-CM | POA: Diagnosis not present

## 2019-08-08 DIAGNOSIS — D509 Iron deficiency anemia, unspecified: Secondary | ICD-10-CM | POA: Diagnosis not present

## 2019-08-08 DIAGNOSIS — D631 Anemia in chronic kidney disease: Secondary | ICD-10-CM | POA: Diagnosis not present

## 2019-08-11 DIAGNOSIS — N2581 Secondary hyperparathyroidism of renal origin: Secondary | ICD-10-CM | POA: Diagnosis not present

## 2019-08-11 DIAGNOSIS — Z992 Dependence on renal dialysis: Secondary | ICD-10-CM | POA: Diagnosis not present

## 2019-08-11 DIAGNOSIS — D631 Anemia in chronic kidney disease: Secondary | ICD-10-CM | POA: Diagnosis not present

## 2019-08-11 DIAGNOSIS — N186 End stage renal disease: Secondary | ICD-10-CM | POA: Diagnosis not present

## 2019-08-11 DIAGNOSIS — D509 Iron deficiency anemia, unspecified: Secondary | ICD-10-CM | POA: Diagnosis not present

## 2019-08-13 DIAGNOSIS — D631 Anemia in chronic kidney disease: Secondary | ICD-10-CM | POA: Diagnosis not present

## 2019-08-13 DIAGNOSIS — N186 End stage renal disease: Secondary | ICD-10-CM | POA: Diagnosis not present

## 2019-08-13 DIAGNOSIS — Z992 Dependence on renal dialysis: Secondary | ICD-10-CM | POA: Diagnosis not present

## 2019-08-13 DIAGNOSIS — N2581 Secondary hyperparathyroidism of renal origin: Secondary | ICD-10-CM | POA: Diagnosis not present

## 2019-08-13 DIAGNOSIS — D509 Iron deficiency anemia, unspecified: Secondary | ICD-10-CM | POA: Diagnosis not present

## 2019-08-15 DIAGNOSIS — D509 Iron deficiency anemia, unspecified: Secondary | ICD-10-CM | POA: Diagnosis not present

## 2019-08-15 DIAGNOSIS — N2581 Secondary hyperparathyroidism of renal origin: Secondary | ICD-10-CM | POA: Diagnosis not present

## 2019-08-15 DIAGNOSIS — D631 Anemia in chronic kidney disease: Secondary | ICD-10-CM | POA: Diagnosis not present

## 2019-08-15 DIAGNOSIS — N186 End stage renal disease: Secondary | ICD-10-CM | POA: Diagnosis not present

## 2019-08-15 DIAGNOSIS — Z992 Dependence on renal dialysis: Secondary | ICD-10-CM | POA: Diagnosis not present

## 2019-08-18 DIAGNOSIS — N186 End stage renal disease: Secondary | ICD-10-CM | POA: Diagnosis not present

## 2019-08-18 DIAGNOSIS — D509 Iron deficiency anemia, unspecified: Secondary | ICD-10-CM | POA: Diagnosis not present

## 2019-08-18 DIAGNOSIS — D631 Anemia in chronic kidney disease: Secondary | ICD-10-CM | POA: Diagnosis not present

## 2019-08-18 DIAGNOSIS — Z992 Dependence on renal dialysis: Secondary | ICD-10-CM | POA: Diagnosis not present

## 2019-08-18 DIAGNOSIS — N2581 Secondary hyperparathyroidism of renal origin: Secondary | ICD-10-CM | POA: Diagnosis not present

## 2019-08-20 DIAGNOSIS — N2581 Secondary hyperparathyroidism of renal origin: Secondary | ICD-10-CM | POA: Diagnosis not present

## 2019-08-20 DIAGNOSIS — D631 Anemia in chronic kidney disease: Secondary | ICD-10-CM | POA: Diagnosis not present

## 2019-08-20 DIAGNOSIS — N186 End stage renal disease: Secondary | ICD-10-CM | POA: Diagnosis not present

## 2019-08-20 DIAGNOSIS — D509 Iron deficiency anemia, unspecified: Secondary | ICD-10-CM | POA: Diagnosis not present

## 2019-08-20 DIAGNOSIS — Z992 Dependence on renal dialysis: Secondary | ICD-10-CM | POA: Diagnosis not present

## 2019-08-22 DIAGNOSIS — N186 End stage renal disease: Secondary | ICD-10-CM | POA: Diagnosis not present

## 2019-08-22 DIAGNOSIS — N2581 Secondary hyperparathyroidism of renal origin: Secondary | ICD-10-CM | POA: Diagnosis not present

## 2019-08-22 DIAGNOSIS — D509 Iron deficiency anemia, unspecified: Secondary | ICD-10-CM | POA: Diagnosis not present

## 2019-08-22 DIAGNOSIS — Z992 Dependence on renal dialysis: Secondary | ICD-10-CM | POA: Diagnosis not present

## 2019-08-22 DIAGNOSIS — D631 Anemia in chronic kidney disease: Secondary | ICD-10-CM | POA: Diagnosis not present

## 2019-08-25 DIAGNOSIS — Z1159 Encounter for screening for other viral diseases: Secondary | ICD-10-CM | POA: Diagnosis not present

## 2019-08-25 DIAGNOSIS — N2581 Secondary hyperparathyroidism of renal origin: Secondary | ICD-10-CM | POA: Diagnosis not present

## 2019-08-25 DIAGNOSIS — D509 Iron deficiency anemia, unspecified: Secondary | ICD-10-CM | POA: Diagnosis not present

## 2019-08-25 DIAGNOSIS — N186 End stage renal disease: Secondary | ICD-10-CM | POA: Diagnosis not present

## 2019-08-25 DIAGNOSIS — Z992 Dependence on renal dialysis: Secondary | ICD-10-CM | POA: Diagnosis not present

## 2019-08-25 DIAGNOSIS — D631 Anemia in chronic kidney disease: Secondary | ICD-10-CM | POA: Diagnosis not present

## 2019-08-27 DIAGNOSIS — N186 End stage renal disease: Secondary | ICD-10-CM | POA: Diagnosis not present

## 2019-08-27 DIAGNOSIS — D631 Anemia in chronic kidney disease: Secondary | ICD-10-CM | POA: Diagnosis not present

## 2019-08-27 DIAGNOSIS — D509 Iron deficiency anemia, unspecified: Secondary | ICD-10-CM | POA: Diagnosis not present

## 2019-08-27 DIAGNOSIS — N2581 Secondary hyperparathyroidism of renal origin: Secondary | ICD-10-CM | POA: Diagnosis not present

## 2019-08-27 DIAGNOSIS — Z992 Dependence on renal dialysis: Secondary | ICD-10-CM | POA: Diagnosis not present

## 2019-08-29 DIAGNOSIS — N186 End stage renal disease: Secondary | ICD-10-CM | POA: Diagnosis not present

## 2019-08-29 DIAGNOSIS — Z992 Dependence on renal dialysis: Secondary | ICD-10-CM | POA: Diagnosis not present

## 2019-08-29 DIAGNOSIS — D509 Iron deficiency anemia, unspecified: Secondary | ICD-10-CM | POA: Diagnosis not present

## 2019-08-29 DIAGNOSIS — D631 Anemia in chronic kidney disease: Secondary | ICD-10-CM | POA: Diagnosis not present

## 2019-08-29 DIAGNOSIS — N2581 Secondary hyperparathyroidism of renal origin: Secondary | ICD-10-CM | POA: Diagnosis not present

## 2019-09-01 DIAGNOSIS — Z992 Dependence on renal dialysis: Secondary | ICD-10-CM | POA: Diagnosis not present

## 2019-09-01 DIAGNOSIS — N2581 Secondary hyperparathyroidism of renal origin: Secondary | ICD-10-CM | POA: Diagnosis not present

## 2019-09-01 DIAGNOSIS — D631 Anemia in chronic kidney disease: Secondary | ICD-10-CM | POA: Diagnosis not present

## 2019-09-01 DIAGNOSIS — D509 Iron deficiency anemia, unspecified: Secondary | ICD-10-CM | POA: Diagnosis not present

## 2019-09-01 DIAGNOSIS — N186 End stage renal disease: Secondary | ICD-10-CM | POA: Diagnosis not present

## 2019-09-03 DIAGNOSIS — D509 Iron deficiency anemia, unspecified: Secondary | ICD-10-CM | POA: Diagnosis not present

## 2019-09-03 DIAGNOSIS — N2581 Secondary hyperparathyroidism of renal origin: Secondary | ICD-10-CM | POA: Diagnosis not present

## 2019-09-03 DIAGNOSIS — D631 Anemia in chronic kidney disease: Secondary | ICD-10-CM | POA: Diagnosis not present

## 2019-09-03 DIAGNOSIS — N186 End stage renal disease: Secondary | ICD-10-CM | POA: Diagnosis not present

## 2019-09-03 DIAGNOSIS — Z992 Dependence on renal dialysis: Secondary | ICD-10-CM | POA: Diagnosis not present

## 2019-09-05 DIAGNOSIS — D509 Iron deficiency anemia, unspecified: Secondary | ICD-10-CM | POA: Diagnosis not present

## 2019-09-05 DIAGNOSIS — Z992 Dependence on renal dialysis: Secondary | ICD-10-CM | POA: Diagnosis not present

## 2019-09-05 DIAGNOSIS — D631 Anemia in chronic kidney disease: Secondary | ICD-10-CM | POA: Diagnosis not present

## 2019-09-05 DIAGNOSIS — N186 End stage renal disease: Secondary | ICD-10-CM | POA: Diagnosis not present

## 2019-09-05 DIAGNOSIS — N2581 Secondary hyperparathyroidism of renal origin: Secondary | ICD-10-CM | POA: Diagnosis not present

## 2019-09-07 DIAGNOSIS — Z992 Dependence on renal dialysis: Secondary | ICD-10-CM | POA: Diagnosis not present

## 2019-09-07 DIAGNOSIS — N186 End stage renal disease: Secondary | ICD-10-CM | POA: Diagnosis not present

## 2019-09-08 DIAGNOSIS — N186 End stage renal disease: Secondary | ICD-10-CM | POA: Diagnosis not present

## 2019-09-08 DIAGNOSIS — N2581 Secondary hyperparathyroidism of renal origin: Secondary | ICD-10-CM | POA: Diagnosis not present

## 2019-09-08 DIAGNOSIS — Z992 Dependence on renal dialysis: Secondary | ICD-10-CM | POA: Diagnosis not present

## 2019-09-08 DIAGNOSIS — D509 Iron deficiency anemia, unspecified: Secondary | ICD-10-CM | POA: Diagnosis not present

## 2019-09-08 DIAGNOSIS — D631 Anemia in chronic kidney disease: Secondary | ICD-10-CM | POA: Diagnosis not present

## 2019-09-10 DIAGNOSIS — Z992 Dependence on renal dialysis: Secondary | ICD-10-CM | POA: Diagnosis not present

## 2019-09-10 DIAGNOSIS — D631 Anemia in chronic kidney disease: Secondary | ICD-10-CM | POA: Diagnosis not present

## 2019-09-10 DIAGNOSIS — N186 End stage renal disease: Secondary | ICD-10-CM | POA: Diagnosis not present

## 2019-09-10 DIAGNOSIS — D509 Iron deficiency anemia, unspecified: Secondary | ICD-10-CM | POA: Diagnosis not present

## 2019-09-10 DIAGNOSIS — N2581 Secondary hyperparathyroidism of renal origin: Secondary | ICD-10-CM | POA: Diagnosis not present

## 2019-09-18 ENCOUNTER — Encounter (HOSPITAL_COMMUNITY): Payer: Self-pay | Admitting: *Deleted

## 2019-09-18 ENCOUNTER — Other Ambulatory Visit: Payer: Self-pay

## 2019-09-18 ENCOUNTER — Emergency Department (HOSPITAL_COMMUNITY): Payer: Medicare Other

## 2019-09-18 ENCOUNTER — Emergency Department (HOSPITAL_COMMUNITY)
Admission: EM | Admit: 2019-09-18 | Discharge: 2019-09-18 | Disposition: A | Discharge status: Home / Self Care | Payer: Medicare Other | Attending: Emergency Medicine | Admitting: Emergency Medicine

## 2019-09-18 DIAGNOSIS — J449 Chronic obstructive pulmonary disease, unspecified: Secondary | ICD-10-CM | POA: Diagnosis not present

## 2019-09-18 DIAGNOSIS — I120 Hypertensive chronic kidney disease with stage 5 chronic kidney disease or end stage renal disease: Secondary | ICD-10-CM | POA: Diagnosis not present

## 2019-09-18 DIAGNOSIS — N186 End stage renal disease: Secondary | ICD-10-CM | POA: Insufficient documentation

## 2019-09-18 DIAGNOSIS — F17210 Nicotine dependence, cigarettes, uncomplicated: Secondary | ICD-10-CM | POA: Insufficient documentation

## 2019-09-18 DIAGNOSIS — Z992 Dependence on renal dialysis: Secondary | ICD-10-CM | POA: Diagnosis not present

## 2019-09-18 DIAGNOSIS — Z79899 Other long term (current) drug therapy: Secondary | ICD-10-CM | POA: Diagnosis not present

## 2019-09-18 DIAGNOSIS — R0602 Shortness of breath: Secondary | ICD-10-CM | POA: Diagnosis not present

## 2019-09-18 LAB — CBC WITH DIFFERENTIAL/PLATELET
Abs Immature Granulocytes: 0.02 10*3/uL (ref 0.00–0.07)
Basophils Absolute: 0 10*3/uL (ref 0.0–0.1)
Basophils Relative: 0 %
Eosinophils Absolute: 0 10*3/uL (ref 0.0–0.5)
Eosinophils Relative: 0 %
HCT: 34.4 % — ABNORMAL LOW (ref 39.0–52.0)
Hemoglobin: 11.1 g/dL — ABNORMAL LOW (ref 13.0–17.0)
Immature Granulocytes: 0 %
Lymphocytes Relative: 19 %
Lymphs Abs: 1.3 10*3/uL (ref 0.7–4.0)
MCH: 33.6 pg (ref 26.0–34.0)
MCHC: 32.3 g/dL (ref 30.0–36.0)
MCV: 104.2 fL — ABNORMAL HIGH (ref 80.0–100.0)
Monocytes Absolute: 0.5 10*3/uL (ref 0.1–1.0)
Monocytes Relative: 7 %
Neutro Abs: 4.9 10*3/uL (ref 1.7–7.7)
Neutrophils Relative %: 74 %
Platelets: 135 10*3/uL — ABNORMAL LOW (ref 150–400)
RBC: 3.3 MIL/uL — ABNORMAL LOW (ref 4.22–5.81)
RDW: 14 % (ref 11.5–15.5)
WBC: 6.8 10*3/uL (ref 4.0–10.5)
nRBC: 0 % (ref 0.0–0.2)

## 2019-09-18 LAB — BASIC METABOLIC PANEL
Anion gap: 18 — ABNORMAL HIGH (ref 5–15)
BUN: 95 mg/dL — ABNORMAL HIGH (ref 6–20)
CO2: 12 mmol/L — ABNORMAL LOW (ref 22–32)
Calcium: 7.6 mg/dL — ABNORMAL LOW (ref 8.9–10.3)
Chloride: 105 mmol/L (ref 98–111)
Creatinine, Ser: 19.73 mg/dL — ABNORMAL HIGH (ref 0.61–1.24)
GFR calc Af Amer: 3 mL/min — ABNORMAL LOW (ref >60)
GFR calc non Af Amer: 2 mL/min — ABNORMAL LOW (ref >60)
Glucose, Bld: 101 mg/dL — ABNORMAL HIGH (ref 70–99)
Potassium: 4.9 mmol/L (ref 3.5–5.1)
Sodium: 135 mmol/L (ref 135–145)
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
## 2019-09-18 NOTE — Discharge Instructions (Addendum)
Your labs are ok to go to dialysis.  I did try to call [facility] on [address] however they were already closed.  I am sending you with a copy of your lab reports from today.  Please call the facility first thing in the morning to make sure you get on the schedule for dialysis.  You may return to the emergency department if he have any problems.

## 2019-09-18 NOTE — ED Triage Notes (Signed)
Pt gets HD on M-W-F but missed past 3 times of HD.  Sent here for HD.  Pt states he has to take care of his mother and has had diarrhea recently.  Was told that his hemoglobin was abnormal.

## 2019-09-21 ENCOUNTER — Emergency Department (HOSPITAL_COMMUNITY): Payer: Medicare Other

## 2019-09-21 ENCOUNTER — Other Ambulatory Visit: Payer: Self-pay

## 2019-09-21 ENCOUNTER — Encounter (HOSPITAL_COMMUNITY): Payer: Self-pay | Admitting: Emergency Medicine

## 2019-09-21 ENCOUNTER — Inpatient Hospital Stay (HOSPITAL_COMMUNITY)
Admission: EM | Admit: 2019-09-21 | Discharge: 2019-09-23 | DRG: 682 | Disposition: A | Discharge status: Home / Self Care | Payer: Medicare Other | Attending: Family Medicine | Admitting: Family Medicine

## 2019-09-21 DIAGNOSIS — N186 End stage renal disease: Secondary | ICD-10-CM | POA: Diagnosis present

## 2019-09-21 DIAGNOSIS — R7989 Other specified abnormal findings of blood chemistry: Secondary | ICD-10-CM | POA: Diagnosis present

## 2019-09-21 DIAGNOSIS — N19 Unspecified kidney failure: Secondary | ICD-10-CM | POA: Diagnosis not present

## 2019-09-21 DIAGNOSIS — I739 Peripheral vascular disease, unspecified: Secondary | ICD-10-CM | POA: Diagnosis present

## 2019-09-21 DIAGNOSIS — I1311 Hypertensive heart and chronic kidney disease without heart failure, with stage 5 chronic kidney disease, or end stage renal disease: Principal | ICD-10-CM | POA: Diagnosis present

## 2019-09-21 DIAGNOSIS — D631 Anemia in chronic kidney disease: Secondary | ICD-10-CM | POA: Diagnosis not present

## 2019-09-21 DIAGNOSIS — N2581 Secondary hyperparathyroidism of renal origin: Secondary | ICD-10-CM | POA: Diagnosis present

## 2019-09-21 DIAGNOSIS — F17210 Nicotine dependence, cigarettes, uncomplicated: Secondary | ICD-10-CM | POA: Diagnosis present

## 2019-09-21 DIAGNOSIS — Z992 Dependence on renal dialysis: Secondary | ICD-10-CM

## 2019-09-21 DIAGNOSIS — Z7951 Long term (current) use of inhaled steroids: Secondary | ICD-10-CM

## 2019-09-21 DIAGNOSIS — J449 Chronic obstructive pulmonary disease, unspecified: Secondary | ICD-10-CM | POA: Diagnosis present

## 2019-09-21 DIAGNOSIS — E8889 Other specified metabolic disorders: Secondary | ICD-10-CM | POA: Diagnosis not present

## 2019-09-21 DIAGNOSIS — R1115 Cyclical vomiting syndrome unrelated to migraine: Secondary | ICD-10-CM

## 2019-09-21 DIAGNOSIS — Z79899 Other long term (current) drug therapy: Secondary | ICD-10-CM

## 2019-09-21 DIAGNOSIS — R5383 Other fatigue: Secondary | ICD-10-CM | POA: Diagnosis not present

## 2019-09-21 DIAGNOSIS — Z20822 Contact with and (suspected) exposure to covid-19: Secondary | ICD-10-CM | POA: Diagnosis not present

## 2019-09-21 DIAGNOSIS — R531 Weakness: Secondary | ICD-10-CM

## 2019-09-21 DIAGNOSIS — Z800 Family history of malignant neoplasm of digestive organs: Secondary | ICD-10-CM

## 2019-09-21 DIAGNOSIS — Z791 Long term (current) use of non-steroidal anti-inflammatories (NSAID): Secondary | ICD-10-CM

## 2019-09-21 DIAGNOSIS — I517 Cardiomegaly: Secondary | ICD-10-CM | POA: Diagnosis not present

## 2019-09-21 DIAGNOSIS — Z8249 Family history of ischemic heart disease and other diseases of the circulatory system: Secondary | ICD-10-CM

## 2019-09-21 DIAGNOSIS — K219 Gastro-esophageal reflux disease without esophagitis: Secondary | ICD-10-CM | POA: Diagnosis present

## 2019-09-21 DIAGNOSIS — Z825 Family history of asthma and other chronic lower respiratory diseases: Secondary | ICD-10-CM

## 2019-09-21 DIAGNOSIS — I10 Essential (primary) hypertension: Secondary | ICD-10-CM | POA: Diagnosis present

## 2019-09-21 DIAGNOSIS — Z720 Tobacco use: Secondary | ICD-10-CM | POA: Diagnosis present

## 2019-09-21 DIAGNOSIS — R197 Diarrhea, unspecified: Secondary | ICD-10-CM | POA: Diagnosis present

## 2019-09-21 DIAGNOSIS — Z9115 Patient's noncompliance with renal dialysis: Secondary | ICD-10-CM

## 2019-09-21 LAB — CBC
HCT: 35.6 % — ABNORMAL LOW (ref 39.0–52.0)
Hemoglobin: 11.7 g/dL — ABNORMAL LOW (ref 13.0–17.0)
MCH: 34 pg (ref 26.0–34.0)
MCHC: 32.9 g/dL (ref 30.0–36.0)
MCV: 103.5 fL — ABNORMAL HIGH (ref 80.0–100.0)
Platelets: 147 10*3/uL — ABNORMAL LOW (ref 150–400)
RBC: 3.44 MIL/uL — ABNORMAL LOW (ref 4.22–5.81)
RDW: 14 % (ref 11.5–15.5)
WBC: 5.2 10*3/uL (ref 4.0–10.5)
nRBC: 0 % (ref 0.0–0.2)

## 2019-09-21 LAB — COMPREHENSIVE METABOLIC PANEL
ALT: 11 U/L (ref 0–44)
AST: 15 U/L (ref 15–41)
Albumin: 3.3 g/dL — ABNORMAL LOW (ref 3.5–5.0)
Alkaline Phosphatase: 88 U/L (ref 38–126)
Anion gap: 17 — ABNORMAL HIGH (ref 5–15)
BUN: 125 mg/dL — ABNORMAL HIGH (ref 6–20)
CO2: 12 mmol/L — ABNORMAL LOW (ref 22–32)
Calcium: 7.5 mg/dL — ABNORMAL LOW (ref 8.9–10.3)
Chloride: 103 mmol/L (ref 98–111)
Creatinine, Ser: 22.9 mg/dL — ABNORMAL HIGH (ref 0.61–1.24)
GFR calc Af Amer: 2 mL/min — ABNORMAL LOW (ref >60)
GFR calc non Af Amer: 2 mL/min — ABNORMAL LOW (ref >60)
Glucose, Bld: 152 mg/dL — ABNORMAL HIGH (ref 70–99)
Potassium: 4.4 mmol/L (ref 3.5–5.1)
Sodium: 132 mmol/L — ABNORMAL LOW (ref 135–145)
Total Bilirubin: 0.8 mg/dL (ref 0.3–1.2)
Total Protein: 6.6 g/dL (ref 6.5–8.1)

## 2019-09-21 LAB — I-STAT CHEM 8, ED
BUN: 121 mg/dL — ABNORMAL HIGH (ref 6–20)
Calcium, Ion: 0.97 mmol/L — ABNORMAL LOW (ref 1.15–1.40)
Chloride: 108 mmol/L (ref 98–111)
Creatinine, Ser: >18 mg/dL — ABNORMAL HIGH (ref 0.61–1.24)
Glucose, Bld: 141 mg/dL — ABNORMAL HIGH (ref 70–99)
HCT: 38 % — ABNORMAL LOW (ref 39.0–52.0)
Hemoglobin: 12.9 g/dL — ABNORMAL LOW (ref 13.0–17.0)
Potassium: 4.4 mmol/L (ref 3.5–5.1)
Sodium: 132 mmol/L — ABNORMAL LOW (ref 135–145)
TCO2: 13 mmol/L — ABNORMAL LOW (ref 22–32)

## 2019-09-21 MED ORDER — ACETAMINOPHEN 325 MG PO TABS
650.0000 mg | ORAL_TABLET | Freq: Four times a day (QID) | ORAL | Status: DC | PRN
  Administered 2019-09-23: 650 mg via ORAL
  Filled 2019-09-21: qty 2

## 2019-09-21 MED ORDER — ONDANSETRON HCL 4 MG/2ML IJ SOLN
4.0000 mg | Freq: Four times a day (QID) | INTRAMUSCULAR | Status: DC | PRN
  Administered 2019-09-22 (×2): 4 mg via INTRAVENOUS
  Filled 2019-09-21 (×3): qty 2

## 2019-09-21 MED ORDER — ACETAMINOPHEN 650 MG RE SUPP: 650.0000 mg | Freq: Four times a day (QID) | RECTAL | Status: DC | PRN

## 2019-09-21 MED ORDER — CHLORHEXIDINE GLUCONATE CLOTH 2 % EX PADS
6.0000 | MEDICATED_PAD | Freq: Every day | CUTANEOUS | Status: DC
  Administered 2019-09-22 – 2019-09-23 (×2): 6 via TOPICAL

## 2019-09-21 MED ORDER — HEPARIN SODIUM (PORCINE) 5000 UNIT/ML IJ SOLN
5000.0000 [IU] | Freq: Three times a day (TID) | INTRAMUSCULAR | Status: DC
  Administered 2019-09-21 – 2019-09-22 (×4): 5000 [IU] via SUBCUTANEOUS
  Filled 2019-09-21 (×5): qty 1

## 2019-09-21 MED ORDER — ONDANSETRON HCL 4 MG PO TABS: 4.0000 mg | ORAL_TABLET | Freq: Four times a day (QID) | ORAL | Status: DC | PRN

## 2019-09-21 NOTE — ED Triage Notes (Signed)
Patient reports loss of appetite. Patient is a dialysis patient seen here 09/18/19. Patient has not had dialysis since 09/10/19. Patient states he was sent here for dialysis on 09/18/19.
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
## 2019-09-22 ENCOUNTER — Inpatient Hospital Stay (HOSPITAL_COMMUNITY): Payer: Medicare Other

## 2019-09-22 DIAGNOSIS — I120 Hypertensive chronic kidney disease with stage 5 chronic kidney disease or end stage renal disease: Secondary | ICD-10-CM | POA: Diagnosis not present

## 2019-09-22 DIAGNOSIS — Z79899 Other long term (current) drug therapy: Secondary | ICD-10-CM | POA: Diagnosis not present

## 2019-09-22 DIAGNOSIS — D631 Anemia in chronic kidney disease: Secondary | ICD-10-CM | POA: Diagnosis present

## 2019-09-22 DIAGNOSIS — R1110 Vomiting, unspecified: Secondary | ICD-10-CM | POA: Diagnosis not present

## 2019-09-22 DIAGNOSIS — E8889 Other specified metabolic disorders: Secondary | ICD-10-CM | POA: Diagnosis present

## 2019-09-22 DIAGNOSIS — Z800 Family history of malignant neoplasm of digestive organs: Secondary | ICD-10-CM | POA: Diagnosis not present

## 2019-09-22 DIAGNOSIS — R197 Diarrhea, unspecified: Secondary | ICD-10-CM | POA: Diagnosis present

## 2019-09-22 DIAGNOSIS — N186 End stage renal disease: Secondary | ICD-10-CM | POA: Diagnosis present

## 2019-09-22 DIAGNOSIS — N19 Unspecified kidney failure: Secondary | ICD-10-CM | POA: Diagnosis not present

## 2019-09-22 DIAGNOSIS — N250 Renal osteodystrophy: Secondary | ICD-10-CM | POA: Diagnosis not present

## 2019-09-22 DIAGNOSIS — K219 Gastro-esophageal reflux disease without esophagitis: Secondary | ICD-10-CM | POA: Diagnosis present

## 2019-09-22 DIAGNOSIS — I1311 Hypertensive heart and chronic kidney disease without heart failure, with stage 5 chronic kidney disease, or end stage renal disease: Secondary | ICD-10-CM | POA: Diagnosis present

## 2019-09-22 DIAGNOSIS — I739 Peripheral vascular disease, unspecified: Secondary | ICD-10-CM | POA: Diagnosis present

## 2019-09-22 DIAGNOSIS — Z7951 Long term (current) use of inhaled steroids: Secondary | ICD-10-CM | POA: Diagnosis not present

## 2019-09-22 DIAGNOSIS — F17210 Nicotine dependence, cigarettes, uncomplicated: Secondary | ICD-10-CM | POA: Diagnosis present

## 2019-09-22 DIAGNOSIS — N2581 Secondary hyperparathyroidism of renal origin: Secondary | ICD-10-CM | POA: Diagnosis present

## 2019-09-22 DIAGNOSIS — Z992 Dependence on renal dialysis: Secondary | ICD-10-CM | POA: Diagnosis not present

## 2019-09-22 DIAGNOSIS — Z9115 Patient's noncompliance with renal dialysis: Secondary | ICD-10-CM | POA: Diagnosis not present

## 2019-09-22 DIAGNOSIS — J449 Chronic obstructive pulmonary disease, unspecified: Secondary | ICD-10-CM | POA: Diagnosis present

## 2019-09-22 DIAGNOSIS — Z825 Family history of asthma and other chronic lower respiratory diseases: Secondary | ICD-10-CM | POA: Diagnosis not present

## 2019-09-22 DIAGNOSIS — R7989 Other specified abnormal findings of blood chemistry: Secondary | ICD-10-CM | POA: Diagnosis present

## 2019-09-22 DIAGNOSIS — Z791 Long term (current) use of non-steroidal anti-inflammatories (NSAID): Secondary | ICD-10-CM | POA: Diagnosis not present

## 2019-09-22 DIAGNOSIS — Z8249 Family history of ischemic heart disease and other diseases of the circulatory system: Secondary | ICD-10-CM | POA: Diagnosis not present

## 2019-09-22 DIAGNOSIS — Z20822 Contact with and (suspected) exposure to covid-19: Secondary | ICD-10-CM | POA: Diagnosis present

## 2019-09-22 LAB — BASIC METABOLIC PANEL
BUN: 118 mg/dL — ABNORMAL HIGH (ref 6–20)
CO2: 11 mmol/L — ABNORMAL LOW (ref 22–32)
Calcium: 7.5 mg/dL — ABNORMAL LOW (ref 8.9–10.3)
Chloride: 103 mmol/L (ref 98–111)
Creatinine, Ser: 24.11 mg/dL — ABNORMAL HIGH (ref 0.61–1.24)
GFR calc Af Amer: 2 mL/min — ABNORMAL LOW (ref >60)
GFR calc non Af Amer: 2 mL/min — ABNORMAL LOW (ref >60)
Glucose, Bld: 86 mg/dL (ref 70–99)
Potassium: 4.4 mmol/L (ref 3.5–5.1)
Sodium: 135 mmol/L (ref 135–145)

## 2019-09-22 LAB — CBC
HCT: 35.4 % — ABNORMAL LOW (ref 39.0–52.0)
Hemoglobin: 11.5 g/dL — ABNORMAL LOW (ref 13.0–17.0)
MCH: 33.3 pg (ref 26.0–34.0)
MCHC: 32.5 g/dL (ref 30.0–36.0)
MCV: 102.6 fL — ABNORMAL HIGH (ref 80.0–100.0)
Platelets: 139 10*3/uL — ABNORMAL LOW (ref 150–400)
RBC: 3.45 MIL/uL — ABNORMAL LOW (ref 4.22–5.81)
RDW: 14 % (ref 11.5–15.5)
WBC: 6.6 10*3/uL (ref 4.0–10.5)
nRBC: 0 % (ref 0.0–0.2)

## 2019-09-22 LAB — HIV ANTIBODY (ROUTINE TESTING W REFLEX): HIV Screen 4th Generation wRfx: NONREACTIVE

## 2019-09-22 LAB — RESPIRATORY PANEL BY RT PCR (FLU A&B, COVID)
Influenza A by PCR: NEGATIVE
Influenza B by PCR: NEGATIVE
SARS Coronavirus 2 by RT PCR: NEGATIVE

## 2019-09-22 LAB — SARS CORONAVIRUS 2 (TAT 6-24 HRS): SARS Coronavirus 2: NEGATIVE

## 2019-09-22 LAB — LIPASE, BLOOD: Lipase: 50 U/L (ref 11–51)

## 2019-09-22 LAB — MRSA PCR SCREENING: MRSA by PCR: NEGATIVE

## 2019-09-22 LAB — HEPATITIS B SURFACE ANTIGEN: Hepatitis B Surface Ag: NONREACTIVE

## 2019-09-22 MED ORDER — CILOSTAZOL 100 MG PO TABS
100.0000 mg | ORAL_TABLET | Freq: Two times a day (BID) | ORAL | Status: DC
  Administered 2019-09-22 – 2019-09-23 (×3): 100 mg via ORAL
  Filled 2019-09-22 (×3): qty 1

## 2019-09-22 MED ORDER — SEVELAMER CARBONATE 800 MG PO TABS
800.0000 mg | ORAL_TABLET | Freq: Three times a day (TID) | ORAL | Status: DC
  Administered 2019-09-23 (×2): 800 mg via ORAL
  Filled 2019-09-22 (×3): qty 1

## 2019-09-22 MED ORDER — CYCLOSPORINE 0.05 % OP EMUL
1.0000 [drp] | Freq: Two times a day (BID) | OPHTHALMIC | Status: DC
  Administered 2019-09-22 – 2019-09-23 (×3): 1 [drp] via OPHTHALMIC
  Filled 2019-09-22 (×3): qty 1

## 2019-09-22 MED ORDER — RENA-VITE PO TABS
1.0000 | ORAL_TABLET | Freq: Every day | ORAL | Status: DC
  Administered 2019-09-23: 1 via ORAL
  Filled 2019-09-22: qty 1

## 2019-09-22 MED ORDER — CARVEDILOL 3.125 MG PO TABS
3.1250 mg | ORAL_TABLET | Freq: Two times a day (BID) | ORAL | Status: DC
  Administered 2019-09-23: 3.125 mg via ORAL
  Filled 2019-09-22 (×2): qty 1

## 2019-09-22 MED ORDER — HYDRALAZINE HCL 10 MG PO TABS
10.0000 mg | ORAL_TABLET | Freq: Three times a day (TID) | ORAL | Status: AC | PRN
  Administered 2019-09-22: 10 mg via ORAL
  Filled 2019-09-22: qty 1

## 2019-09-22 MED ORDER — ALBUTEROL SULFATE (2.5 MG/3ML) 0.083% IN NEBU: 3.0000 mL | INHALATION_SOLUTION | Freq: Four times a day (QID) | RESPIRATORY_TRACT | Status: DC | PRN

## 2019-09-22 MED ORDER — MOMETASONE FURO-FORMOTEROL FUM 200-5 MCG/ACT IN AERO
2.0000 | INHALATION_SPRAY | Freq: Two times a day (BID) | RESPIRATORY_TRACT | Status: DC
  Administered 2019-09-23: 2 via RESPIRATORY_TRACT
  Filled 2019-09-22: qty 8.8

## 2019-09-22 MED ORDER — CYCLOBENZAPRINE HCL 10 MG PO TABS
10.0000 mg | ORAL_TABLET | Freq: Two times a day (BID) | ORAL | Status: DC
  Administered 2019-09-22 – 2019-09-23 (×3): 10 mg via ORAL
  Filled 2019-09-22 (×3): qty 1

## 2019-09-22 MED ORDER — NEPRO/CARBSTEADY PO LIQD
237.0000 mL | Freq: Two times a day (BID) | ORAL | Status: DC
  Administered 2019-09-23: 237 mL via ORAL

## 2019-09-22 MED ORDER — PROMETHAZINE HCL 25 MG RE SUPP
25.0000 mg | Freq: Once | RECTAL | Status: AC
  Administered 2019-09-22: 25 mg via RECTAL
  Filled 2019-09-22: qty 1

## 2019-09-22 NOTE — Progress Notes (Signed)
Patient complains of nausea and repeated episodes of vomiting. Patient received a dose of Zofran at 1318. MD has been in room to assess patient and received verbal order to administer a second dose of odansetron (Zofran) injection 4 mg to patient. Medication administered per order. Will continue to monitor patient.
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
## 2019-09-22 NOTE — Progress Notes (Signed)
  Patient seen and evaluated, chart reviewed, please see EMR for updated orders. Please see full H&P dictated by admitting physician Dr Humphrey Rolls for same date of service.   Brief Summary:- 59 y.o. male with a PMH signficant for HTN, PVD, COPD, and ESRD admitted on 09/22/2019 with uremia with generalized weakness, poor appetite and loose stools after multiple missed HD sessions   A/p 1)ESRD--now presenting with uremia after multiple missed HD sessions - last HD session on 09/10/2019 -Discussed with Dr. Marval Regal from nephrology service -Anticipate serial/back-to-back hemodialysis sessions  2)HTN--okay to hold amlodipine, hold hydralazine, hold torsemide, resume Coreg at a lower dose of 3.125 mg twice daily  3)COPD--no acute exacerbation, continue bronchodilators  4) tobacco abuse--- cessation advised, use nicotine patch  5)PAD-continue Pletal  Patient seen and evaluated, chart reviewed, please see EMR for updated orders. Please see full H&P dictated by admitting physician Dr Humphrey Rolls for same date of service

## 2019-09-22 NOTE — Progress Notes (Signed)
Initial Nutrition Assessment  DOCUMENTATION CODES:      INTERVENTION:  Nepro Shake po BID, each supplement provides 425 kcal and 19 grams protein    NUTRITION DIAGNOSIS:   Increased nutrient needs related to chronic illness(ESRD-HD) as evidenced by estimated needs.   GOAL:  Patient will meet greater than or equal to 90% of their needs   MONITOR:  PO intake, Supplement acceptance, I & O's, Labs, Weight trends    REASON FOR ASSESSMENT:   Malnutrition Screening Tool    ASSESSMENT: patient is an underweight 59 yo male with ESRD-HD, COPD, GERD and HTN. He presents with uremia, loose stools and poor appetite prior to admission. He missed 3 HD treatments.  Lives with his mom and assist with her care. Patient prepares meal for the household.  Today his lunch tray observed -50% of meal consumed. He is complaining of being thirsty.     Medications reviewed. Labs: BMP Latest Ref Rng & Units 09/22/2019 09/21/2019 09/21/2019  Glucose 70 - 99 mg/dL 86 141(H) 152(H)  BUN 6 - 20 mg/dL 118(H) 121(H) 125(H)  Creatinine 0.61 - 1.24 mg/dL 24.11(H) >18.00(H) 22.90(H)  Sodium 135 - 145 mmol/L 135 132(L) 132(L)  Potassium 3.5 - 5.1 mmol/L 4.4 4.4 4.4  Chloride 98 - 111 mmol/L 103 108 103  CO2 22 - 32 mmol/L 11(L) - 12(L)  Calcium 8.9 - 10.3 mg/dL 7.5(L) - 7.5(L)    Nutrition-Focused physical exam findings are moderate orbital and buccal fat depletion, moderate temporal muscle depletion, and no edema. Patient complaining of increased weakness.   Weight has been ranging between 54-58 kg the past 2+ years.  Diet Order:   Diet Order            Diet renal with fluid restriction Fluid restriction: 1200 mL Fluid; Room service appropriate? Yes; Fluid consistency: Thin  Diet effective now              EDUCATION NEEDS:  Education needs have been addressed Skin:  Skin Assessment: Reviewed RN Assessment- no further loose stools reported since admission.  Last BM:  3/14  Height:   Ht  Readings from Last 1 Encounters:  09/21/19 5\' 10"  (1.778 m)    Weight:   Wt Readings from Last 1 Encounters:  09/22/19 53.9 kg    Ideal Body Weight:   75kg  BMI:  Body mass index is 17.05 kg/m.  Estimated Nutritional Needs:   Kcal:  1890-2160  Protein:  75-85 gr  Fluid:  1200 ml  Colman Cater MS,RD,CSG,LDN Office: [phone] Pager: # available in [facility]
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
## 2019-09-22 NOTE — Progress Notes (Signed)
MEWS score change from green to yellow due to low heart rate.  MD and charge nurse notified of change. Will continue to follow protocol and monitor. Elodia Florence RN    MEWS Guidelines - (patients age 58 and over)  Red - At High Risk for Deterioration Yellow - At risk for Deterioration  1. Go to room and assess patient 2. Validate data. Is this patient's baseline? If data confirmed: 3. Is this an acute change? 4. Administer prn meds/treatments as ordered. 5. Note Sepsis score 6. Review goals of care 7. Sports coach, RRT nurse and Provider. 8. Ask Provider to come to bedside.  9. Document patient condition/interventions/response. 10. Increase frequency of vital signs and focused assessments to at least q15 minutes x 4, then q30 minutes x2. - If stable, then q1h x3, then q4h x3 and then q8h or dept. routine. - If unstable, contact Provider & RRT nurse. Prepare for possible transfer. 11. Add entry in progress notes using the smart phrase ".MEWS". 1. Go to room and assess patient 2. Validate data. Is this patient's baseline? If data confirmed: 3. Is this an acute change? 4. Administer prn meds/treatments as ordered? 5. Note Sepsis score 6. Review goals of care 7. Sports coach and Provider 8. Call RRT nurse as needed. 9. Document patient condition/interventions/response. 10. Increase frequency of vital signs and focused assessments to at least q2h x2. - If stable, then q4h x2 and then q8h or dept. routine. - If unstable, contact Provider & RRT nurse. Prepare for possible transfer. 11. Add entry in progress notes using the smart phrase ".MEWS".  Green - Likely stable [place] - Comfort Care Only  1. Continue routine/ordered monitoring.  2. Review goals of care. 1. Continue routine/ordered monitoring. 2. Review goals of care.

## 2019-09-23 LAB — CBC
HCT: 30.7 % — ABNORMAL LOW (ref 39.0–52.0)
Hemoglobin: 10 g/dL — ABNORMAL LOW (ref 13.0–17.0)
MCH: 33 pg (ref 26.0–34.0)
MCHC: 32.6 g/dL (ref 30.0–36.0)
MCV: 101.3 fL — ABNORMAL HIGH (ref 80.0–100.0)
Platelets: 118 10*3/uL — ABNORMAL LOW (ref 150–400)
RBC: 3.03 MIL/uL — ABNORMAL LOW (ref 4.22–5.81)
RDW: 13.6 % (ref 11.5–15.5)
WBC: 7.1 10*3/uL (ref 4.0–10.5)
nRBC: 0 % (ref 0.0–0.2)

## 2019-09-23 LAB — RENAL FUNCTION PANEL
Albumin: 2.6 g/dL — ABNORMAL LOW (ref 3.5–5.0)
Anion gap: 13 (ref 5–15)
BUN: 50 mg/dL — ABNORMAL HIGH (ref 6–20)
CO2: 25 mmol/L (ref 22–32)
Calcium: 7.2 mg/dL — ABNORMAL LOW (ref 8.9–10.3)
Chloride: 97 mmol/L — ABNORMAL LOW (ref 98–111)
Creatinine, Ser: 13.28 mg/dL — ABNORMAL HIGH (ref 0.61–1.24)
GFR calc Af Amer: 4 mL/min — ABNORMAL LOW (ref >60)
GFR calc non Af Amer: 4 mL/min — ABNORMAL LOW (ref >60)
Glucose, Bld: 85 mg/dL (ref 70–99)
Phosphorus: 6.2 mg/dL — ABNORMAL HIGH (ref 2.5–4.6)
Potassium: 3.5 mmol/L (ref 3.5–5.1)
Sodium: 135 mmol/L (ref 135–145)

## 2019-09-23 MED ORDER — ALBUTEROL SULFATE HFA 108 (90 BASE) MCG/ACT IN AERS: 2.0000 | INHALATION_SPRAY | Freq: Four times a day (QID) | RESPIRATORY_TRACT | 2 refills | Status: AC | PRN

## 2019-09-23 MED ORDER — ONDANSETRON 4 MG PO TBDP: 4.0000 mg | ORAL_TABLET | Freq: Three times a day (TID) | ORAL | 0 refills | Status: AC | PRN

## 2019-09-23 MED ORDER — BUDESONIDE-FORMOTEROL FUMARATE 160-4.5 MCG/ACT IN AERO: 2.0000 | INHALATION_SPRAY | Freq: Two times a day (BID) | RESPIRATORY_TRACT | 12 refills | Status: DC

## 2019-09-23 MED ORDER — LOPERAMIDE HCL 2 MG PO TABS: 2.0000 mg | ORAL_TABLET | Freq: Four times a day (QID) | ORAL | 0 refills | Status: AC | PRN

## 2019-09-23 MED ORDER — DOXERCALCIFEROL 0.5 MCG PO CAPS
1.5000 ug | ORAL_CAPSULE | ORAL | Status: DC
  Filled 2019-09-23: qty 3

## 2019-09-23 MED ORDER — CILOSTAZOL 100 MG PO TABS: 100.0000 mg | ORAL_TABLET | Freq: Two times a day (BID) | ORAL | 3 refills | Status: AC

## 2019-09-23 MED ORDER — AMLODIPINE BESYLATE 5 MG PO TABS: 5.0000 mg | ORAL_TABLET | Freq: Every day | ORAL | 2 refills | Status: DC

## 2019-09-23 MED ORDER — ACETAMINOPHEN 325 MG PO TABS: 650.0000 mg | ORAL_TABLET | Freq: Four times a day (QID) | ORAL | 0 refills | Status: AC | PRN

## 2019-09-23 MED ORDER — DOXERCALCIFEROL 4 MCG/2ML IV SOLN: 1.0000 ug | INTRAVENOUS | Status: DC

## 2019-09-23 MED ORDER — AMLODIPINE BESYLATE 5 MG PO TABS: 5.0000 mg | ORAL_TABLET | Freq: Every day | ORAL | Status: DC

## 2019-09-23 MED ORDER — HYDRALAZINE HCL 25 MG PO TABS
50.0000 mg | ORAL_TABLET | Freq: Three times a day (TID) | ORAL | Status: DC
  Administered 2019-09-23 (×2): 50 mg via ORAL
  Filled 2019-09-23 (×2): qty 2

## 2019-09-23 MED ORDER — CARVEDILOL 6.25 MG PO TABS: 6.2500 mg | ORAL_TABLET | Freq: Two times a day (BID) | ORAL | 3 refills | Status: DC

## 2019-09-23 MED ORDER — HYDRALAZINE HCL 50 MG PO TABS: 50.0000 mg | ORAL_TABLET | Freq: Three times a day (TID) | ORAL | 4 refills | Status: DC

## 2019-09-23 NOTE — Progress Notes (Signed)
BP 185/79, Heart rate 88. MD notified. New orders placed for PO hydralazine.

## 2019-09-23 NOTE — Progress Notes (Signed)
IV removed and dc instructions reviewed with scripts sent to pharmacy.  Drove himself to hospital and taken by wc to door.

## 2019-09-23 NOTE — Progress Notes (Signed)
[facility] Progress Note  Name: Luke Mueller MRN: [id number] DOB: [birth year]  Chief Complaint:  Weakness   Subjective:  S/p HD on 3/15 with BF 300 and DF 600 and 700 mL net UF.  He declines an extra dialysis treatment today.  Has been 100% on 2 liters and states breathing is better.  Wants to go home and I discussed with him that he would need to speak with his primary team.   Review of systems:  Shortness of breath is better Diarrhea is better Nausea yesterday - none today   ------------ Background on consult:   Erin GUNNER IODICE is a 59 y.o. male with a PMH signficant for HTN, PVD, COPD, and ESRD who presented to [facility] ED with generalized weakness, diarrhea, and poor po intake.  His last HD was 09/10/19 and had been sent by his HD unit to the ED on 09/18/19 due to multiple missed HD sessions, however he was sent home 09/18/19 as his exam and labs were stable.  He presented again to the ED and was admitted yesterday with significant azotemia.  We were consulted to provide HD while he remains an inpatient.   Intake/Output Summary (Last 24 hours) at 09/23/2019 0848 Last data filed at 09/23/2019 0500 Gross per 24 hour  Intake 720 ml  Output 700 ml  Net 20 ml    Vitals:  Vitals:   09/22/19 2316 09/22/19 2345 09/23/19 0112 09/23/19 0535  BP: (!) 184/78  (!) 180/85 (!) 162/85  Pulse:  100 92 96  Resp:   16 20  Temp:   99.9 F (37.7 C) 99.6 F (37.6 C)  TempSrc:   Oral Oral  SpO2:   99% 100%  Weight:    53.1 kg  Height:         Physical Exam:  General adult male in bed in no acute distress HEENT normocephalic atraumatic extraocular movements intact sclera anicteric Neck supple trachea midline Lungs clear to auscultation bilaterally normal work of breathing at rest  Heart regular rate and rhythm no rubs or gallops appreciated Abdomen soft nontender nondistended Extremities no edema  Psych normal mood and affect RUE AVF with bruit and thrill    Medications reviewed    Labs:  BMP Latest Ref Rng & Units 09/23/2019 09/22/2019 09/21/2019  Glucose 70 - 99 mg/dL 85 86 141(H)  BUN 6 - 20 mg/dL 50(H) 118(H) 121(H)  Creatinine 0.61 - 1.24 mg/dL 13.28(H) 24.11(H) >18.00(H)  Sodium 135 - 145 mmol/L 135 135 132(L)  Potassium 3.5 - 5.1 mmol/L 3.5 4.4 4.4  Chloride 98 - 111 mmol/L 97(L) 103 108  CO2 22 - 32 mmol/L 25 11(L) -  Calcium 8.9 - 10.3 mg/dL 7.2(L) 7.5(L) -   HD orders:  Center: [facility]  on MWF . EDW 52kg HD Bath 2K/2.5Ca  Time 180 min Heparin 1000. Access RAVF BFR 400 DFR 800    Hectoral 1.5 mcg IV/HD Epogen none  Assessment/Plan:   1.  Abdominal pain with diarrhea- workup per primary 2.  ESRD -  Normally MWF schedule - hx noncompliance.  s/p HD on 3/15.  No acute indication for HD.  Next HD will be on 3/17 - inpatient vs outpatient  3.  Hypertension/volume  - restart hydralazine 50 mg TID and restart amlodipine at 5 mg daily (reduced from home dose for now).  Note hydralazine and amlodipine were not yet ordered.  Hx bradycardia - defer uptitration of coreg 4.  Anemia  -  stable; not on  epogen outpatient  5.  Metabolic bone disease/Secondary Hyperparathyroidism -  Cont with outpatient meds - ordered hectoral and on renvela. 6. Nutrition - renal diet and on nepro  7. Generalized weakness- possibly due to uremia.  follow exam with HD; better per patient   Disposition per primary team.    Claudia Desanctis, MD 09/23/2019 9:08 AM
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
## 2019-09-23 NOTE — Discharge Instructions (Signed)
1)Very low-salt diet advised 2)Weigh yourself daily, call if you gain more than 3 pounds in 1 day or more than 5 pounds in 1 week as your diuretic medications and Hemodialysis schedule may need to be adjusted 3)Limit your Fluid  intake to no more than 60 ounces (1.8 Liters) per day 4)Keep your Hemodialysis appointment for Mondays Wednesdays and Fridays-----your next hemodialysis session is Wednesday, 09/24/2019 5)You are strongly advised to quit smoking  6)Avoid ibuprofen/Advil/Aleve/Motrin/Goody Powders/Naproxen/BC powders/Meloxicam/Diclofenac/Indomethacin and other Nonsteroidal anti-inflammatory medications as these will make you more likely to bleed and can cause stomach ulcers, can also cause Kidney problems 7) okay to take Zofran/ondansetron as needed for nausea vomiting and also okay to take Imodium as needed for diarrhea.

## 2019-09-24 DIAGNOSIS — D509 Iron deficiency anemia, unspecified: Secondary | ICD-10-CM | POA: Diagnosis not present

## 2019-09-24 DIAGNOSIS — Z992 Dependence on renal dialysis: Secondary | ICD-10-CM | POA: Diagnosis not present

## 2019-09-24 DIAGNOSIS — Z1159 Encounter for screening for other viral diseases: Secondary | ICD-10-CM | POA: Diagnosis not present

## 2019-09-24 DIAGNOSIS — Z23 Encounter for immunization: Secondary | ICD-10-CM | POA: Diagnosis not present

## 2019-09-24 DIAGNOSIS — D631 Anemia in chronic kidney disease: Secondary | ICD-10-CM | POA: Diagnosis not present

## 2019-09-24 DIAGNOSIS — N2581 Secondary hyperparathyroidism of renal origin: Secondary | ICD-10-CM | POA: Diagnosis not present

## 2019-09-24 DIAGNOSIS — N186 End stage renal disease: Secondary | ICD-10-CM | POA: Diagnosis not present

## 2019-09-26 DIAGNOSIS — N2581 Secondary hyperparathyroidism of renal origin: Secondary | ICD-10-CM | POA: Diagnosis not present

## 2019-09-26 DIAGNOSIS — N186 End stage renal disease: Secondary | ICD-10-CM | POA: Diagnosis not present

## 2019-09-26 DIAGNOSIS — D631 Anemia in chronic kidney disease: Secondary | ICD-10-CM | POA: Diagnosis not present

## 2019-09-26 DIAGNOSIS — Z992 Dependence on renal dialysis: Secondary | ICD-10-CM | POA: Diagnosis not present

## 2019-09-26 DIAGNOSIS — D509 Iron deficiency anemia, unspecified: Secondary | ICD-10-CM | POA: Diagnosis not present

## 2019-09-29 DIAGNOSIS — N186 End stage renal disease: Secondary | ICD-10-CM | POA: Diagnosis not present

## 2019-09-29 DIAGNOSIS — Z992 Dependence on renal dialysis: Secondary | ICD-10-CM | POA: Diagnosis not present

## 2019-09-29 DIAGNOSIS — D631 Anemia in chronic kidney disease: Secondary | ICD-10-CM | POA: Diagnosis not present

## 2019-09-29 DIAGNOSIS — D509 Iron deficiency anemia, unspecified: Secondary | ICD-10-CM | POA: Diagnosis not present

## 2019-09-29 DIAGNOSIS — N2581 Secondary hyperparathyroidism of renal origin: Secondary | ICD-10-CM | POA: Diagnosis not present

## 2019-10-01 DIAGNOSIS — D631 Anemia in chronic kidney disease: Secondary | ICD-10-CM | POA: Diagnosis not present

## 2019-10-01 DIAGNOSIS — D509 Iron deficiency anemia, unspecified: Secondary | ICD-10-CM | POA: Diagnosis not present

## 2019-10-01 DIAGNOSIS — Z992 Dependence on renal dialysis: Secondary | ICD-10-CM | POA: Diagnosis not present

## 2019-10-01 DIAGNOSIS — N2581 Secondary hyperparathyroidism of renal origin: Secondary | ICD-10-CM | POA: Diagnosis not present

## 2019-10-01 DIAGNOSIS — N186 End stage renal disease: Secondary | ICD-10-CM | POA: Diagnosis not present

## 2019-10-01 IMAGING — DX DG CHEST 2V
2 series · 2 of 2 positions shown · non-contrast
Comparison: 07/23/2018, 03/06/2018

CLINICAL DATA: 58-year-old male with a history of respiratory
failure previously

EXAM:
CHEST - 2 VIEW

[chest pa]
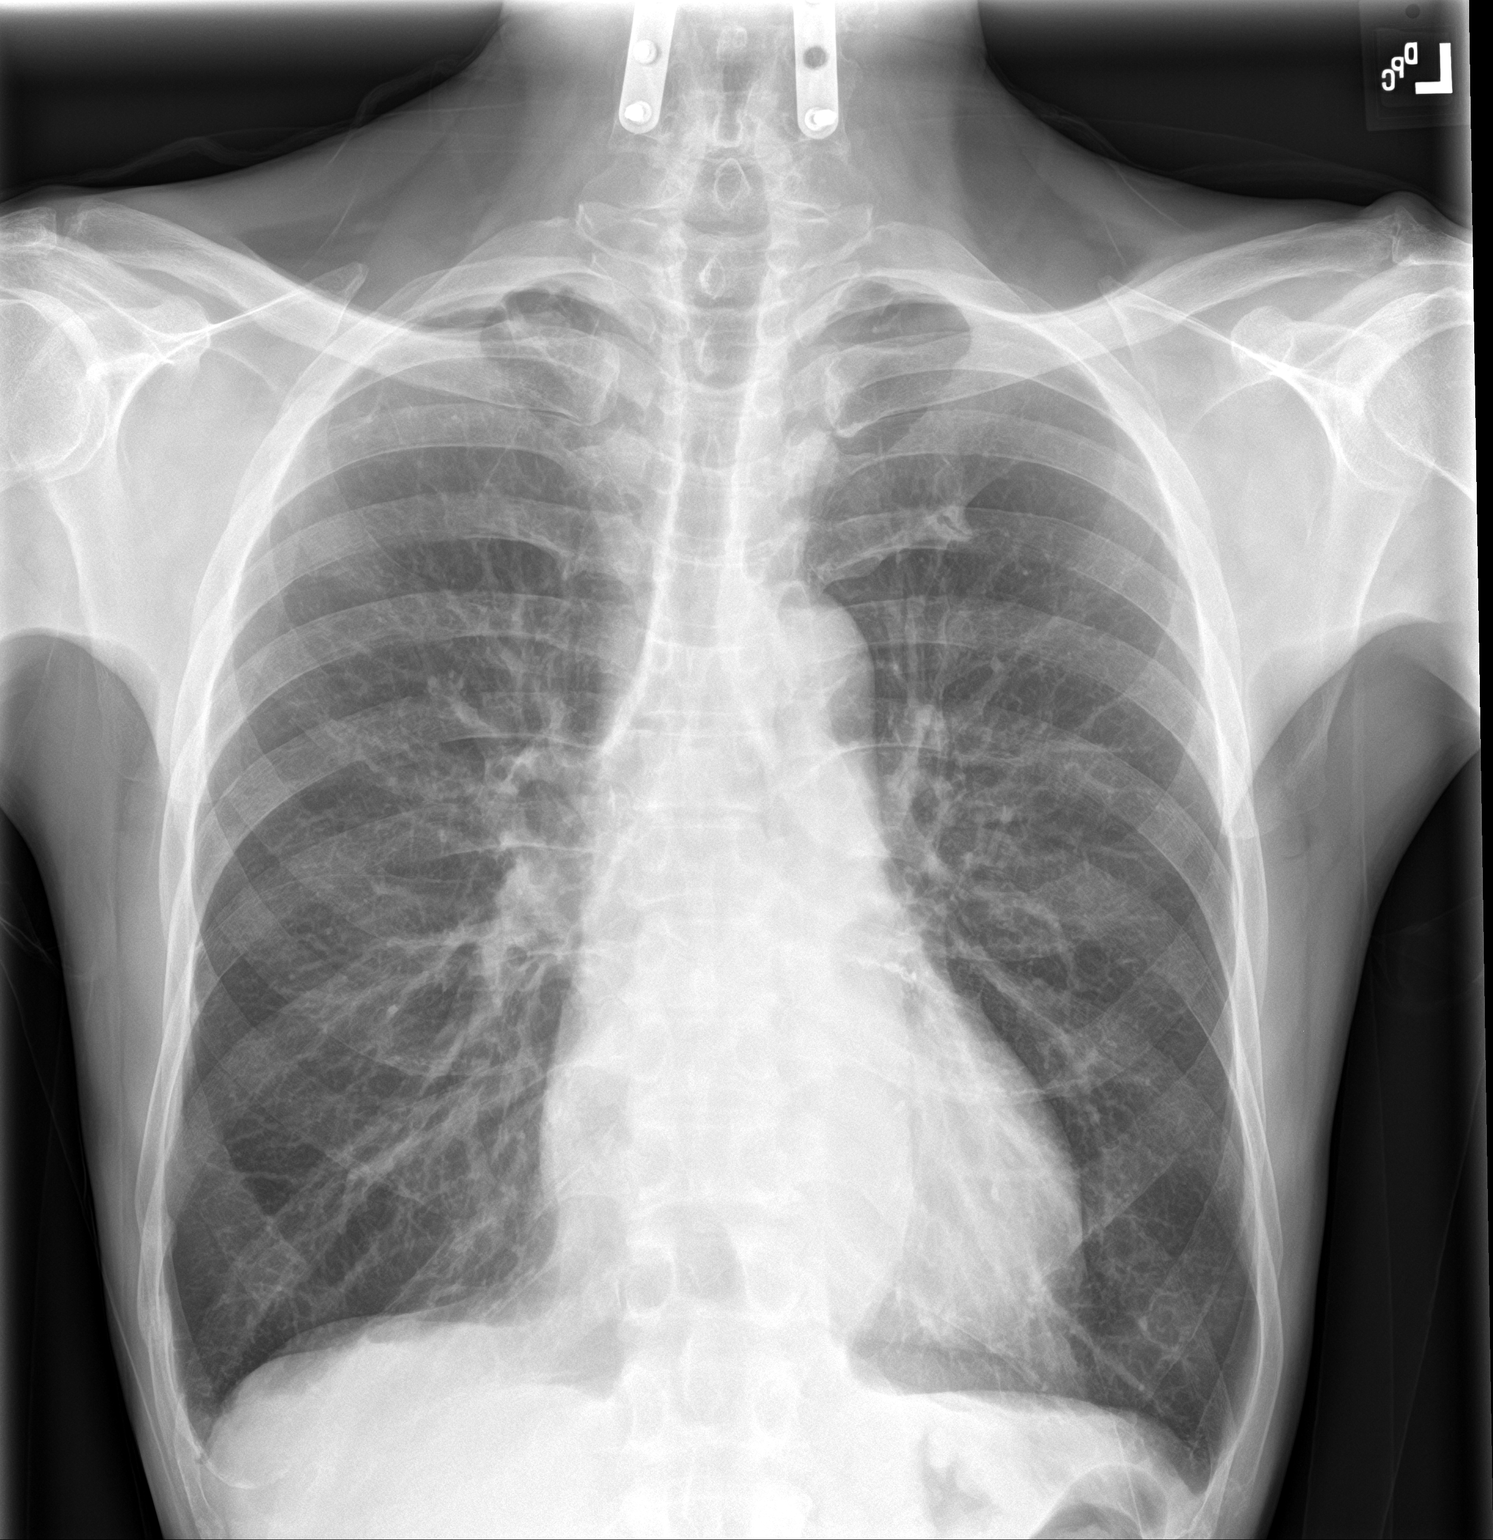

[chest lat]
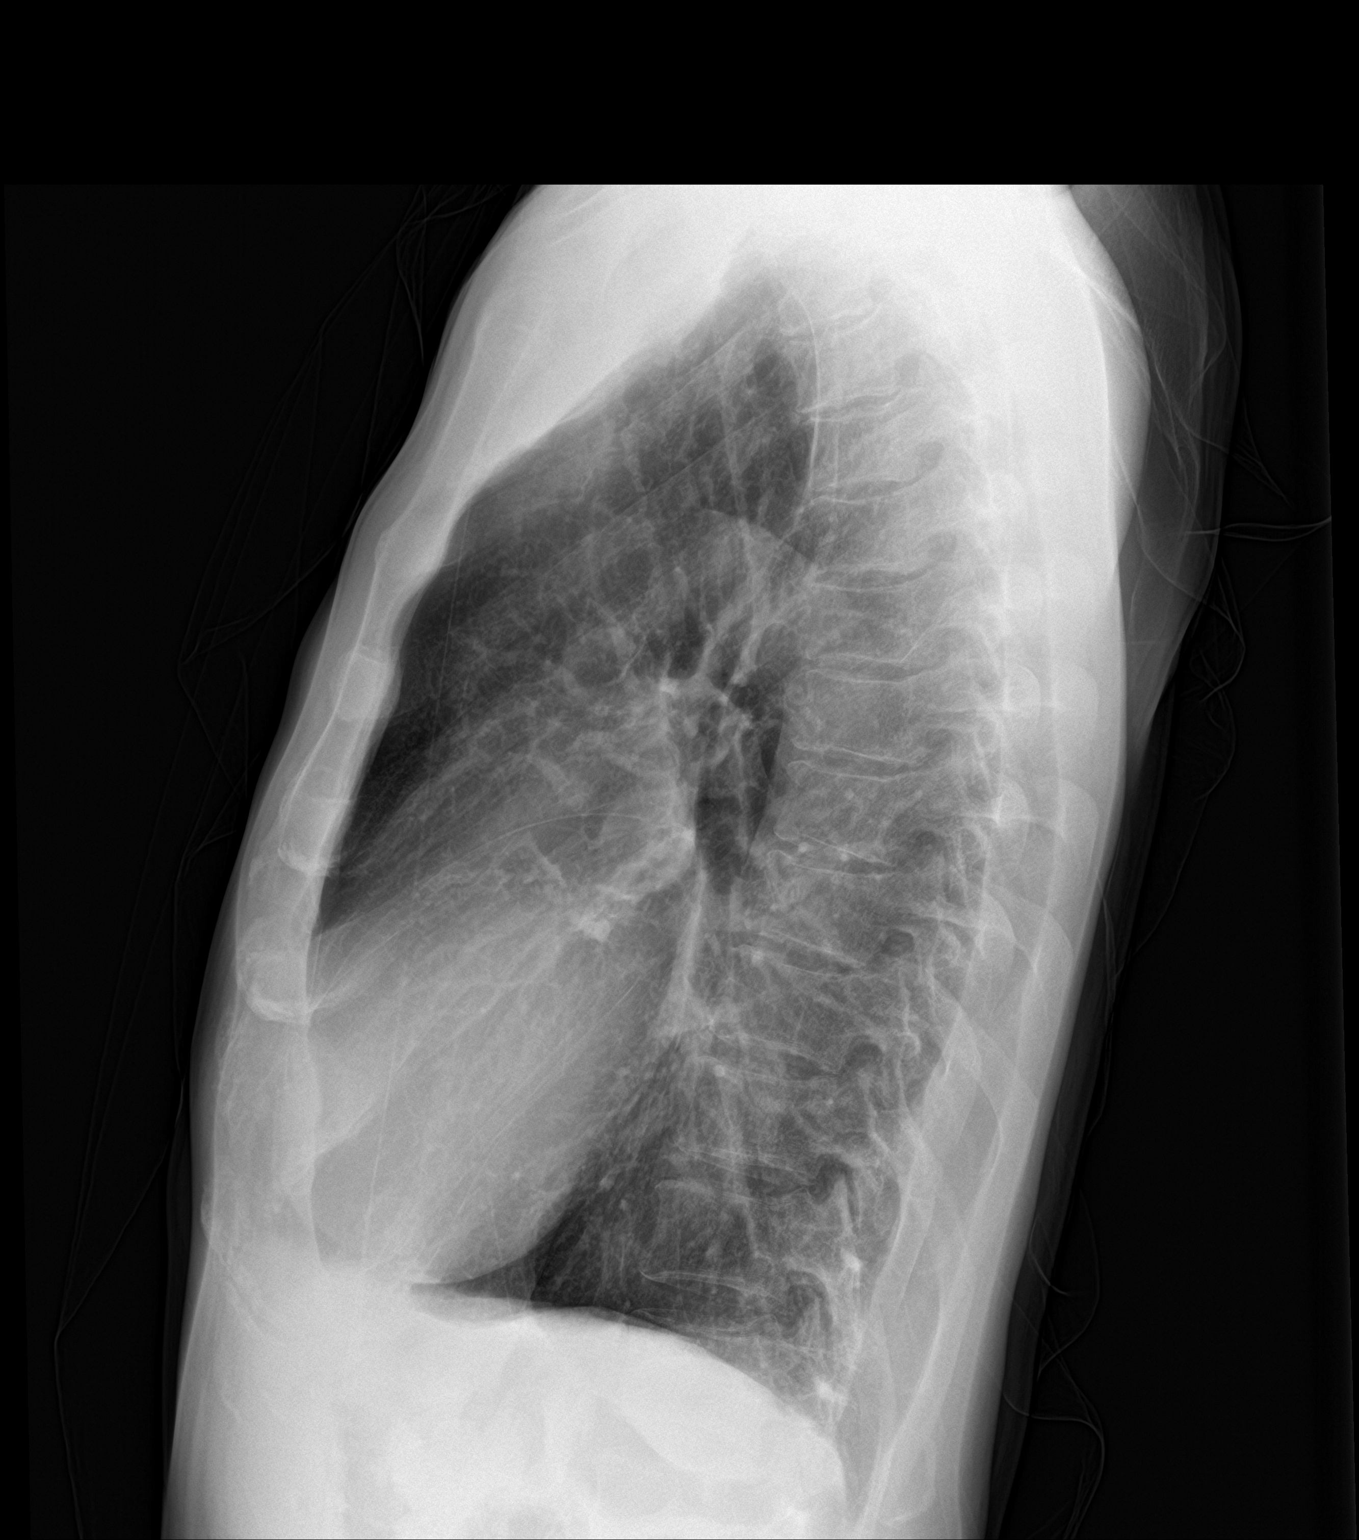

[2 of 2 positions shown; findings below may reference images not displayed]

FINDINGS: Cardiomediastinal silhouette unchanged in size and contour.

Stigmata of emphysema, with increased retrosternal airspace,
flattened hemidiaphragms, increased AP diameter, and hyperinflation
on the AP view.

Coarsened interstitial markings. Similar appearance of
pleuroparenchymal thickening at the apices.

Improved aeration compared to the prior plain film. No pneumothorax
or new confluent airspace disease.

Calcifications of the coronary arteries.

No displaced fracture.  Degenerative changes.
IMPRESSION: Improved aeration, evidence of clearing infection.

Chronic lung changes and emphysema.

## 2019-10-03 DIAGNOSIS — Z992 Dependence on renal dialysis: Secondary | ICD-10-CM | POA: Diagnosis not present

## 2019-10-03 DIAGNOSIS — N186 End stage renal disease: Secondary | ICD-10-CM | POA: Diagnosis not present

## 2019-10-03 DIAGNOSIS — D509 Iron deficiency anemia, unspecified: Secondary | ICD-10-CM | POA: Diagnosis not present

## 2019-10-03 DIAGNOSIS — N2581 Secondary hyperparathyroidism of renal origin: Secondary | ICD-10-CM | POA: Diagnosis not present

## 2019-10-03 DIAGNOSIS — D631 Anemia in chronic kidney disease: Secondary | ICD-10-CM | POA: Diagnosis not present

## 2019-10-06 DIAGNOSIS — N2581 Secondary hyperparathyroidism of renal origin: Secondary | ICD-10-CM | POA: Diagnosis not present

## 2019-10-06 DIAGNOSIS — D631 Anemia in chronic kidney disease: Secondary | ICD-10-CM | POA: Diagnosis not present

## 2019-10-06 DIAGNOSIS — N186 End stage renal disease: Secondary | ICD-10-CM | POA: Diagnosis not present

## 2019-10-06 DIAGNOSIS — D509 Iron deficiency anemia, unspecified: Secondary | ICD-10-CM | POA: Diagnosis not present

## 2019-10-06 DIAGNOSIS — Z992 Dependence on renal dialysis: Secondary | ICD-10-CM | POA: Diagnosis not present

## 2019-10-08 DIAGNOSIS — N2581 Secondary hyperparathyroidism of renal origin: Secondary | ICD-10-CM | POA: Diagnosis not present

## 2019-10-08 DIAGNOSIS — D509 Iron deficiency anemia, unspecified: Secondary | ICD-10-CM | POA: Diagnosis not present

## 2019-10-08 DIAGNOSIS — Z992 Dependence on renal dialysis: Secondary | ICD-10-CM | POA: Diagnosis not present

## 2019-10-08 DIAGNOSIS — N186 End stage renal disease: Secondary | ICD-10-CM | POA: Diagnosis not present

## 2019-10-08 DIAGNOSIS — D631 Anemia in chronic kidney disease: Secondary | ICD-10-CM | POA: Diagnosis not present

## 2019-10-10 DIAGNOSIS — N2581 Secondary hyperparathyroidism of renal origin: Secondary | ICD-10-CM | POA: Diagnosis not present

## 2019-10-10 DIAGNOSIS — D509 Iron deficiency anemia, unspecified: Secondary | ICD-10-CM | POA: Diagnosis not present

## 2019-10-10 DIAGNOSIS — D631 Anemia in chronic kidney disease: Secondary | ICD-10-CM | POA: Diagnosis not present

## 2019-10-10 DIAGNOSIS — N186 End stage renal disease: Secondary | ICD-10-CM | POA: Diagnosis not present

## 2019-10-10 DIAGNOSIS — Z992 Dependence on renal dialysis: Secondary | ICD-10-CM | POA: Diagnosis not present

## 2019-10-13 DIAGNOSIS — Z992 Dependence on renal dialysis: Secondary | ICD-10-CM | POA: Diagnosis not present

## 2019-10-13 DIAGNOSIS — D631 Anemia in chronic kidney disease: Secondary | ICD-10-CM | POA: Diagnosis not present

## 2019-10-13 DIAGNOSIS — N186 End stage renal disease: Secondary | ICD-10-CM | POA: Diagnosis not present

## 2019-10-13 DIAGNOSIS — D509 Iron deficiency anemia, unspecified: Secondary | ICD-10-CM | POA: Diagnosis not present

## 2019-10-13 DIAGNOSIS — N2581 Secondary hyperparathyroidism of renal origin: Secondary | ICD-10-CM | POA: Diagnosis not present

## 2019-10-17 DIAGNOSIS — D631 Anemia in chronic kidney disease: Secondary | ICD-10-CM | POA: Diagnosis not present

## 2019-10-17 DIAGNOSIS — Z992 Dependence on renal dialysis: Secondary | ICD-10-CM | POA: Diagnosis not present

## 2019-10-17 DIAGNOSIS — N2581 Secondary hyperparathyroidism of renal origin: Secondary | ICD-10-CM | POA: Diagnosis not present

## 2019-10-17 DIAGNOSIS — N186 End stage renal disease: Secondary | ICD-10-CM | POA: Diagnosis not present

## 2019-10-17 DIAGNOSIS — D509 Iron deficiency anemia, unspecified: Secondary | ICD-10-CM | POA: Diagnosis not present

## 2019-10-20 DIAGNOSIS — D509 Iron deficiency anemia, unspecified: Secondary | ICD-10-CM | POA: Diagnosis not present

## 2019-10-20 DIAGNOSIS — Z992 Dependence on renal dialysis: Secondary | ICD-10-CM | POA: Diagnosis not present

## 2019-10-20 DIAGNOSIS — D631 Anemia in chronic kidney disease: Secondary | ICD-10-CM | POA: Diagnosis not present

## 2019-10-20 DIAGNOSIS — N186 End stage renal disease: Secondary | ICD-10-CM | POA: Diagnosis not present

## 2019-10-20 DIAGNOSIS — N2581 Secondary hyperparathyroidism of renal origin: Secondary | ICD-10-CM | POA: Diagnosis not present

## 2019-10-21 DIAGNOSIS — Z23 Encounter for immunization: Secondary | ICD-10-CM | POA: Diagnosis not present

## 2019-10-22 DIAGNOSIS — N186 End stage renal disease: Secondary | ICD-10-CM | POA: Diagnosis not present

## 2019-10-22 DIAGNOSIS — N2581 Secondary hyperparathyroidism of renal origin: Secondary | ICD-10-CM | POA: Diagnosis not present

## 2019-10-22 DIAGNOSIS — D631 Anemia in chronic kidney disease: Secondary | ICD-10-CM | POA: Diagnosis not present

## 2019-10-22 DIAGNOSIS — D509 Iron deficiency anemia, unspecified: Secondary | ICD-10-CM | POA: Diagnosis not present

## 2019-10-22 DIAGNOSIS — Z992 Dependence on renal dialysis: Secondary | ICD-10-CM | POA: Diagnosis not present

## 2019-10-24 ENCOUNTER — Other Ambulatory Visit: Payer: Self-pay

## 2019-10-24 ENCOUNTER — Encounter (HOSPITAL_COMMUNITY)
Admission: RE | Admit: 2019-10-24 | Discharge: 2019-10-24 | Disposition: A | Discharge status: Home / Self Care | Payer: Medicare Other | Source: Ambulatory Visit | Attending: Gastroenterology | Admitting: Gastroenterology

## 2019-10-24 ENCOUNTER — Other Ambulatory Visit (HOSPITAL_COMMUNITY)
Admission: RE | Admit: 2019-10-24 | Discharge: 2019-10-24 | Disposition: A | Discharge status: Home / Self Care | Payer: Medicare Other | Source: Ambulatory Visit | Attending: Gastroenterology | Admitting: Gastroenterology

## 2019-10-24 DIAGNOSIS — Z20822 Contact with and (suspected) exposure to covid-19: Secondary | ICD-10-CM | POA: Diagnosis not present

## 2019-10-24 DIAGNOSIS — D509 Iron deficiency anemia, unspecified: Secondary | ICD-10-CM | POA: Diagnosis not present

## 2019-10-24 DIAGNOSIS — Z01812 Encounter for preprocedural laboratory examination: Secondary | ICD-10-CM | POA: Diagnosis not present

## 2019-10-24 DIAGNOSIS — N186 End stage renal disease: Secondary | ICD-10-CM | POA: Diagnosis not present

## 2019-10-24 DIAGNOSIS — N2581 Secondary hyperparathyroidism of renal origin: Secondary | ICD-10-CM | POA: Diagnosis not present

## 2019-10-24 DIAGNOSIS — D631 Anemia in chronic kidney disease: Secondary | ICD-10-CM | POA: Diagnosis not present

## 2019-10-24 DIAGNOSIS — Z992 Dependence on renal dialysis: Secondary | ICD-10-CM | POA: Diagnosis not present

## 2019-10-25 LAB — SARS CORONAVIRUS 2 (TAT 6-24 HRS): SARS Coronavirus 2: NEGATIVE

## 2019-10-26 IMAGING — DX DG CHEST 2V
2 series · 2 of 2 positions shown · non-contrast
Comparison: August 23, 2018

CLINICAL DATA: Diagnosis flu 2 weeks ago.  Shortness of breath.

EXAM:
CHEST - 2 VIEW

[chest pa]
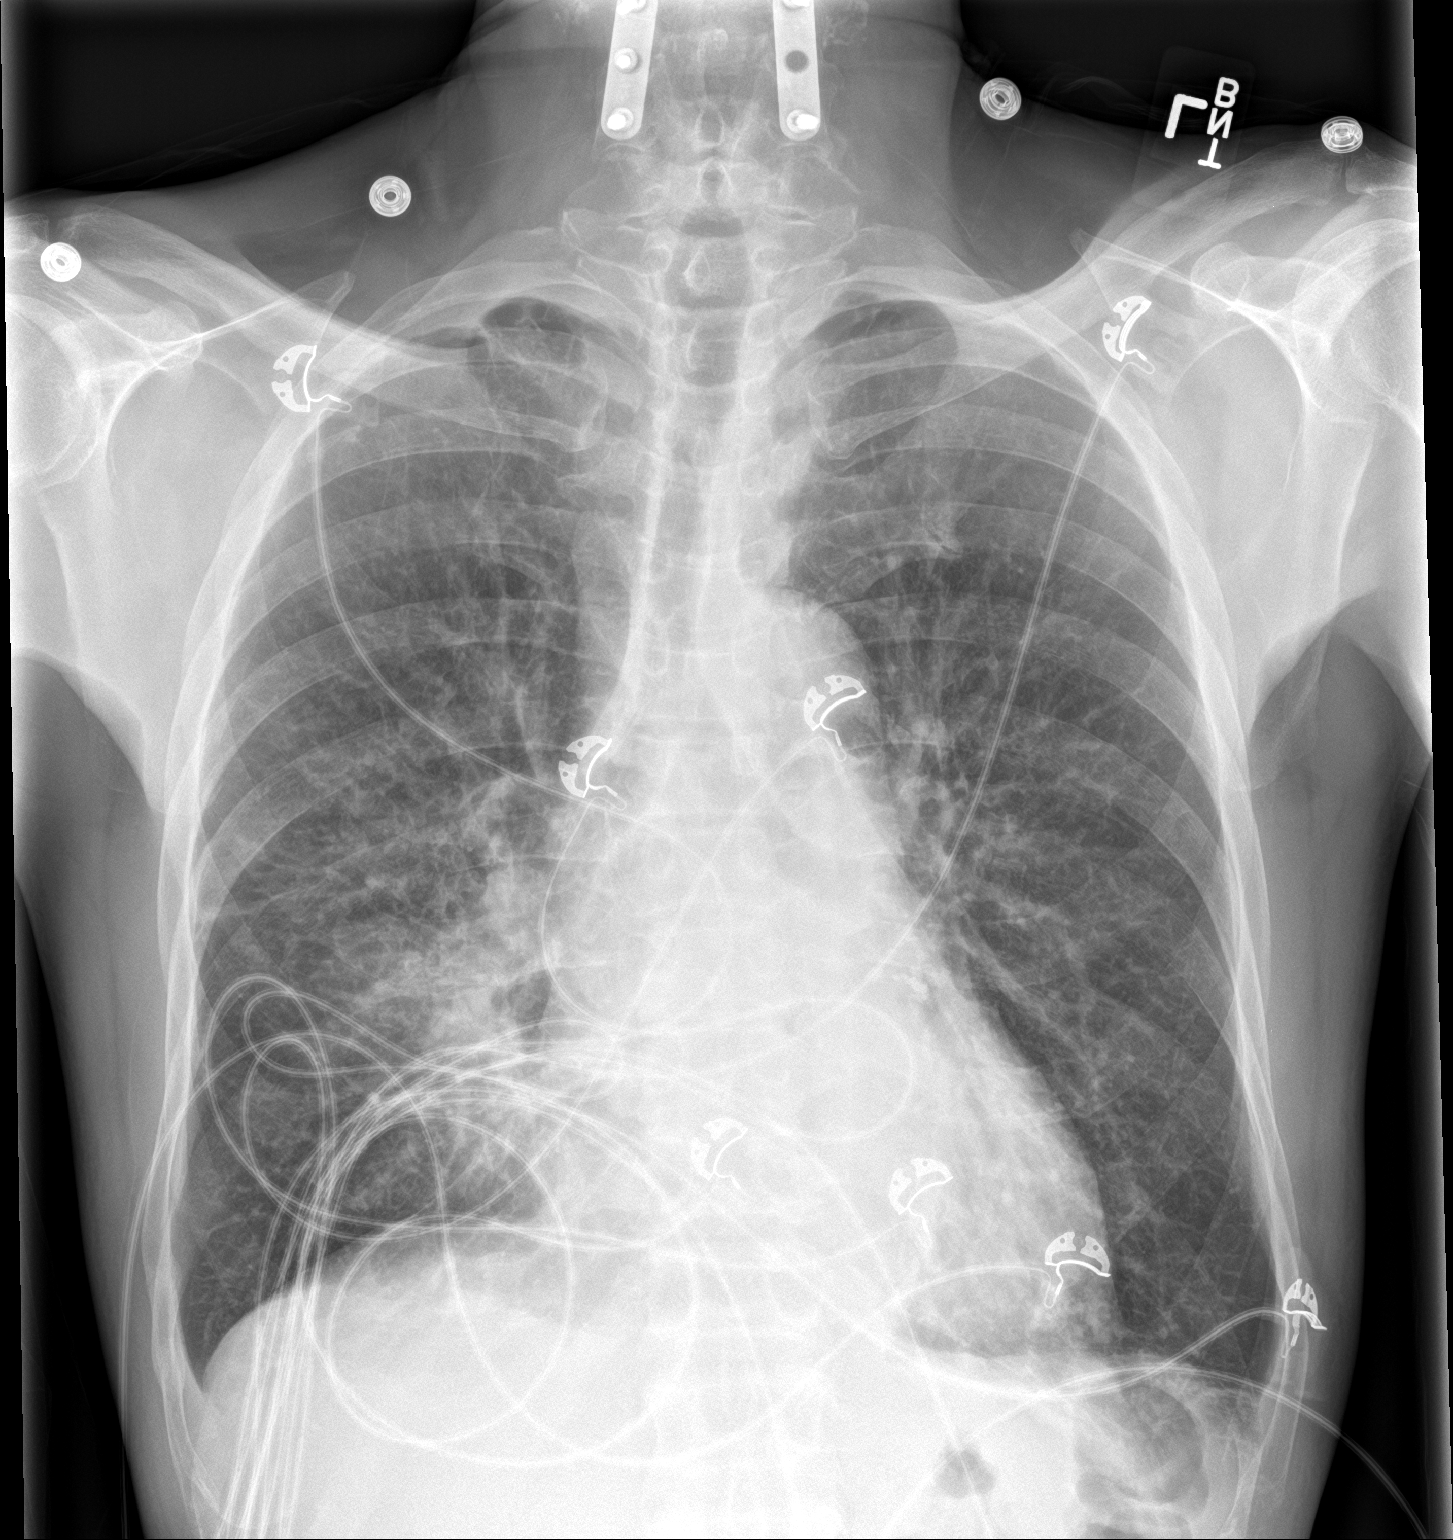

[chest lat]
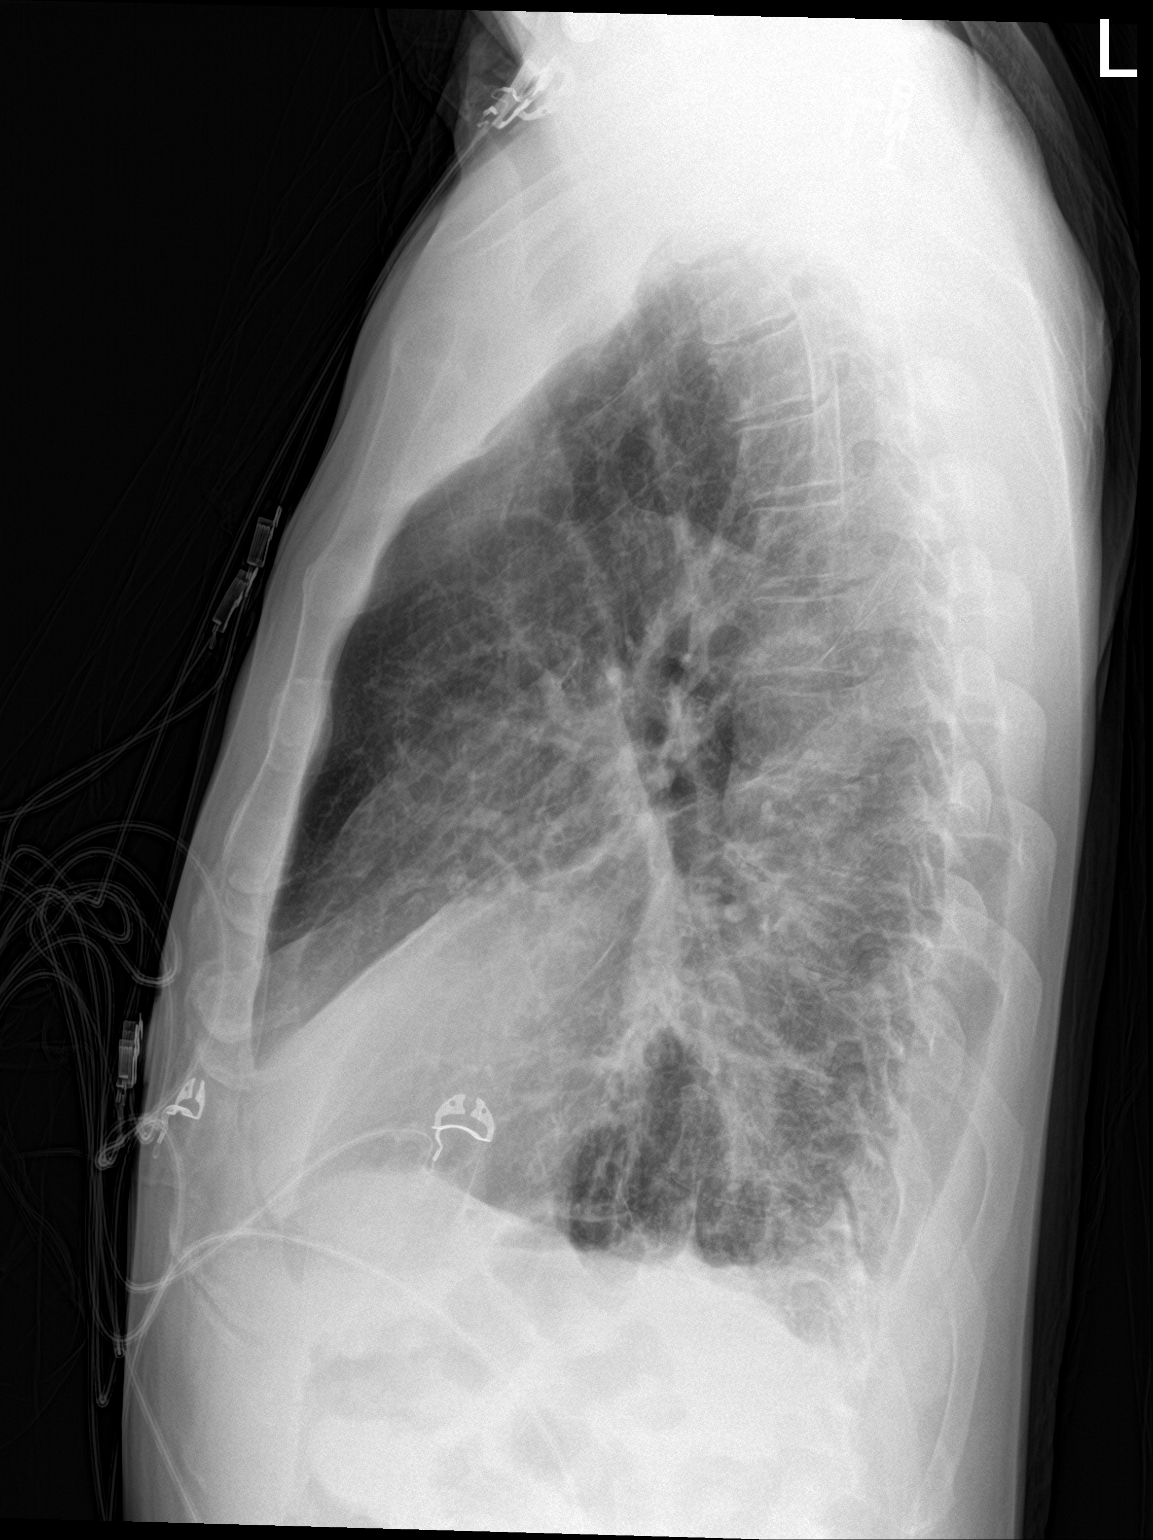

[2 of 2 positions shown; findings below may reference images not displayed]

FINDINGS: No pneumothorax. The heart, hila, and mediastinum are normal. Right
middle lobe opacity is identified suggestive of pneumonia given
history. Mild increased interstitial markings centrally in the
lungs. No other acute abnormalities.
IMPRESSION: 1. Right middle lobe infiltrate, likely pneumonia given history.
Recommend follow-up to resolution.
2. Mild increased interstitial markings centrally may represent
pulmonary venous congestion or atypical infection. Recommend
attention on follow-up.

## 2019-10-27 DIAGNOSIS — N186 End stage renal disease: Secondary | ICD-10-CM | POA: Diagnosis not present

## 2019-10-27 DIAGNOSIS — N2581 Secondary hyperparathyroidism of renal origin: Secondary | ICD-10-CM | POA: Diagnosis not present

## 2019-10-27 DIAGNOSIS — D631 Anemia in chronic kidney disease: Secondary | ICD-10-CM | POA: Diagnosis not present

## 2019-10-27 DIAGNOSIS — Z992 Dependence on renal dialysis: Secondary | ICD-10-CM | POA: Diagnosis not present

## 2019-10-27 DIAGNOSIS — D509 Iron deficiency anemia, unspecified: Secondary | ICD-10-CM | POA: Diagnosis not present

## 2019-10-28 ENCOUNTER — Ambulatory Visit (HOSPITAL_COMMUNITY): Payer: Medicare Other | Admitting: Certified Registered Nurse Anesthetist

## 2019-10-28 ENCOUNTER — Telehealth: Payer: Self-pay | Admitting: Gastroenterology

## 2019-10-28 ENCOUNTER — Encounter (HOSPITAL_COMMUNITY): Payer: Self-pay | Admitting: Gastroenterology

## 2019-10-28 ENCOUNTER — Encounter (HOSPITAL_COMMUNITY)
Admission: RE | Disposition: A | Discharge status: Home / Self Care | Payer: Self-pay | Source: Home / Self Care | Attending: Gastroenterology

## 2019-10-28 ENCOUNTER — Ambulatory Visit (HOSPITAL_COMMUNITY)
Admission: RE | Admit: 2019-10-28 | Discharge: 2019-10-28 | Disposition: A | Discharge status: Home / Self Care | Payer: Medicare Other | Attending: Gastroenterology | Admitting: Gastroenterology

## 2019-10-28 ENCOUNTER — Other Ambulatory Visit: Payer: Self-pay

## 2019-10-28 DIAGNOSIS — K222 Esophageal obstruction: Secondary | ICD-10-CM | POA: Insufficient documentation

## 2019-10-28 DIAGNOSIS — R634 Abnormal weight loss: Secondary | ICD-10-CM | POA: Diagnosis not present

## 2019-10-28 DIAGNOSIS — K644 Residual hemorrhoidal skin tags: Secondary | ICD-10-CM | POA: Insufficient documentation

## 2019-10-28 DIAGNOSIS — K2970 Gastritis, unspecified, without bleeding: Secondary | ICD-10-CM

## 2019-10-28 DIAGNOSIS — Z7902 Long term (current) use of antithrombotics/antiplatelets: Secondary | ICD-10-CM | POA: Insufficient documentation

## 2019-10-28 DIAGNOSIS — K319 Disease of stomach and duodenum, unspecified: Secondary | ICD-10-CM | POA: Insufficient documentation

## 2019-10-28 DIAGNOSIS — J449 Chronic obstructive pulmonary disease, unspecified: Secondary | ICD-10-CM | POA: Diagnosis not present

## 2019-10-28 DIAGNOSIS — K449 Diaphragmatic hernia without obstruction or gangrene: Secondary | ICD-10-CM | POA: Insufficient documentation

## 2019-10-28 DIAGNOSIS — K766 Portal hypertension: Secondary | ICD-10-CM

## 2019-10-28 DIAGNOSIS — Z888 Allergy status to other drugs, medicaments and biological substances status: Secondary | ICD-10-CM | POA: Diagnosis not present

## 2019-10-28 DIAGNOSIS — Z79899 Other long term (current) drug therapy: Secondary | ICD-10-CM | POA: Diagnosis not present

## 2019-10-28 DIAGNOSIS — D649 Anemia, unspecified: Secondary | ICD-10-CM

## 2019-10-28 DIAGNOSIS — Z7951 Long term (current) use of inhaled steroids: Secondary | ICD-10-CM | POA: Insufficient documentation

## 2019-10-28 DIAGNOSIS — K3189 Other diseases of stomach and duodenum: Secondary | ICD-10-CM | POA: Diagnosis not present

## 2019-10-28 DIAGNOSIS — N186 End stage renal disease: Secondary | ICD-10-CM | POA: Insufficient documentation

## 2019-10-28 DIAGNOSIS — F17210 Nicotine dependence, cigarettes, uncomplicated: Secondary | ICD-10-CM | POA: Diagnosis not present

## 2019-10-28 DIAGNOSIS — K219 Gastro-esophageal reflux disease without esophagitis: Secondary | ICD-10-CM | POA: Insufficient documentation

## 2019-10-28 DIAGNOSIS — I739 Peripheral vascular disease, unspecified: Secondary | ICD-10-CM | POA: Insufficient documentation

## 2019-10-28 DIAGNOSIS — Q438 Other specified congenital malformations of intestine: Secondary | ICD-10-CM | POA: Diagnosis not present

## 2019-10-28 DIAGNOSIS — K648 Other hemorrhoids: Secondary | ICD-10-CM | POA: Diagnosis not present

## 2019-10-28 DIAGNOSIS — Z681 Body mass index (BMI) 19 or less, adult: Secondary | ICD-10-CM | POA: Insufficient documentation

## 2019-10-28 DIAGNOSIS — K2980 Duodenitis without bleeding: Secondary | ICD-10-CM

## 2019-10-28 DIAGNOSIS — I120 Hypertensive chronic kidney disease with stage 5 chronic kidney disease or end stage renal disease: Secondary | ICD-10-CM | POA: Insufficient documentation

## 2019-10-28 DIAGNOSIS — D539 Nutritional anemia, unspecified: Secondary | ICD-10-CM | POA: Diagnosis not present

## 2019-10-28 DIAGNOSIS — Z992 Dependence on renal dialysis: Secondary | ICD-10-CM | POA: Insufficient documentation

## 2019-10-28 HISTORY — PX: BIOPSY: SHX5522

## 2019-10-28 HISTORY — PX: ESOPHAGOGASTRODUODENOSCOPY (EGD) WITH PROPOFOL: SHX5813

## 2019-10-28 HISTORY — PX: COLONOSCOPY WITH PROPOFOL: SHX5780

## 2019-10-28 LAB — BASIC METABOLIC PANEL
Anion gap: 13 (ref 5–15)
BUN: 27 mg/dL — ABNORMAL HIGH (ref 6–20)
CO2: 24 mmol/L (ref 22–32)
Calcium: 8.1 mg/dL — ABNORMAL LOW (ref 8.9–10.3)
Chloride: 99 mmol/L (ref 98–111)
Creatinine, Ser: 6.89 mg/dL — ABNORMAL HIGH (ref 0.61–1.24)
GFR calc Af Amer: 9 mL/min — ABNORMAL LOW (ref >60)
GFR calc non Af Amer: 8 mL/min — ABNORMAL LOW (ref >60)
Glucose, Bld: 77 mg/dL (ref 70–99)
Potassium: 4.5 mmol/L (ref 3.5–5.1)
Sodium: 136 mmol/L (ref 135–145)

## 2019-10-28 LAB — CBC
HCT: 24.2 % — ABNORMAL LOW (ref 39.0–52.0)
Hemoglobin: 7.7 g/dL — ABNORMAL LOW (ref 13.0–17.0)
MCH: 34.4 pg — ABNORMAL HIGH (ref 26.0–34.0)
MCHC: 31.8 g/dL (ref 30.0–36.0)
MCV: 108 fL — ABNORMAL HIGH (ref 80.0–100.0)
Platelets: 284 10*3/uL (ref 150–400)
RBC: 2.24 MIL/uL — ABNORMAL LOW (ref 4.22–5.81)
RDW: 16.2 % — ABNORMAL HIGH (ref 11.5–15.5)
WBC: 8 10*3/uL (ref 4.0–10.5)
nRBC: 0 % (ref 0.0–0.2)

## 2019-10-28 LAB — TSH: TSH: 2.448 u[IU]/mL (ref 0.350–4.500)

## 2019-10-28 IMAGING — DX DG CHEST 2V
2 series · 2 of 2 positions shown · non-contrast
Comparison: 09/02/2018

CLINICAL DATA: Shortness of breath.

EXAM:
CHEST - 2 VIEW

[chest pa]
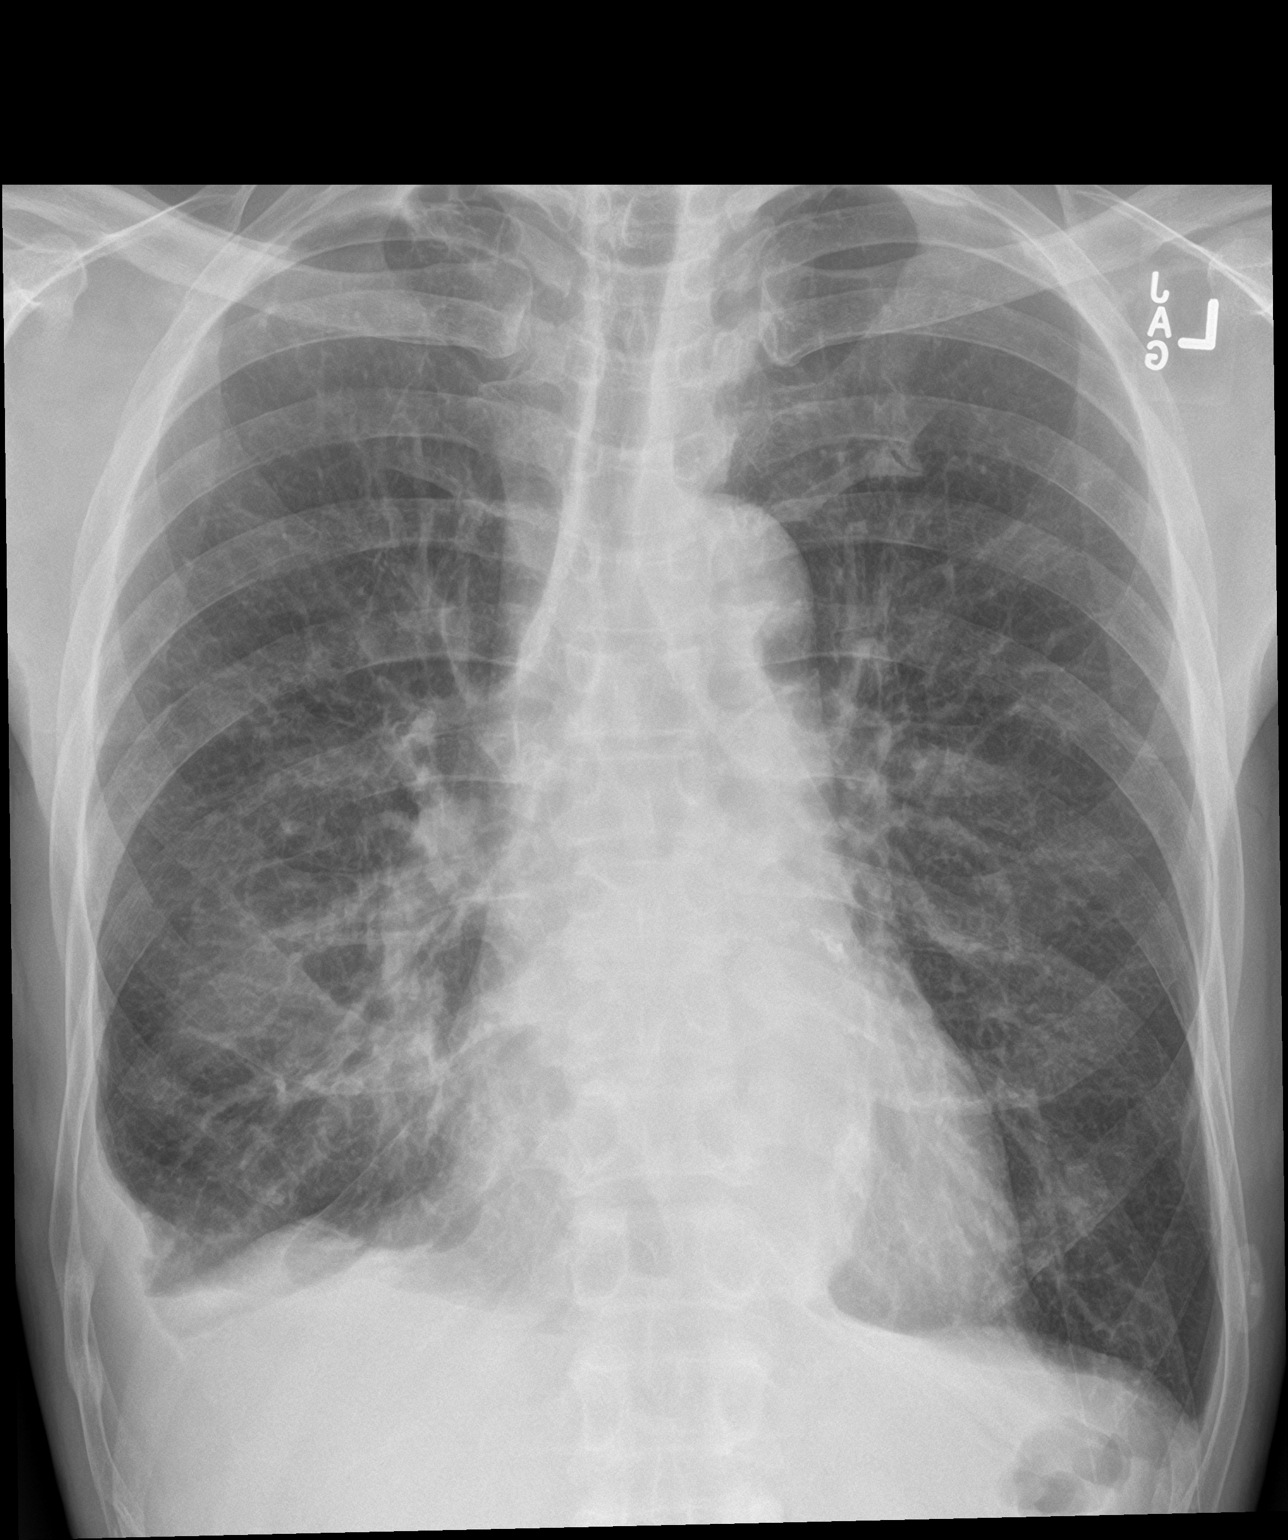

[chest lat]
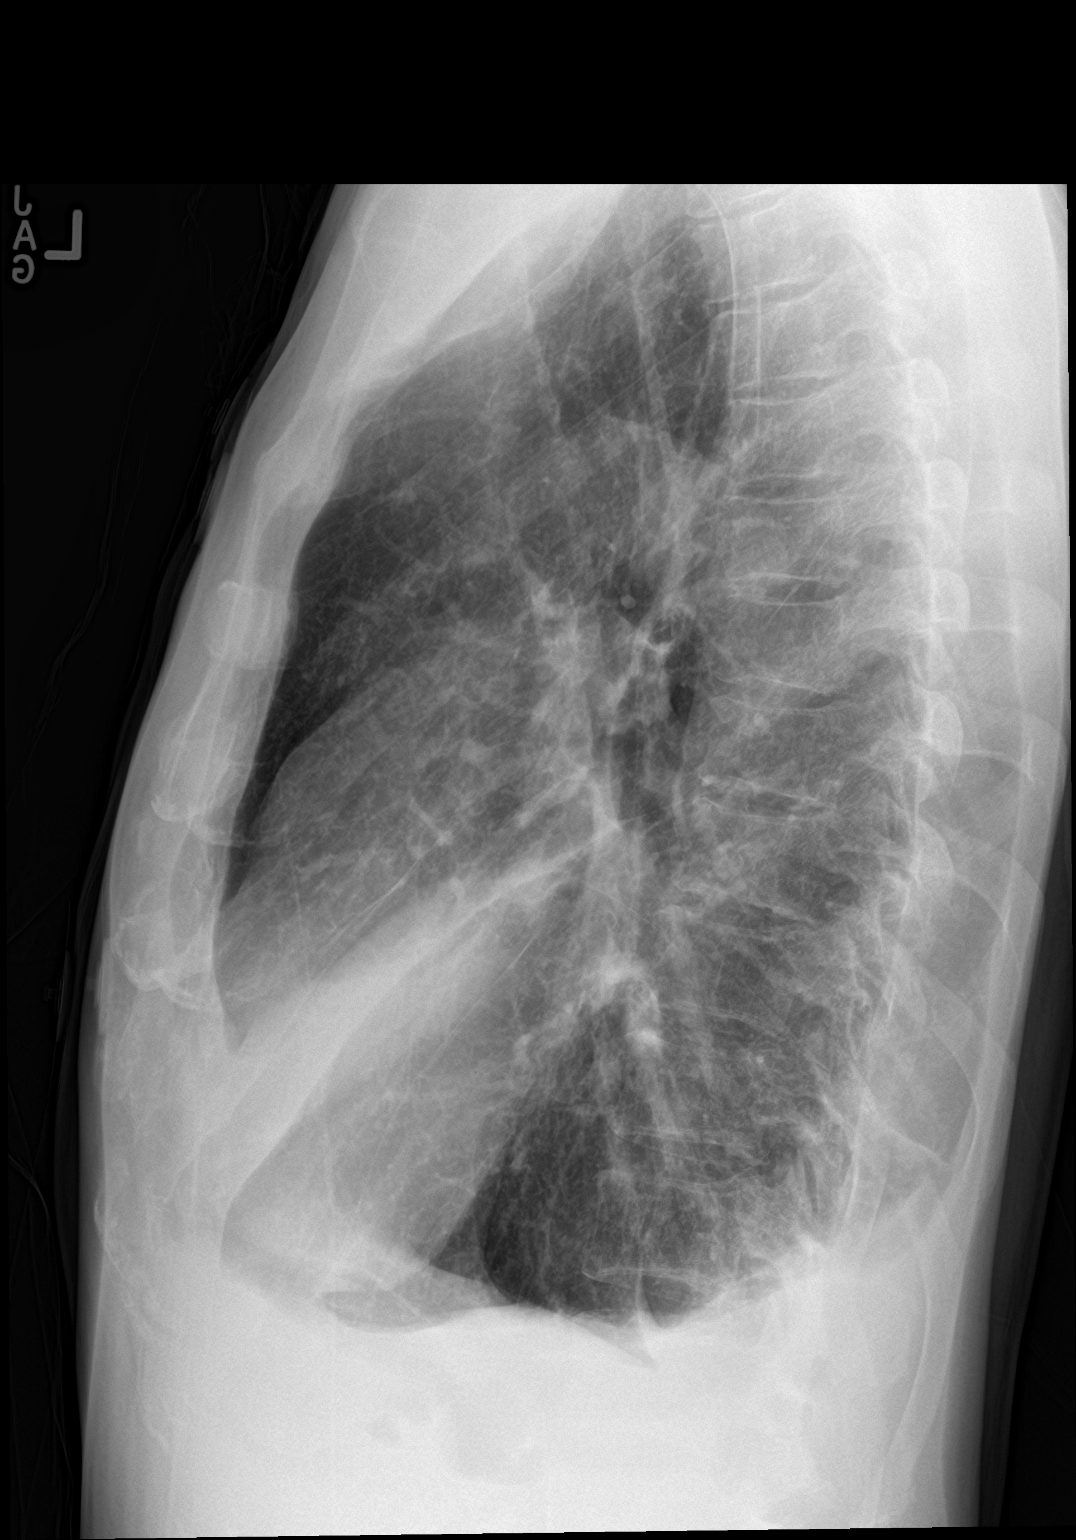

[2 of 2 positions shown; findings below may reference images not displayed]

FINDINGS: The cardiomediastinal silhouette is unchanged with normal heart
size. Right middle lobe opacity is unchanged. Central interstitial
prominence on the prior study is stable to slightly decreased. There
is scarring in the right greater than left lung apices. There is a
new small right pleural effusion. No pneumothorax is identified. No
acute osseous abnormality is seen.
IMPRESSION: 1. New small right pleural effusion.
2. Unchanged right middle lobe opacity compatible with pneumonia.

## 2019-10-28 SURGERY — COLONOSCOPY WITH PROPOFOL
Anesthesia: General

## 2019-10-28 MED ORDER — LIDOCAINE 2% (20 MG/ML) 5 ML SYRINGE
INTRAMUSCULAR | Status: AC
  Filled 2019-10-28: qty 5

## 2019-10-28 MED ORDER — KETAMINE HCL 50 MG/5ML IJ SOSY
PREFILLED_SYRINGE | INTRAMUSCULAR | Status: AC
  Filled 2019-10-28: qty 5

## 2019-10-28 MED ORDER — SODIUM CHLORIDE 0.9 % IV SOLN: INTRAVENOUS | Status: DC

## 2019-10-28 MED ORDER — LIDOCAINE HCL (CARDIAC) PF 100 MG/5ML IV SOSY
PREFILLED_SYRINGE | INTRAVENOUS | Status: DC | PRN
  Administered 2019-10-28: 40 mg via INTRATRACHEAL

## 2019-10-28 MED ORDER — CHLORHEXIDINE GLUCONATE CLOTH 2 % EX PADS: 6.0000 | MEDICATED_PAD | Freq: Once | CUTANEOUS | Status: DC

## 2019-10-28 MED ORDER — PROPOFOL 500 MG/50ML IV EMUL
INTRAVENOUS | Status: DC | PRN
  Administered 2019-10-28: 200 ug/kg/min via INTRAVENOUS

## 2019-10-28 MED ORDER — LACTATED RINGERS IV SOLN: INTRAVENOUS | Status: DC

## 2019-10-28 MED ORDER — KETAMINE HCL 10 MG/ML IJ SOLN
INTRAMUSCULAR | Status: DC | PRN
  Administered 2019-10-28 (×2): 10 mg via INTRAVENOUS

## 2019-10-28 MED ORDER — PROPOFOL 10 MG/ML IV BOLUS
INTRAVENOUS | Status: DC | PRN
  Administered 2019-10-28 (×2): 50 ug via INTRAVENOUS

## 2019-10-28 NOTE — Op Note (Signed)
[facility] Patient Name: Luke Mueller Procedure Date: 10/28/2019 11:58 AM MRN: [id number] Date of Birth: 1961/01/29 Attending MD: Barney Drain MD, MD CSN: [id number] Age: 59 Admit Type: Outpatient Procedure:                Colonoscopy, DIAGNOSTIC Indications:              Anemia, Weight loss Providers:                Barney Drain MD, MD, Otis Peak B. Sharon Seller, RN,                            Randa Spike, Technician Referring MD:             Rosita Fire MD, MD Medicines:                Propofol per Anesthesia Complications:            No immediate complications. Estimated Blood Loss:     Estimated blood loss: none. Procedure:                Pre-Anesthesia Assessment:                           - Prior to the procedure, a History and Physical                            was performed, and patient medications and                            allergies were reviewed. The patient's tolerance of                            previous anesthesia was also reviewed. The risks                            and benefits of the procedure and the sedation                            options and risks were discussed with the patient.                            All questions were answered, and informed consent                            was obtained. Prior Anticoagulants: The patient                            last took heparin 1 day prior to the procedure. ASA                            Grade Assessment: III - A patient with severe                            systemic disease. After reviewing the risks and  benefits, the patient was deemed in satisfactory                            condition to undergo the procedure. After obtaining                            informed consent, the colonoscope was passed under                            direct vision. Throughout the procedure, the                            patient's blood pressure, pulse, and oxygen   saturations were monitored continuously. The                            CF-[id number] ([id number]) scope was introduced through                            the anus and advanced to the 10 cm into the ileum.                            The colonoscopy was technically difficult and                            complex due to restricted mobility of the colon and                            a tortuous colon. Successful completion of the                            procedure was aided by changing the patient to a                            prone position, straightening and shortening the                            scope to obtain bowel loop reduction and COLOWRAP.                            The patient tolerated the procedure well. The                            quality of the bowel preparation was poor. The                            terminal ileum, ileocecal valve, appendiceal                            orifice, and rectum were photographed. Scope In: 12:37:09 PM Scope Out: 1:01:31 PM Scope Withdrawal Time: 0 hours 17 minutes 24 seconds  Total Procedure Duration: 0 hours 24 minutes 22 seconds  Findings:      The terminal ileum appeared normal.  The recto-sigmoid colon, sigmoid colon, descending colon and splenic       flexure were moderately tortuous.      External and internal hemorrhoids were found. The hemorrhoids were       moderate. Impression:               - Preparation of the colon was poor.                           - NO SOURCE FOR WEIGHT LOSS OR ANEMIA IDENTIFIED                           - The examined portion of the ileum was normal.                           - Tortuous colon.                           - External and internal hemorrhoids. Moderate Sedation:      Per Anesthesia Care Recommendation:           - Patient has a contact number available for                            emergencies. The signs and symptoms of potential                            delayed complications were  discussed with the                            patient. Return to normal activities tomorrow.                            Written discharge instructions were provided to the                            patient.                           - High fiber diet.                           - Continue present medications.                           - PROCEED TO EGD.                           - Repeat colonoscopy in 1 year for surveillance.                           - Return to GI office in 4 months. Procedure Code(s):        --- Professional ---                           [phone], Colonoscopy, flexible; diagnostic, including  collection of specimen(s) by brushing or washing,                            when performed (separate procedure) Diagnosis Code(s):        --- Professional ---                           K64.8, Other hemorrhoids                           D64.9, Anemia, unspecified                           R63.4, Abnormal weight loss                           Q43.8, Other specified congenital malformations of                            intestine CPT copyright 2019 [employer]. All rights reserved. The codes documented in this report are preliminary and upon coder review may  be revised to meet current compliance requirements. Barney Drain, MD Barney Drain MD, MD 10/28/2019 1:33:35 PM This report has been signed electronically. Number of Addenda: 0

## 2019-10-28 NOTE — Anesthesia Postprocedure Evaluation (Signed)
Anesthesia Post Note  Patient: Luke Mueller  Procedure(s) Performed: COLONOSCOPY WITH PROPOFOL (N/A ) ESOPHAGOGASTRODUODENOSCOPY (EGD) WITH PROPOFOL (N/A ) BIOPSY  Patient location during evaluation: Endoscopy Anesthesia Type: General Level of consciousness: awake and alert Pain management: pain level controlled Vital Signs Assessment: post-procedure vital signs reviewed and stable Respiratory status: spontaneous breathing, nonlabored ventilation, respiratory function stable and patient connected to nasal cannula oxygen Cardiovascular status: blood pressure returned to baseline and stable Postop Assessment: no apparent nausea or vomiting Anesthetic complications: no     Last Vitals:  Vitals:   10/28/19 1330 10/28/19 1345  BP: (!) 170/89 (!) 182/87  Pulse: 91 89  Resp: (!) 23 18  Temp: 36.6 C   SpO2: 93% 92%    Last Pain:  Vitals:   10/28/19 1330  TempSrc:   PainSc: Asleep                 Talitha Givens

## 2019-10-28 NOTE — Discharge Instructions (Signed)
NO SOURCE FOR YOUR WEIGHT LOSS OR LOW BLOOD COUNT WAS IDENTIFIED. You have gastritis & duodenitis. You have internal hemorrhoids.  I BIOPSIED YOUR STOMACH AND SMALL BOWEL.    DRINK WATER TO KEEP YOUR URINE LIGHT YELLOW.  AVOID ITEMS THAT TRIGGER GASTRITIS. SEE INFO BELOW.  FOLLOW A HIGH FIBER DIET. AVOID ITEMS THAT CAUSE BLOATING. SEE INFO BELOW.   Complete CT ABDOMEN AND PELVIS WITHIN 7-10 DAYS TO COMPLETE YOUR EVALUATION FOR WEIGHT LOSS.  I will check your thyroid test today.   YOUR BIOPSY RESULTS WILL BE BACK IN 5 BUSINESS DAYS.  FOLLOW UP IN 4 MOS.  YOUR BOWEL PREP WAS NO ADEQUATE TO DETECT SMALL POLYPS. YOU SHOULD HAVE A REPEAT COLONOSCOPY WITHIN THE NEXT YEAR.    ENDOSCOPY Care After Read the instructions outlined below and refer to this sheet in the next week. These discharge instructions provide you with general information on caring for yourself after you leave the hospital. While your treatment has been planned according to the most current medical practices available, unavoidable complications occasionally occur. If you have any problems or questions after discharge, call DR. Diona Peregoy, [phone].  ACTIVITY  You may resume your regular activity, but move at a slower pace for the next 24 hours.   Take frequent rest periods for the next 24 hours.   Walking will help get rid of the air and reduce the bloated feeling in your belly (abdomen).   No driving for 24 hours (because of the medicine (anesthesia) used during the test).   You may shower.   Do not sign any important legal documents or operate any machinery for 24 hours (because of the anesthesia used during the test).    NUTRITION  Drink plenty of fluids.   You may resume your normal diet as instructed by your doctor.   Begin with a light meal and progress to your normal diet. Heavy or fried foods are harder to digest and may make you feel sick to your stomach (nauseated).   Avoid alcoholic beverages for  24 hours or as instructed.    MEDICATIONS  You may resume your normal medications.   WHAT YOU CAN EXPECT TODAY  Some feelings of bloating in the abdomen.   Passage of more gas than usual.   Spotting of blood in your stool or on the toilet paper  .  IF YOU HAD POLYPS REMOVED DURING THE ENDOSCOPY:  Eat a soft diet IF YOU HAVE NAUSEA, BLOATING, ABDOMINAL PAIN, OR VOMITING.    FINDING OUT THE RESULTS OF YOUR TEST Not all test results are available during your visit. DR. Oneida Alar WILL CALL YOU WITHIN 7 DAYS OF YOUR PROCEDUE WITH YOUR RESULTS. Do not assume everything is normal if you have not heard from DR. Almeda Ezra IN ONE WEEK, CALL HER OFFICE AT [phone].  SEEK IMMEDIATE MEDICAL ATTENTION AND CALL THE OFFICE: [phone] IF:  You have more than a spotting of blood in your stool.   Your belly is swollen (abdominal distention).   You are nauseated or vomiting.   You have a temperature over 101F.   You have abdominal pain or discomfort that is severe or gets worse throughout the day.   Gastritis/DUODENITIS  Gastritis is an inflammation (the body's way of reacting to injury and/or infection) of the stomach. DUODENITIS is an inflammation (the body's way of reacting to injury and/or infection) of the FIRST PART OF THE SMALL INTESTINES. It is often caused by bacterial (germ) infections. It can also be caused BY  ASPIRIN, BC/GOODY POWDER'S, (IBUPROFEN) MOTRIN, OR ALEVE (NAPROXEN), chemicals (including alcohol), SPICY FOODS, and medications. This illness may be associated with generalized malaise (feeling tired, not well), UPPER ABDOMINAL STOMACH cramps, and fever. One common bacterial cause of gastritis is an organism known as H. Pylori. This can be treated with antibiotics.   High-Fiber Diet A high-fiber diet changes your normal diet to include more whole grains, legumes, fruits, and vegetables. Changes in the diet involve replacing refined carbohydrates with unrefined foods. The  calorie level of the diet is essentially unchanged. The Dietary Reference Intake (recommended amount) for adult males is 38 grams per day. For adult females, it is 25 grams per day. Pregnant and lactating women should consume 28 grams of fiber per day. Fiber is the intact part of a plant that is not broken down during digestion. Functional fiber is fiber that has been isolated from the plant to provide a beneficial effect in the body.  PURPOSE  Increase stool bulk.   Ease and regulate bowel movements.   Lower cholesterol.   REDUCE RISK OF COLON CANCER  INDICATIONS THAT YOU NEED MORE FIBER  Constipation and hemorrhoids.   Uncomplicated diverticulosis (intestine condition) and irritable bowel syndrome.   Weight management.   As a protective measure against hardening of the arteries (atherosclerosis), diabetes, and cancer.   GUIDELINES FOR INCREASING FIBER IN THE DIET  Start adding fiber to the diet slowly. A gradual increase of about 5 more grams (2 servings of most fruits or vegetables) per day is best. Too rapid an increase in fiber may result in constipation, flatulence, and bloating.   Drink enough water and fluids to keep your urine clear or pale yellow. Water, juice, or caffeine-free drinks are recommended. Not drinking enough fluid may cause constipation.   Eat a variety of high-fiber foods rather than one type of fiber.   Try to increase your intake of fiber through using high-fiber foods rather than fiber pills or supplements that contain small amounts of fiber.   The goal is to change the types of food eaten. Do not supplement your present diet with high-fiber foods, but replace foods in your present diet.

## 2019-10-28 NOTE — Transfer of Care (Signed)
Immediate Anesthesia Transfer of Care Note  Patient: Luke Mueller  Procedure(s) Performed: COLONOSCOPY WITH PROPOFOL (N/A ) ESOPHAGOGASTRODUODENOSCOPY (EGD) WITH PROPOFOL (N/A ) BIOPSY  Patient Location: PACU  Anesthesia Type:General  Level of Consciousness: awake, alert  and oriented  Airway & Oxygen Therapy: Patient Spontanous Breathing  Post-op Assessment: Report given to RN, Post -op Vital signs reviewed and stable and Patient moving all extremities X 4  Post vital signs: Reviewed and stable  Last Vitals:  Vitals Value Taken Time  BP 153/83 10/28/19 1326  Temp    Pulse 90 10/28/19 1327  Resp 16 10/28/19 1327  SpO2 94 % 10/28/19 1327  Vitals shown include unvalidated device data.  Last Pain:  Vitals:   10/28/19 1228  TempSrc:   PainSc: 4       Patients Stated Pain Goal: 2 (13/64/38 3779)  Complications: No apparent anesthesia complications   Right AV fistula patent

## 2019-10-28 NOTE — Anesthesia Preprocedure Evaluation (Signed)
Anesthesia Evaluation  Patient identified by MRN, date of birth, ID band Patient awake    Reviewed: Allergy & Precautions, H&P , NPO status , Patient's Chart, lab work & pertinent test results, reviewed documented beta blocker date and time   Airway Mallampati: II  TM Distance: >3 FB Neck ROM: full    Dental no notable dental hx.    Pulmonary pneumonia, resolved, COPD, Current Smoker,    Pulmonary exam normal breath sounds clear to auscultation       Cardiovascular Exercise Tolerance: Good hypertension, + Peripheral Vascular Disease   Rhythm:regular Rate:Normal     Neuro/Psych negative neurological ROS  negative psych ROS   GI/Hepatic Neg liver ROS, GERD  Medicated,  Endo/Other  negative endocrine ROS  Renal/GU ESRF and DialysisRenal disease  negative genitourinary   Musculoskeletal   Abdominal   Peds  Hematology  (+) Blood dyscrasia, anemia ,   Anesthesia Other Findings   Reproductive/Obstetrics negative OB ROS                             Anesthesia Physical Anesthesia Plan  ASA: II  Anesthesia Plan: General   Post-op Pain Management:    Induction:   PONV Risk Score and Plan: 1 and Propofol infusion  Airway Management Planned:   Additional Equipment:   Intra-op Plan:   Post-operative Plan:   Informed Consent: I have reviewed the patients History and Physical, chart, labs and discussed the procedure including the risks, benefits and alternatives for the proposed anesthesia with the patient or authorized representative who has indicated his/her understanding and acceptance.     Dental Advisory Given  Plan Discussed with: CRNA  Anesthesia Plan Comments:         Anesthesia Quick Evaluation

## 2019-10-28 NOTE — Telephone Encounter (Signed)
[facility] WANTS PATIENT TO HAVE CT WITHIN THE NEXT 7 DAYS
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
## 2019-10-28 NOTE — Op Note (Signed)
[facility] Patient Name: Luke Mueller Procedure Date: 10/28/2019 1:05 PM MRN: [id number] Date of Birth: 1961/02/14 Attending MD: Barney Drain MD, MD CSN: [id number] Age: 59 Admit Type: Outpatient Procedure:                Upper GI endoscopy WITH COLD FORCEPS BIOPSY Indications:              Anemia, Weight loss Providers:                Barney Drain MD, MD, Gwenlyn Fudge RN, RN, Randa Spike, Technician Referring MD:             Rosita Fire MD, MD Medicines:                Propofol per Anesthesia Complications:            No immediate complications. Estimated Blood Loss:     Estimated blood loss was minimal. Procedure:                Pre-Anesthesia Assessment:                           - Prior to the procedure, a History and Physical                            was performed, and patient medications and                            allergies were reviewed. The patient's tolerance of                            previous anesthesia was also reviewed. The risks                            and benefits of the procedure and the sedation                            options and risks were discussed with the patient.                            All questions were answered, and informed consent                            was obtained. Prior Anticoagulants: The patient                            last took heparin 1 day prior to the procedure. ASA                            Grade Assessment: III - A patient with severe                            systemic disease. After reviewing the risks and  benefits, the patient was deemed in satisfactory                            condition to undergo the procedure. After obtaining                            informed consent, the endoscope was passed under                            direct vision. Throughout the procedure, the                            patient's blood pressure, pulse, and oxygen                     saturations were monitored continuously. The                            [id number]-[id number] ([id number]) scope was introduced through the                            mouth, and advanced to the second part of duodenum.                            The upper GI endoscopy was accomplished without                            difficulty. The patient tolerated the procedure                            fairly well. Scope In: 1:07:35 PM Scope Out: 1:19:12 PM Total Procedure Duration: 0 hours 11 minutes 37 seconds  Findings:      A low-grade of narrowing Schatzki ring was found at the gastroesophageal       junction.      A medium-sized hiatal hernia was present.      Mild portal hypertensive gastropathy was found in the gastric fundus and       in the gastric body.      Localized mild inflammation characterized by congestion (edema) and       erythema was found in the gastric antrum.       Biopsies(2:BODY,1:INCISURA,2:ANTRUM were taken with a cold forceps for       Helicobacter pylori OR ATROPHIC GASTRITIS.      Localized mild inflammation characterized by congestion (edema),       erosions and erythema was found in the duodenal bulb. Biopsies(2) for       histology were taken with a cold forceps for evaluation of celiac       disease.      The second portion of the duodenum was normal. Biopsies(4) for histology       were taken with a cold forceps for evaluation of celiac disease. Impression:               - Low-grade of narrowing Schatzki ring.                           - Medium-sized hiatal hernia.                           -  MILD Portal hypertensive gastropathy.                           - MILD Gastritis/Duodenitis. Biopsied.                           - ANEMIA MAY BE DUE TO FRIABLE                            GASTRIC/DUODENAL/COLONIC MUCOSA Moderate Sedation:      Per Anesthesia Care Recommendation:           - Patient has a contact number available for                             emergencies. The signs and symptoms of potential                            delayed complications were discussed with the                            patient. Return to normal activities tomorrow.                            Written discharge instructions were provided to the                            patient.                           - Resume previous diet.                           - Continue present medications.                           - Await pathology results. NEEDS TSH TODAY AND CT                            ABD/PELVIS WITHIN 7-10 DAYS.                           - Return to GI office in 3 months. Procedure Code(s):        --- Professional ---                           [phone], Esophagogastroduodenoscopy, flexible,                            transoral; with biopsy, single or multiple Diagnosis Code(s):        --- Professional ---                           K22.2, Esophageal obstruction                           K44.9, Diaphragmatic hernia without obstruction or  gangrene                           K76.6, Portal hypertension                           K31.89, Other diseases of stomach and duodenum                           K29.70, Gastritis, unspecified, without bleeding                           K29.80, Duodenitis without bleeding                           D64.9, Anemia, unspecified                           R63.4, Abnormal weight loss CPT copyright 2019 American [employer] Association. All rights reserved. The codes documented in this report are preliminary and upon coder review may  be revised to meet current compliance requirements. Barney Drain, MD Barney Drain MD, MD 10/28/2019 1:43:07 PM This report has been signed electronically. Number of Addenda: 0

## 2019-10-28 NOTE — Telephone Encounter (Signed)
Pt's mother (listed on DPR) called office, informed her of CT appt. Letter mailed.

## 2019-10-28 NOTE — Telephone Encounter (Signed)
CT abd/pelvis scheduled for 11/04/19 at 3:30pm, arrive at 3:00pm. NPO 4 hours prior to test. Pickup contrast prior to test.  Tried to call pt, no answer, LMOAM for return call.

## 2019-10-28 NOTE — Addendum Note (Signed)
Addended by: Hassan Rowan on: 10/28/2019 02:46 PM   Modules accepted: Orders

## 2019-10-29 ENCOUNTER — Other Ambulatory Visit: Payer: Self-pay

## 2019-10-29 ENCOUNTER — Telehealth: Payer: Self-pay | Admitting: Gastroenterology

## 2019-10-29 DIAGNOSIS — D509 Iron deficiency anemia, unspecified: Secondary | ICD-10-CM | POA: Diagnosis not present

## 2019-10-29 DIAGNOSIS — Z992 Dependence on renal dialysis: Secondary | ICD-10-CM | POA: Diagnosis not present

## 2019-10-29 DIAGNOSIS — N186 End stage renal disease: Secondary | ICD-10-CM | POA: Diagnosis not present

## 2019-10-29 DIAGNOSIS — D631 Anemia in chronic kidney disease: Secondary | ICD-10-CM | POA: Diagnosis not present

## 2019-10-29 DIAGNOSIS — N2581 Secondary hyperparathyroidism of renal origin: Secondary | ICD-10-CM | POA: Diagnosis not present

## 2019-10-29 LAB — SURGICAL PATHOLOGY

## 2019-10-29 NOTE — Telephone Encounter (Signed)
Please call pt. His stomach Bx shows mild gastritis.  HIS SMALL BOWEL BIOPSIES ARE NORMAL. YOUR THYROID TEST IS NORMAL.   Complete CT ABDOMEN AND PELVIS WITHIN 7-10 DAYS TO COMPLETE YOUR EVALUATION FOR WEIGHT LOSS. FOLLOW UP IN 4 MOS. YOUR BOWEL PREP WAS NOT ADEQUATE TO DETECT SMALL POLYPS. YOU SHOULD HAVE A REPEAT COLONOSCOPY WITHIN THE NEXT YEAR.

## 2019-10-29 NOTE — Telephone Encounter (Signed)
Patient already scheduled for CT 11/04/2019 from OV

## 2019-10-29 NOTE — Telephone Encounter (Signed)
Pt came by office, informed of CT appt. Letter mailed.

## 2019-10-30 NOTE — Telephone Encounter (Signed)
Called lmom

## 2019-10-30 NOTE — Telephone Encounter (Signed)
OV made and reminder in epic °

## 2019-10-31 ENCOUNTER — Telehealth: Payer: Self-pay | Admitting: Emergency Medicine

## 2019-10-31 DIAGNOSIS — N2581 Secondary hyperparathyroidism of renal origin: Secondary | ICD-10-CM | POA: Diagnosis not present

## 2019-10-31 DIAGNOSIS — Z992 Dependence on renal dialysis: Secondary | ICD-10-CM | POA: Diagnosis not present

## 2019-10-31 DIAGNOSIS — D509 Iron deficiency anemia, unspecified: Secondary | ICD-10-CM | POA: Diagnosis not present

## 2019-10-31 DIAGNOSIS — N186 End stage renal disease: Secondary | ICD-10-CM | POA: Diagnosis not present

## 2019-10-31 DIAGNOSIS — D631 Anemia in chronic kidney disease: Secondary | ICD-10-CM | POA: Diagnosis not present

## 2019-10-31 NOTE — Telephone Encounter (Signed)
Called lmom

## 2019-10-31 NOTE — Telephone Encounter (Signed)
Went over results with pt, notified ct on 4/27 and f/u apt in aug

## 2019-10-31 NOTE — Telephone Encounter (Signed)
Pt called verified name and dob pt made aware of results, ct on 4/27 and apt in aug stated he understood and thanked me for the call

## 2019-11-03 DIAGNOSIS — Z992 Dependence on renal dialysis: Secondary | ICD-10-CM | POA: Diagnosis not present

## 2019-11-03 DIAGNOSIS — N186 End stage renal disease: Secondary | ICD-10-CM | POA: Diagnosis not present

## 2019-11-03 DIAGNOSIS — D509 Iron deficiency anemia, unspecified: Secondary | ICD-10-CM | POA: Diagnosis not present

## 2019-11-03 DIAGNOSIS — D631 Anemia in chronic kidney disease: Secondary | ICD-10-CM | POA: Diagnosis not present

## 2019-11-03 DIAGNOSIS — N2581 Secondary hyperparathyroidism of renal origin: Secondary | ICD-10-CM | POA: Diagnosis not present

## 2019-11-04 ENCOUNTER — Ambulatory Visit (HOSPITAL_COMMUNITY)
Admission: RE | Admit: 2019-11-04 | Discharge: 2019-11-04 | Disposition: A | Discharge status: Home / Self Care | Payer: Medicare Other | Source: Ambulatory Visit | Attending: Gastroenterology | Admitting: Gastroenterology

## 2019-11-04 ENCOUNTER — Other Ambulatory Visit: Payer: Self-pay

## 2019-11-04 DIAGNOSIS — R634 Abnormal weight loss: Secondary | ICD-10-CM | POA: Insufficient documentation

## 2019-11-04 MED ORDER — IOHEXOL 300 MG/ML  SOLN
100.0000 mL | Freq: Once | INTRAMUSCULAR | Status: AC | PRN
  Administered 2019-11-04: 100 mL via INTRAVENOUS

## 2019-11-05 DIAGNOSIS — D631 Anemia in chronic kidney disease: Secondary | ICD-10-CM | POA: Diagnosis not present

## 2019-11-05 DIAGNOSIS — N2581 Secondary hyperparathyroidism of renal origin: Secondary | ICD-10-CM | POA: Diagnosis not present

## 2019-11-05 DIAGNOSIS — N186 End stage renal disease: Secondary | ICD-10-CM | POA: Diagnosis not present

## 2019-11-05 DIAGNOSIS — D509 Iron deficiency anemia, unspecified: Secondary | ICD-10-CM | POA: Diagnosis not present

## 2019-11-05 DIAGNOSIS — Z992 Dependence on renal dialysis: Secondary | ICD-10-CM | POA: Diagnosis not present

## 2019-11-06 ENCOUNTER — Telehealth: Payer: Self-pay | Admitting: Internal Medicine

## 2019-11-06 NOTE — Telephone Encounter (Signed)
Spoke with pt's mother, [place]. Advised her that we do not do reminder letters but the pt will get a call to remind him of the appointment. Nothing further was needed.

## 2019-11-07 ENCOUNTER — Telehealth: Payer: Self-pay | Admitting: Gastroenterology

## 2019-11-07 DIAGNOSIS — D509 Iron deficiency anemia, unspecified: Secondary | ICD-10-CM | POA: Diagnosis not present

## 2019-11-07 DIAGNOSIS — Z992 Dependence on renal dialysis: Secondary | ICD-10-CM | POA: Diagnosis not present

## 2019-11-07 DIAGNOSIS — N186 End stage renal disease: Secondary | ICD-10-CM | POA: Diagnosis not present

## 2019-11-07 DIAGNOSIS — N2581 Secondary hyperparathyroidism of renal origin: Secondary | ICD-10-CM | POA: Diagnosis not present

## 2019-11-07 DIAGNOSIS — D631 Anemia in chronic kidney disease: Secondary | ICD-10-CM | POA: Diagnosis not present

## 2019-11-07 NOTE — Telephone Encounter (Signed)
PLEASE CALL PT. HIS CT SHOWS POSSIBLE CIRRHOSIS OF THE LIVER. HE SHOULD STOP DRINKING ALCOHOL. NO SOURCE FOR HIS WEIGHT LOSS WAS IDENTIFIED. HE SHOULD SEE DR. FANTA IF HE CONTINUES TO LOSE WEIGHT.  FOLLOW UP IN 6 MOS.

## 2019-11-10 DIAGNOSIS — N250 Renal osteodystrophy: Secondary | ICD-10-CM | POA: Diagnosis not present

## 2019-11-10 DIAGNOSIS — Z992 Dependence on renal dialysis: Secondary | ICD-10-CM | POA: Diagnosis not present

## 2019-11-10 DIAGNOSIS — D631 Anemia in chronic kidney disease: Secondary | ICD-10-CM | POA: Diagnosis not present

## 2019-11-10 DIAGNOSIS — N2581 Secondary hyperparathyroidism of renal origin: Secondary | ICD-10-CM | POA: Diagnosis not present

## 2019-11-10 DIAGNOSIS — D509 Iron deficiency anemia, unspecified: Secondary | ICD-10-CM | POA: Diagnosis not present

## 2019-11-10 DIAGNOSIS — N186 End stage renal disease: Secondary | ICD-10-CM | POA: Diagnosis not present

## 2019-11-10 NOTE — Telephone Encounter (Signed)
Notified pt of results 

## 2019-11-10 NOTE — Telephone Encounter (Signed)
Called lmom

## 2019-11-10 NOTE — Telephone Encounter (Signed)
Pt walked in the office requesting results pulled him into room 1 to provide him privacy and protect hippa

## 2019-11-10 NOTE — Telephone Encounter (Signed)
Patient scheduled for follow up.

## 2019-11-12 DIAGNOSIS — D509 Iron deficiency anemia, unspecified: Secondary | ICD-10-CM | POA: Diagnosis not present

## 2019-11-12 DIAGNOSIS — N250 Renal osteodystrophy: Secondary | ICD-10-CM | POA: Diagnosis not present

## 2019-11-12 DIAGNOSIS — Z992 Dependence on renal dialysis: Secondary | ICD-10-CM | POA: Diagnosis not present

## 2019-11-12 DIAGNOSIS — N2581 Secondary hyperparathyroidism of renal origin: Secondary | ICD-10-CM | POA: Diagnosis not present

## 2019-11-12 DIAGNOSIS — N186 End stage renal disease: Secondary | ICD-10-CM | POA: Diagnosis not present

## 2019-11-12 DIAGNOSIS — D631 Anemia in chronic kidney disease: Secondary | ICD-10-CM | POA: Diagnosis not present

## 2019-11-14 DIAGNOSIS — N250 Renal osteodystrophy: Secondary | ICD-10-CM | POA: Diagnosis not present

## 2019-11-14 DIAGNOSIS — N2581 Secondary hyperparathyroidism of renal origin: Secondary | ICD-10-CM | POA: Diagnosis not present

## 2019-11-14 DIAGNOSIS — Z992 Dependence on renal dialysis: Secondary | ICD-10-CM | POA: Diagnosis not present

## 2019-11-14 DIAGNOSIS — D509 Iron deficiency anemia, unspecified: Secondary | ICD-10-CM | POA: Diagnosis not present

## 2019-11-14 DIAGNOSIS — N186 End stage renal disease: Secondary | ICD-10-CM | POA: Diagnosis not present

## 2019-11-14 DIAGNOSIS — D631 Anemia in chronic kidney disease: Secondary | ICD-10-CM | POA: Diagnosis not present

## 2019-11-17 DIAGNOSIS — D631 Anemia in chronic kidney disease: Secondary | ICD-10-CM | POA: Diagnosis not present

## 2019-11-17 DIAGNOSIS — D509 Iron deficiency anemia, unspecified: Secondary | ICD-10-CM | POA: Diagnosis not present

## 2019-11-17 DIAGNOSIS — N250 Renal osteodystrophy: Secondary | ICD-10-CM | POA: Diagnosis not present

## 2019-11-17 DIAGNOSIS — N2581 Secondary hyperparathyroidism of renal origin: Secondary | ICD-10-CM | POA: Diagnosis not present

## 2019-11-17 DIAGNOSIS — N186 End stage renal disease: Secondary | ICD-10-CM | POA: Diagnosis not present

## 2019-11-17 DIAGNOSIS — Z992 Dependence on renal dialysis: Secondary | ICD-10-CM | POA: Diagnosis not present

## 2019-11-19 DIAGNOSIS — Z992 Dependence on renal dialysis: Secondary | ICD-10-CM | POA: Diagnosis not present

## 2019-11-19 DIAGNOSIS — N2581 Secondary hyperparathyroidism of renal origin: Secondary | ICD-10-CM | POA: Diagnosis not present

## 2019-11-19 DIAGNOSIS — D631 Anemia in chronic kidney disease: Secondary | ICD-10-CM | POA: Diagnosis not present

## 2019-11-19 DIAGNOSIS — N186 End stage renal disease: Secondary | ICD-10-CM | POA: Diagnosis not present

## 2019-11-19 DIAGNOSIS — N250 Renal osteodystrophy: Secondary | ICD-10-CM | POA: Diagnosis not present

## 2019-11-19 DIAGNOSIS — D509 Iron deficiency anemia, unspecified: Secondary | ICD-10-CM | POA: Diagnosis not present

## 2019-11-21 DIAGNOSIS — N2581 Secondary hyperparathyroidism of renal origin: Secondary | ICD-10-CM | POA: Diagnosis not present

## 2019-11-21 DIAGNOSIS — D509 Iron deficiency anemia, unspecified: Secondary | ICD-10-CM | POA: Diagnosis not present

## 2019-11-21 DIAGNOSIS — Z992 Dependence on renal dialysis: Secondary | ICD-10-CM | POA: Diagnosis not present

## 2019-11-21 DIAGNOSIS — D631 Anemia in chronic kidney disease: Secondary | ICD-10-CM | POA: Diagnosis not present

## 2019-11-21 DIAGNOSIS — N250 Renal osteodystrophy: Secondary | ICD-10-CM | POA: Diagnosis not present

## 2019-11-21 DIAGNOSIS — N186 End stage renal disease: Secondary | ICD-10-CM | POA: Diagnosis not present

## 2019-11-24 DIAGNOSIS — N186 End stage renal disease: Secondary | ICD-10-CM | POA: Diagnosis not present

## 2019-11-24 DIAGNOSIS — N250 Renal osteodystrophy: Secondary | ICD-10-CM | POA: Diagnosis not present

## 2019-11-24 DIAGNOSIS — D509 Iron deficiency anemia, unspecified: Secondary | ICD-10-CM | POA: Diagnosis not present

## 2019-11-24 DIAGNOSIS — Z992 Dependence on renal dialysis: Secondary | ICD-10-CM | POA: Diagnosis not present

## 2019-11-24 DIAGNOSIS — D631 Anemia in chronic kidney disease: Secondary | ICD-10-CM | POA: Diagnosis not present

## 2019-11-24 DIAGNOSIS — N2581 Secondary hyperparathyroidism of renal origin: Secondary | ICD-10-CM | POA: Diagnosis not present

## 2019-11-26 DIAGNOSIS — D509 Iron deficiency anemia, unspecified: Secondary | ICD-10-CM | POA: Diagnosis not present

## 2019-11-26 DIAGNOSIS — N186 End stage renal disease: Secondary | ICD-10-CM | POA: Diagnosis not present

## 2019-11-26 DIAGNOSIS — N250 Renal osteodystrophy: Secondary | ICD-10-CM | POA: Diagnosis not present

## 2019-11-26 DIAGNOSIS — Z992 Dependence on renal dialysis: Secondary | ICD-10-CM | POA: Diagnosis not present

## 2019-11-26 DIAGNOSIS — D631 Anemia in chronic kidney disease: Secondary | ICD-10-CM | POA: Diagnosis not present

## 2019-11-26 DIAGNOSIS — N2581 Secondary hyperparathyroidism of renal origin: Secondary | ICD-10-CM | POA: Diagnosis not present

## 2019-11-28 DIAGNOSIS — N186 End stage renal disease: Secondary | ICD-10-CM | POA: Diagnosis not present

## 2019-11-28 DIAGNOSIS — N250 Renal osteodystrophy: Secondary | ICD-10-CM | POA: Diagnosis not present

## 2019-11-28 DIAGNOSIS — D631 Anemia in chronic kidney disease: Secondary | ICD-10-CM | POA: Diagnosis not present

## 2019-11-28 DIAGNOSIS — N2581 Secondary hyperparathyroidism of renal origin: Secondary | ICD-10-CM | POA: Diagnosis not present

## 2019-11-28 DIAGNOSIS — Z992 Dependence on renal dialysis: Secondary | ICD-10-CM | POA: Diagnosis not present

## 2019-11-28 DIAGNOSIS — D509 Iron deficiency anemia, unspecified: Secondary | ICD-10-CM | POA: Diagnosis not present

## 2019-12-01 DIAGNOSIS — Z992 Dependence on renal dialysis: Secondary | ICD-10-CM | POA: Diagnosis not present

## 2019-12-01 DIAGNOSIS — N2581 Secondary hyperparathyroidism of renal origin: Secondary | ICD-10-CM | POA: Diagnosis not present

## 2019-12-01 DIAGNOSIS — D509 Iron deficiency anemia, unspecified: Secondary | ICD-10-CM | POA: Diagnosis not present

## 2019-12-01 DIAGNOSIS — N250 Renal osteodystrophy: Secondary | ICD-10-CM | POA: Diagnosis not present

## 2019-12-01 DIAGNOSIS — D631 Anemia in chronic kidney disease: Secondary | ICD-10-CM | POA: Diagnosis not present

## 2019-12-01 DIAGNOSIS — N186 End stage renal disease: Secondary | ICD-10-CM | POA: Diagnosis not present

## 2019-12-03 DIAGNOSIS — N250 Renal osteodystrophy: Secondary | ICD-10-CM | POA: Diagnosis not present

## 2019-12-03 DIAGNOSIS — N186 End stage renal disease: Secondary | ICD-10-CM | POA: Diagnosis not present

## 2019-12-03 DIAGNOSIS — D509 Iron deficiency anemia, unspecified: Secondary | ICD-10-CM | POA: Diagnosis not present

## 2019-12-03 DIAGNOSIS — N2581 Secondary hyperparathyroidism of renal origin: Secondary | ICD-10-CM | POA: Diagnosis not present

## 2019-12-03 DIAGNOSIS — D631 Anemia in chronic kidney disease: Secondary | ICD-10-CM | POA: Diagnosis not present

## 2019-12-03 DIAGNOSIS — Z992 Dependence on renal dialysis: Secondary | ICD-10-CM | POA: Diagnosis not present

## 2019-12-05 DIAGNOSIS — D509 Iron deficiency anemia, unspecified: Secondary | ICD-10-CM | POA: Diagnosis not present

## 2019-12-05 DIAGNOSIS — D631 Anemia in chronic kidney disease: Secondary | ICD-10-CM | POA: Diagnosis not present

## 2019-12-05 DIAGNOSIS — N2581 Secondary hyperparathyroidism of renal origin: Secondary | ICD-10-CM | POA: Diagnosis not present

## 2019-12-05 DIAGNOSIS — N186 End stage renal disease: Secondary | ICD-10-CM | POA: Diagnosis not present

## 2019-12-05 DIAGNOSIS — Z992 Dependence on renal dialysis: Secondary | ICD-10-CM | POA: Diagnosis not present

## 2019-12-05 DIAGNOSIS — N250 Renal osteodystrophy: Secondary | ICD-10-CM | POA: Diagnosis not present

## 2019-12-08 DIAGNOSIS — N2581 Secondary hyperparathyroidism of renal origin: Secondary | ICD-10-CM | POA: Diagnosis not present

## 2019-12-08 DIAGNOSIS — N250 Renal osteodystrophy: Secondary | ICD-10-CM | POA: Diagnosis not present

## 2019-12-08 DIAGNOSIS — D631 Anemia in chronic kidney disease: Secondary | ICD-10-CM | POA: Diagnosis not present

## 2019-12-08 DIAGNOSIS — Z992 Dependence on renal dialysis: Secondary | ICD-10-CM | POA: Diagnosis not present

## 2019-12-08 DIAGNOSIS — D509 Iron deficiency anemia, unspecified: Secondary | ICD-10-CM | POA: Diagnosis not present

## 2019-12-08 DIAGNOSIS — N186 End stage renal disease: Secondary | ICD-10-CM | POA: Diagnosis not present

## 2019-12-11 DIAGNOSIS — I739 Peripheral vascular disease, unspecified: Secondary | ICD-10-CM | POA: Diagnosis not present

## 2019-12-11 DIAGNOSIS — F17210 Nicotine dependence, cigarettes, uncomplicated: Secondary | ICD-10-CM | POA: Diagnosis not present

## 2019-12-11 DIAGNOSIS — J449 Chronic obstructive pulmonary disease, unspecified: Secondary | ICD-10-CM | POA: Diagnosis not present

## 2019-12-11 DIAGNOSIS — E43 Unspecified severe protein-calorie malnutrition: Secondary | ICD-10-CM | POA: Diagnosis not present

## 2019-12-11 DIAGNOSIS — N186 End stage renal disease: Secondary | ICD-10-CM | POA: Diagnosis not present

## 2019-12-22 DIAGNOSIS — Z992 Dependence on renal dialysis: Secondary | ICD-10-CM | POA: Diagnosis not present

## 2019-12-23 ENCOUNTER — Encounter: Payer: Self-pay | Admitting: Internal Medicine

## 2019-12-23 ENCOUNTER — Ambulatory Visit (INDEPENDENT_AMBULATORY_CARE_PROVIDER_SITE_OTHER): Payer: Medicare Other | Admitting: Internal Medicine

## 2019-12-23 ENCOUNTER — Ambulatory Visit (HOSPITAL_COMMUNITY)
Admission: RE | Admit: 2019-12-23 | Discharge: 2019-12-23 | Disposition: A | Discharge status: Home / Self Care | Payer: Medicare Other | Source: Ambulatory Visit | Attending: Internal Medicine | Admitting: Internal Medicine

## 2019-12-23 ENCOUNTER — Other Ambulatory Visit: Payer: Self-pay

## 2019-12-23 DIAGNOSIS — F17210 Nicotine dependence, cigarettes, uncomplicated: Secondary | ICD-10-CM

## 2019-12-23 DIAGNOSIS — J449 Chronic obstructive pulmonary disease, unspecified: Secondary | ICD-10-CM | POA: Diagnosis not present

## 2019-12-23 DIAGNOSIS — J439 Emphysema, unspecified: Secondary | ICD-10-CM | POA: Diagnosis not present

## 2019-12-23 MED ORDER — OMEPRAZOLE 40 MG PO CPDR: DELAYED_RELEASE_CAPSULE | ORAL | 11 refills | Status: AC

## 2019-12-23 NOTE — Assessment & Plan Note (Addendum)
Active smoker  His exam is clear now and I suspect he has more of an AB picture ? With component of cardiac asthma and symb 160 2bid fine for now pending f/u with pfts.   - The proper method of use, as well as anticipated side effects, of a metered-dose inhaler are discussed and demonstrated to the patient.     I spent extra time with pt today reviewing appropriate use of albuterol for prn use on exertion with the following points: 1) saba is for relief of sob that does not improve by walking a slower pace or resting but rather if the pt does not improve after trying this first. 2) If the pt is convinced, as many are, that saba helps recover from activity faster then it's easy to tell if this is the case by re-challenging : ie stop, take the inhaler, then p 5 minutes try the exact same activity (intensity of workload) that just caused the symptoms and see if they are substantially diminished or not after saba 3) if there is an activity that reproducibly causes the symptoms, try the saba 15 min before the activity on alternate days   If in fact the saba really does help, then fine to continue to use it prn but advised may need to look closer at the maintenance regimen being used to achieve better control of airways disease with exertion.

## 2019-12-23 NOTE — Progress Notes (Addendum)
Achilles Dunk, male    DOB: 1961/01/01, 59 y.o.   MRN: [id number]   Brief patient profile:  27 yobm still smoking  ESRF on HD x around 2016 MWF     Admission date:  09/21/2019   Discharge Date:  09/23/2019    Discharge Diagnosis  Uremia [N19] Uremia of renal origin [N19]     End stage renal disease (HCC)   Tobacco abuse   Generalized weakness   HBP (high blood pressure)   Uremia of renal origin  Hx: 59 y.o.malewith medical history significant ofhypertension OPD, ESRD on hemodialysis Monday Wednesday Friday peripheral vascular disease presented to ED with complaints of generalized weakness secondary to poor oral intake and missing multiple sessions of dialysis. Patient states that he was always compliant with his dialysis but missed the dialysis sessions. Patient further mentioned that he went to his dialysis center but they advised him to go to emergency department for dialysis and they will accept him back otherwise he cannot get outpatient dialysis. Patient denies any fever, chills, chest pain, shortness of breath, nausea, vomiting, abdominal pain and swelling of bilateral lower extremities  ED Course:On arrival to the hospital patient had blood pressure of 147/70, heart rate 71, respiratory rate 13 and oxygen saturation 96% on room air. Blood work showed hemoglobin 12.9, BUN 125 and creatinine greater than 18. Chest x-ray showed cardiomegaly but no airspace disease. ED physician contacted Dr.Schertz/nephrology who recommended to admit the patient to the hospital and he will assist with dialysis in the morning.   A/p 1)ESRD--presented with uremia after multiple missed HD sessions -Had persistent emesis resolved p HD  -Prior to admission he waslast HD session was on 09/10/2019    2)HTN--okay to restart amlodipine at 5 mg (prior history of lower extremity edema with higher doses of amlodipine), restart hydralazine, restart torsemide, resume Coreg at a lower dose of 6.25  mg twice daily  3)COPD--no acute exacerbation, continue bronchodilators  4)tobacco abuse---cessation advised, use nicotine patch  5)PAD-continue Pletal      History of Present Illness  12/23/2019  Pulmonary/ 1st office eval/[facility]  Chief Complaint  Patient presents with  . Pulmonary Consult    Former Dr Luan Pulling pt. Pt states his breathing has been stable since last visit with [facility]. He has occ cough with clear sputum. He uses his albuterol inhaler about once per day.   Dyspnea:  Legs give out first  Cough: comes and goes with st x 48 some more white mucus and overt HB Sleep: bothered by HB despite ppi hs  SABA use: not sure what he has or how to use it/ has symbicort   No obvious day to day or daytime variability or assoc excess/ purulent sputum or mucus plugs or hemoptysis or cp or chest tightness, subjective wheeze     Sleeping most nights without nocturnal  or early am exacerbation  of respiratory  c/o's or need for noct saba. Also denies any obvious fluctuation of symptoms with weather or environmental changes or other aggravating or alleviating factors except as outlined above   No unusual exposure hx or h/o childhood pna/ asthma or knowledge of premature birth.  Current Allergies, Complete Past Medical History, Past Surgical History, Family History, and Social History were reviewed in [employer] record.  ROS  The following are not active complaints unless bolded Hoarseness, sore throat, dysphagia, dental problems, itching, sneezing,  nasal congestion or discharge of excess mucus or purulent secretions, ear ache,   fever, chills,  sweats, unintended wt loss or wt gain, classically pleuritic or exertional cp,  orthopnea pnd or arm/hand swelling  or leg swelling, presyncope, palpitations, abdominal pain, anorexia, nausea, vomiting, diarrhea  or change in bowel habits or change in bladder habits, change in stools or change in urine, dysuria, hematuria,   rash, arthralgias, visual complaints, headache, numbness, weakness or ataxia or problems with walking or coordination,  change in mood or  memory.           Past Medical History:  Diagnosis Date  . Chronic kidney disease   . COPD (chronic obstructive pulmonary disease) ([place])   . Dialysis patient ([place])   . ESRD (end stage renal disease) on dialysis ([place])   . GERD (gastroesophageal reflux disease)   . Hypertension   . Irregular heartbeat   . Peripheral vascular disease ([place])   . Pneumonia 09/02/2018    Outpatient Medications Prior to Visit  Medication Sig Dispense Refill  . acetaminophen (TYLENOL) 325 MG tablet Take 2 tablets (650 mg total) by mouth every 6 (six) hours as needed for mild pain, fever or headache (or Fever >/= 101). 12 tablet 0  . albuterol (VENTOLIN HFA) 108 (90 Base) MCG/ACT inhaler Inhale 2 puffs into the lungs every 6 (six) hours as needed for wheezing or shortness of breath. 18 g 2  . amLODipine (NORVASC) 5 MG tablet Take 1 tablet (5 mg total) by mouth daily. 30 tablet 2  . budesonide-formoterol (SYMBICORT) 160-4.5 MCG/ACT inhaler Inhale 2 puffs into the lungs 2 (two) times daily. 1 Inhaler 12  . carvedilol (COREG) 6.25 MG tablet Take 1 tablet (6.25 mg total) by mouth 2 (two) times daily with a meal. 60 tablet 3  . cilostazol (PLETAL) 100 MG tablet Take 1 tablet (100 mg total) by mouth 2 (two) times daily. 60 tablet 3  . cyclobenzaprine (FLEXERIL) 10 MG tablet Take 10 mg by mouth 2 (two) times daily.     . hydrALAZINE (APRESOLINE) 50 MG tablet Take 1 tablet (50 mg total) by mouth 3 (three) times daily. 90 tablet 4  . loperamide (IMODIUM A-D) 2 MG tablet Take 1 tablet (2 mg total) by mouth 4 (four) times daily as needed for diarrhea or loose stools. 30 tablet 0  . multivitamin (RENA-VIT) TABS tablet Take 1 tablet by mouth daily.    Marland Kitchen omeprazole (PRILOSEC) 40 MG capsule Take 40 mg by mouth daily.     . ondansetron (ZOFRAN ODT) 4 MG disintegrating tablet Take 1 tablet (4  mg total) by mouth every 8 (eight) hours as needed for nausea or vomiting. 20 tablet 0  . RESTASIS 0.05 % ophthalmic emulsion Place 1 drop into both eyes 2 (two) times daily.    . sevelamer carbonate (RENVELA) 800 MG tablet 3 tablet each meal and 1 tablet with snack    . torsemide (DEMADEX) 100 MG tablet Take 100 mg by mouth daily.    . traMADol (ULTRAM) 50 MG tablet Take 1 tablet by mouth 2 (two) times daily as needed for moderate pain.      No facility-administered medications prior to visit.     Objective:     BP (!) 170/80 (BP Location: Left Arm, Cuff Size: Normal)   Pulse 78   Temp (!) 97.2 F (36.2 C) (Temporal)   Ht 5\' 10"  (1.778 m)   Wt 120 lb (54.4 kg)   SpO2 98% Comment: on RA  BMI 17.22 kg/m   SpO2: 98 % (on RA)   amb somber bm very  easily confused with details of care, names of meds  HEENT : pt wearing mask not removed for exam due to covid - 19 concerns.    NECK :  without JVD/Nodes/TM/ nl carotid upstrokes bilaterally   LUNGS: no acc muscle use,  Mild barrel  contour chest wall with bilateral  Distant bs s audible wheeze and  without cough on insp or exp maneuvers  and mild  Hyperresonant  to  percussion bilaterally     CV:  RRR  no s3 or murmur or increase in P2, and no edema   ABD:  soft and nontender with pos end  insp Hoover's  in the supine position. No bruits or organomegaly appreciated, bowel sounds nl  MS:   Nl gait/  ext warm without deformities, calf tenderness, cyanosis or clubbing No obvious joint restrictions   SKIN: warm and dry without lesions    NEURO:  alert, approp, nl sensorium with  no motor or cerebellar deficits apparent.        CXR PA and Lateral:   12/23/2019 :    I personally reviewed images and   impression as follows:    severe copd / mild CM s chf     Assessment   COPD  GOLD ? / active smoker AB hx  Active smoker  His exam is clear now and I suspect he has more of an AB picture ? With component of cardiac asthma and  symb 160 2bid fine for now pending f/u with pfts.   - The proper method of use, as well as anticipated side effects, of a metered-dose inhaler are discussed and demonstrated to the patient.     I spent extra time with pt today reviewing appropriate use of albuterol for prn use on exertion with the following points: 1) saba is for relief of sob that does not improve by walking a slower pace or resting but rather if the pt does not improve after trying this first. 2) If the pt is convinced, as many are, that saba helps recover from activity faster then it's easy to tell if this is the case by re-challenging : ie stop, take the inhaler, then p 5 minutes try the exact same activity (intensity of workload) that just caused the symptoms and see if they are substantially diminished or not after saba 3) if there is an activity that reproducibly causes the symptoms, try the saba 15 min before the activity on alternate days   If in fact the saba really does help, then fine to continue to use it prn but advised may need to look closer at the maintenance regimen being used to achieve better control of airways disease with exertion.      Cigarette smoker Counseled re importance of smoking cessation but did not meet time criteria for separate billing       F/u in 6 weeks with all meds in hand using a trust but verify approach to confirm accurate Medication  Reconciliation The principal here is that until we are certain that the  patients are doing what we've asked, it makes no sense to ask them to do more.   Each maintenance medication was reviewed in detail including emphasizing most importantly the difference between maintenance and prns and under what circumstances the prns are to be triggered using an action plan format where appropriate.  Total time for H and P, chart review, counseling, teaching device and generating customized AVS unique to this office visit / charting =  60 min        Christinia Gully, MD 12/23/2019

## 2019-12-23 NOTE — Patient Instructions (Addendum)
Plan A = Automatic = Always=    Symbicort 160 Take 1- 2 puffs first thing in am and then another 1- 2 puffs about 12 hours later.    Work on inhaler technique:  relax and gently blow all the way out then take a nice smooth deep breath back in, triggering the inhaler at same time you start breathing in.  Hold for up to 5 seconds if you can. Blow symbicort out thru nose. Rinse and gargle with water when done   Plan B = Backup (to supplement plan A, not to replace it) Only use your albuterol inhaler as a rescue medication to be used if you can't catch your breath by resting or doing a relaxed purse lip breathing pattern.  - The less you use it, the better it will work when you need it. - Ok to use the inhaler up to 2 puffs  every 4 hours if you must but call for appointment if use goes up over your usual need - Don't leave home without it !!  (think of it like the spare tire for your car)     Omeprazole 40 mg Take 30- 60 min before your first and last meals of the day until heartburn  Please remember to go to the  x-ray department at hospital  for your tests - we will call you with the results when they are available      Please schedule a follow up office visit in 6 weeks, call sooner if needed with pfts on return - Bring all your medicatoins and inhalers with you to your next visit

## 2019-12-23 NOTE — Assessment & Plan Note (Addendum)
Counseled re importance of smoking cessation but did not meet time criteria for separate billing     >> f/u in 6 weeks with all meds in hand using a trust but verify approach to confirm accurate Medication  Reconciliation The principal here is that until we are certain that the  patients are doing what we've asked, it makes no sense to ask them to do more.      Each maintenance medication was reviewed in detail including emphasizing most importantly the difference between maintenance and prns and under what circumstances the prns are to be triggered using an action plan format where appropriate.  Total time for H and P, chart review, counseling, teaching device and generating customized AVS unique to this office visit / charting = 60 min

## 2019-12-24 NOTE — Progress Notes (Signed)
LMTCB

## 2019-12-25 ENCOUNTER — Telehealth: Payer: Self-pay | Admitting: Internal Medicine

## 2019-12-25 NOTE — Telephone Encounter (Signed)
   Tanda Rockers, MD  12/23/2019 5:29 PM EDT     Call pt: Reviewed cxr and no acute change so no change in recommendations made at [facility]   Advised pt of results. Pt understood and nothing further is needed.

## 2020-01-09 DIAGNOSIS — D631 Anemia in chronic kidney disease: Secondary | ICD-10-CM | POA: Diagnosis not present

## 2020-01-09 DIAGNOSIS — Z992 Dependence on renal dialysis: Secondary | ICD-10-CM | POA: Diagnosis not present

## 2020-01-09 DIAGNOSIS — N2581 Secondary hyperparathyroidism of renal origin: Secondary | ICD-10-CM | POA: Diagnosis not present

## 2020-01-09 DIAGNOSIS — D509 Iron deficiency anemia, unspecified: Secondary | ICD-10-CM | POA: Diagnosis not present

## 2020-01-09 DIAGNOSIS — N186 End stage renal disease: Secondary | ICD-10-CM | POA: Diagnosis not present

## 2020-01-14 DIAGNOSIS — D631 Anemia in chronic kidney disease: Secondary | ICD-10-CM | POA: Diagnosis not present

## 2020-01-14 DIAGNOSIS — N2581 Secondary hyperparathyroidism of renal origin: Secondary | ICD-10-CM | POA: Diagnosis not present

## 2020-01-14 DIAGNOSIS — Z992 Dependence on renal dialysis: Secondary | ICD-10-CM | POA: Diagnosis not present

## 2020-01-14 DIAGNOSIS — D509 Iron deficiency anemia, unspecified: Secondary | ICD-10-CM | POA: Diagnosis not present

## 2020-01-14 DIAGNOSIS — N186 End stage renal disease: Secondary | ICD-10-CM | POA: Diagnosis not present

## 2020-01-16 DIAGNOSIS — D509 Iron deficiency anemia, unspecified: Secondary | ICD-10-CM | POA: Diagnosis not present

## 2020-01-16 DIAGNOSIS — Z992 Dependence on renal dialysis: Secondary | ICD-10-CM | POA: Diagnosis not present

## 2020-01-16 DIAGNOSIS — N2581 Secondary hyperparathyroidism of renal origin: Secondary | ICD-10-CM | POA: Diagnosis not present

## 2020-01-16 DIAGNOSIS — D631 Anemia in chronic kidney disease: Secondary | ICD-10-CM | POA: Diagnosis not present

## 2020-01-16 DIAGNOSIS — N186 End stage renal disease: Secondary | ICD-10-CM | POA: Diagnosis not present

## 2020-01-19 DIAGNOSIS — D631 Anemia in chronic kidney disease: Secondary | ICD-10-CM | POA: Diagnosis not present

## 2020-01-19 DIAGNOSIS — Z992 Dependence on renal dialysis: Secondary | ICD-10-CM | POA: Diagnosis not present

## 2020-01-19 DIAGNOSIS — N186 End stage renal disease: Secondary | ICD-10-CM | POA: Diagnosis not present

## 2020-01-19 DIAGNOSIS — D509 Iron deficiency anemia, unspecified: Secondary | ICD-10-CM | POA: Diagnosis not present

## 2020-01-19 DIAGNOSIS — N2581 Secondary hyperparathyroidism of renal origin: Secondary | ICD-10-CM | POA: Diagnosis not present

## 2020-01-21 DIAGNOSIS — Z992 Dependence on renal dialysis: Secondary | ICD-10-CM | POA: Diagnosis not present

## 2020-01-21 DIAGNOSIS — D509 Iron deficiency anemia, unspecified: Secondary | ICD-10-CM | POA: Diagnosis not present

## 2020-01-21 DIAGNOSIS — N2581 Secondary hyperparathyroidism of renal origin: Secondary | ICD-10-CM | POA: Diagnosis not present

## 2020-01-21 DIAGNOSIS — D631 Anemia in chronic kidney disease: Secondary | ICD-10-CM | POA: Diagnosis not present

## 2020-01-21 DIAGNOSIS — N186 End stage renal disease: Secondary | ICD-10-CM | POA: Diagnosis not present

## 2020-01-23 DIAGNOSIS — Z992 Dependence on renal dialysis: Secondary | ICD-10-CM | POA: Diagnosis not present

## 2020-01-23 DIAGNOSIS — N2581 Secondary hyperparathyroidism of renal origin: Secondary | ICD-10-CM | POA: Diagnosis not present

## 2020-01-23 DIAGNOSIS — D509 Iron deficiency anemia, unspecified: Secondary | ICD-10-CM | POA: Diagnosis not present

## 2020-01-23 DIAGNOSIS — N186 End stage renal disease: Secondary | ICD-10-CM | POA: Diagnosis not present

## 2020-01-23 DIAGNOSIS — D631 Anemia in chronic kidney disease: Secondary | ICD-10-CM | POA: Diagnosis not present

## 2020-01-26 DIAGNOSIS — N2581 Secondary hyperparathyroidism of renal origin: Secondary | ICD-10-CM | POA: Diagnosis not present

## 2020-01-26 DIAGNOSIS — N186 End stage renal disease: Secondary | ICD-10-CM | POA: Diagnosis not present

## 2020-01-26 DIAGNOSIS — D509 Iron deficiency anemia, unspecified: Secondary | ICD-10-CM | POA: Diagnosis not present

## 2020-01-26 DIAGNOSIS — D631 Anemia in chronic kidney disease: Secondary | ICD-10-CM | POA: Diagnosis not present

## 2020-01-26 DIAGNOSIS — Z992 Dependence on renal dialysis: Secondary | ICD-10-CM | POA: Diagnosis not present

## 2020-01-28 DIAGNOSIS — Z992 Dependence on renal dialysis: Secondary | ICD-10-CM | POA: Diagnosis not present

## 2020-01-28 DIAGNOSIS — N186 End stage renal disease: Secondary | ICD-10-CM | POA: Diagnosis not present

## 2020-01-28 DIAGNOSIS — D509 Iron deficiency anemia, unspecified: Secondary | ICD-10-CM | POA: Diagnosis not present

## 2020-01-28 DIAGNOSIS — N2581 Secondary hyperparathyroidism of renal origin: Secondary | ICD-10-CM | POA: Diagnosis not present

## 2020-01-28 DIAGNOSIS — D631 Anemia in chronic kidney disease: Secondary | ICD-10-CM | POA: Diagnosis not present

## 2020-01-30 DIAGNOSIS — N186 End stage renal disease: Secondary | ICD-10-CM | POA: Diagnosis not present

## 2020-01-30 DIAGNOSIS — D631 Anemia in chronic kidney disease: Secondary | ICD-10-CM | POA: Diagnosis not present

## 2020-01-30 DIAGNOSIS — Z992 Dependence on renal dialysis: Secondary | ICD-10-CM | POA: Diagnosis not present

## 2020-01-30 DIAGNOSIS — N2581 Secondary hyperparathyroidism of renal origin: Secondary | ICD-10-CM | POA: Diagnosis not present

## 2020-01-30 DIAGNOSIS — D509 Iron deficiency anemia, unspecified: Secondary | ICD-10-CM | POA: Diagnosis not present

## 2020-02-02 DIAGNOSIS — N186 End stage renal disease: Secondary | ICD-10-CM | POA: Diagnosis not present

## 2020-02-02 DIAGNOSIS — D509 Iron deficiency anemia, unspecified: Secondary | ICD-10-CM | POA: Diagnosis not present

## 2020-02-02 DIAGNOSIS — N2581 Secondary hyperparathyroidism of renal origin: Secondary | ICD-10-CM | POA: Diagnosis not present

## 2020-02-02 DIAGNOSIS — Z992 Dependence on renal dialysis: Secondary | ICD-10-CM | POA: Diagnosis not present

## 2020-02-02 DIAGNOSIS — D631 Anemia in chronic kidney disease: Secondary | ICD-10-CM | POA: Diagnosis not present

## 2020-02-04 DIAGNOSIS — N2581 Secondary hyperparathyroidism of renal origin: Secondary | ICD-10-CM | POA: Diagnosis not present

## 2020-02-04 DIAGNOSIS — D631 Anemia in chronic kidney disease: Secondary | ICD-10-CM | POA: Diagnosis not present

## 2020-02-04 DIAGNOSIS — D509 Iron deficiency anemia, unspecified: Secondary | ICD-10-CM | POA: Diagnosis not present

## 2020-02-04 DIAGNOSIS — Z992 Dependence on renal dialysis: Secondary | ICD-10-CM | POA: Diagnosis not present

## 2020-02-04 DIAGNOSIS — N186 End stage renal disease: Secondary | ICD-10-CM | POA: Diagnosis not present

## 2020-02-05 ENCOUNTER — Ambulatory Visit (INDEPENDENT_AMBULATORY_CARE_PROVIDER_SITE_OTHER): Payer: Medicare Other | Admitting: Internal Medicine

## 2020-02-05 ENCOUNTER — Encounter: Payer: Self-pay | Admitting: Internal Medicine

## 2020-02-05 ENCOUNTER — Other Ambulatory Visit: Payer: Self-pay

## 2020-02-05 VITALS — BP 168/80 | HR 83 | Temp 98.5°F | Ht 70.0 in | Wt 118.6 lb

## 2020-02-05 DIAGNOSIS — F17210 Nicotine dependence, cigarettes, uncomplicated: Secondary | ICD-10-CM

## 2020-02-05 DIAGNOSIS — J449 Chronic obstructive pulmonary disease, unspecified: Secondary | ICD-10-CM | POA: Diagnosis not present

## 2020-02-05 NOTE — Patient Instructions (Addendum)
Omeprazole 40 mg Take 30- 60 min before your first and last meals of the day   GERD (REFLUX)  is an extremely common cause of respiratory symptoms just like yours , many times with no obvious heartburn at all.    It can be treated with medication, but also with lifestyle changes including elevation of the head of your bed (ideally with 6 -8inch blocks under the headboard of your bed),  Smoking cessation, avoidance of late meals, excessive alcohol, and avoid fatty foods, chocolate, peppermint, colas, red wine, and acidic juices such as orange juice.  NO MINT OR MENTHOL PRODUCTS SO NO COUGH DROPS  USE SUGARLESS CANDY INSTEAD (Jolley ranchers or Stover's or Life Savers) or even ice chips will also do - the key is to swallow to prevent all throat clearing. NO OIL BASED VITAMINS - use powdered substitutes.  Avoid fish oil when coughing.  No change in symbciort Take 2 puffs first thing in am and then another 2 puffs about 12 hours later.   Only use your albuterol as a rescue medication to be used if you can't catch your breath by resting or doing a relaxed purse lip breathing pattern.  - The less you use it, the better it will work when you need it. - Ok to use up to 2 puffs  every 4 hours if you must but call for immediate appointment if use goes up over your usual need - Don't leave home without it !!  (think of it like the spare tire for your car)     Please schedule a follow up office visit in 6 weeks with pfts same day , call sooner if needed bring  all your medications

## 2020-02-05 NOTE — Assessment & Plan Note (Addendum)
Active smoker - 02/05/2020  After extensive coaching inhaler device,  effectiveness =    75% -  02/05/2020   Walked RA  approx   300  ft  @ fast pace  stopped due to  End of studyh , no sob and sats 98% at end       DDX of  difficult airways management almost all start with A and  include Adherence, Ace Inhibitors, Acid Reflux, Active Sinus Disease, Alpha 1 Antitripsin deficiency, Anxiety masquerading as Airways dz,  ABPA,  Allergy(esp in young), Aspiration (esp in elderly), Adverse effects of meds,  Active smoking or vaping, A bunch of PE's (a small clot burden can't cause this syndrome unless there is already severe underlying pulm or vascular dz with poor reserve) plus two Bs  = Bronchiectasis and Beta blocker use..and one C= CHF  Adherence is always the initial "prime suspect" and is a multilayered concern that requires a "trust but verify" approach in every patient - starting with knowing how to use medications, especially inhalers, correctly, keeping up with refills and understanding the fundamental difference between maintenance and prns vs those medications only taken for a very short course and then stopped and not refilled.  - see hfa teaching  - return with all meds in hand using a trust but verify approach to confirm accurate Medication  Reconciliation The principal here is that until we are certain that the  patients are doing what we've asked, it makes no sense to ask them to do more.   Active smoker / top of the usual list of suspects -  (see separate a/p)     ? Acid (or non-acid) GERD > always difficult to exclude as up to 75% of pts in some series report no assoc GI/ Heartburn symptoms> rec max (24h)  acid suppression and diet restrictions/ reviewed and instructions given in writing.   ? Anxiety/depression  > usually at the bottom of this list of usual suspects but should be much higher on this pt's based on H and P and note already on psychotropics and may interfere with adherence and  also interpretation of response or lack thereof to symptom management which can be quite subjective.   ? chf > no change before vs after HD   ? Adverse drug effects/ BB  = > probably not enough to cause          Each maintenance medication was reviewed in detail including emphasizing most importantly the difference between maintenance and prns and under what circumstances the prns are to be triggered using an action plan format where appropriate.  Total time for H and P, chart review, counseling, teaching device  directly observing portions of ambulatory 02 saturation study/  and generating customized AVS unique to this office visit / charting = 28  min

## 2020-02-05 NOTE — Progress Notes (Signed)
Luke Mueller, male    DOB: [birth year]   MRN: [id number]   Brief patient profile:  75 yobm still smoking  ESRF on HD x around 2016 MWF   Admission date:  09/21/2019   Discharge Date:  09/23/2019    Discharge Diagnosis  Uremia [N19] Uremia of renal origin [N19]     End stage renal disease (HCC)   Tobacco abuse   Generalized weakness   HBP (high blood pressure)   Uremia of renal origin  Hx: 59 y.o.malewith medical history significant ofhypertension OPD, ESRD on hemodialysis Monday Wednesday Friday peripheral vascular disease presented to ED with complaints of generalized weakness secondary to poor oral intake and missing multiple sessions of dialysis. Patient states that he was always compliant with his dialysis but missed the dialysis sessions. Patient further mentioned that he went to his dialysis center but they advised him to go to emergency department for dialysis and they will accept him back otherwise he cannot get outpatient dialysis. Patient denies any fever, chills, chest pain, shortness of breath, nausea, vomiting, abdominal pain and swelling of bilateral lower extremities  ED Course:On arrival to the hospital patient had blood pressure of 147/70, heart rate 71, respiratory rate 13 and oxygen saturation 96% on room air. Blood work showed hemoglobin 12.9, BUN 125 and creatinine greater than 18. Chest x-ray showed cardiomegaly but no airspace disease. ED physician contacted Dr.Schertz/nephrology who recommended to admit the patient to the hospital and he will assist with dialysis in the morning.   A/p 1)ESRD--presented with uremia after multiple missed HD sessions -Had persistent emesis resolved p HD  -Prior to admission he waslast HD session was on 09/10/2019    2)HTN--okay to restart amlodipine at 5 mg (prior history of lower extremity edema with higher doses of amlodipine), restart hydralazine, restart torsemide, resume Coreg at a lower dose of 6.25 mg twice  daily  3)COPD--no acute exacerbation, continue bronchodilators  4)tobacco abuse---cessation advised, use nicotine patch  5)PAD-continue Pletal      History of Present Illness  12/23/2019  Pulmonary/ 1st office eval/Luke Mueller  Chief Complaint  Patient presents with  . Pulmonary Consult    Former Dr Luan Pulling pt. Pt states his breathing has been stable since last visit with [facility]. He has occ cough with clear sputum. He uses his albuterol inhaler about once per day.   Dyspnea:  Legs give out first  Cough: comes and goes with st x 48h assoc with  more white mucus and overt HB Sleep: bothered by HB despite ppi hs  SABA use: not sure what he has or how to use it/ has symbicort  rec Plan A = Automatic = Always=    Symbicort 160 Take 1- 2 puffs first thing in am and then another 1- 2 puffs about 12 hours later.  Work on inhaler technique:  Plan B = Backup (to supplement plan A, not to replace it) Only use your albuterol inhaler as a rescue medication   Omeprazole 40 mg Take 30- 60 min before your first and last meals of the day until heartburn Please remember to go to the  x-ray department at hospital  for your tests - we will call you with the results when they are available    Please schedule a follow up office visit in 6 weeks, call sooner if needed with pfts on return - Bring all your medicatoins and inhalers with you to your next visit      02/05/2020  f/u ov/Luke Mueller re:  Copd / ab  Chief Complaint  Patient presents with  . Follow-up    No complaints  Dyspnea:  Struggles to do steps/ [employer] for more than one aisle but highly variable  Cough variable sense of throat congestion / overt hb Sleeping: bed is flat  SABA use: none at all 02: none    No obvious pattterns  (eg before or after hD) day to day or daytime variability or assoc excess/ purulent sputum or mucus plugs or hemoptysis or cp or chest tightness, subjective wheeze or overt sinus or hb symptoms.   Sleeping  without  nocturnal  or early am exacerbation  of respiratory  c/o's or need for noct saba. Also denies any obvious fluctuation of symptoms with weather or environmental changes or other aggravating or alleviating factors except as outlined above   No unusual exposure hx or h/o childhood pna/ asthma or knowledge of premature birth.  Current Allergies, Complete Past Medical History, Past Surgical History, Family History, and Social History were reviewed in [employer] record.  ROS  The following are not active complaints unless bolded Hoarseness, sore throat, dysphagia, dental problems, itching, sneezing,  nasal congestion or discharge of excess mucus or purulent secretions, ear ache,   fever, chills, sweats, unintended wt loss or wt gain, classically pleuritic or exertional cp,  orthopnea pnd or arm/hand swelling  or leg swelling, presyncope, palpitations, abdominal pain, anorexia, nausea, vomiting, diarrhea  or change in bowel habits or change in bladder habits, change in stools or change in urine, dysuria, hematuria,  rash, arthralgias, visual complaints, headache, numbness, weakness or ataxia or problems with walking or coordination,  change in mood or  memory.        Current Meds - - NOTE:   Unable to verify as accurately reflecting what pt takes   very shaky on details of care   Medication Sig  . acetaminophen (TYLENOL) 325 MG tablet Take 2 tablets (650 mg total) by mouth every 6 (six) hours as needed for mild pain, fever or headache (or Fever >/= 101).  Marland Kitchen albuterol (VENTOLIN HFA) 108 (90 Base) MCG/ACT inhaler Inhale 2 puffs into the lungs every 6 (six) hours as needed for wheezing or shortness of breath.  Marland Kitchen amLODipine (NORVASC) 5 MG tablet Take 1 tablet (5 mg total) by mouth daily. (Patient taking differently: Take 5 mg by mouth daily. Two tabs to equal 10mg )  . budesonide-formoterol (SYMBICORT) 160-4.5 MCG/ACT inhaler Inhale 2 puffs into the lungs 2 (two) times daily.  .  carvedilol (COREG) 6.25 MG tablet Take 1 tablet (6.25 mg total) by mouth 2 (two) times daily with a meal.  . cilostazol (PLETAL) 100 MG tablet Take 1 tablet (100 mg total) by mouth 2 (two) times daily.  . cyclobenzaprine (FLEXERIL) 10 MG tablet Take 10 mg by mouth 2 (two) times daily.   . hydrALAZINE (APRESOLINE) 50 MG tablet Take 1 tablet (50 mg total) by mouth 3 (three) times daily.  Marland Kitchen loperamide (IMODIUM A-D) 2 MG tablet Take 1 tablet (2 mg total) by mouth 4 (four) times daily as needed for diarrhea or loose stools.  . multivitamin (RENA-VIT) TABS tablet Take 1 tablet by mouth daily.  Marland Kitchen omeprazole (PRILOSEC) 40 MG capsule Take 30- 60 min before your first and last meals of the day  . ondansetron (ZOFRAN ODT) 4 MG disintegrating tablet Take 1 tablet (4 mg total) by mouth every 8 (eight) hours as needed for nausea or vomiting.  . RESTASIS 0.05 %  ophthalmic emulsion Place 1 drop into both eyes 2 (two) times daily.  . sevelamer carbonate (RENVELA) 800 MG tablet 3 tablet each meal and 1 tablet with snack  . torsemide (DEMADEX) 100 MG tablet Take 100 mg by mouth daily.  . traMADol (ULTRAM) 50 MG tablet Take 1 tablet by mouth 2 (two) times daily as needed for moderate pain.               Past Medical History:  Diagnosis Date  . Chronic kidney disease   . COPD (chronic obstructive pulmonary disease) ([place])   . Dialysis patient ([place])   . ESRD (end stage renal disease) on dialysis ([place])   . GERD (gastroesophageal reflux disease)   . Hypertension   . Irregular heartbeat   . Peripheral vascular disease ([place])   . Pneumonia 09/02/2018       Objective:    Wt Readings from Last 3 Encounters:  02/05/20 118 lb 9.6 oz (53.8 kg)  12/23/19 120 lb (54.4 kg)  09/23/19 117 lb 1 oz (53.1 kg)     Vital signs reviewed - Note on arrival 02/05/2020  02 sats  98% on RA    Thin depressed appearing amb bm nad    HEENT : pt wearing mask not removed for exam due to covid - 19 concerns.    NECK :   without JVD/Nodes/TM/ nl carotid upstrokes bilaterally   LUNGS: no acc muscle use,  Mild barrel  contour chest wall with bilateral  Distant bs s audible wheeze and  without cough on insp or exp maneuvers  and mild  Hyperresonant  to  percussion bilaterally     CV:  RRR  no s3 or murmur or increase in P2, and no edema   ABD:  soft and nontender with pos end  insp Hoover's  in the supine position. No bruits or organomegaly appreciated, bowel sounds nl  MS:   Nl gait/  ext warm without deformities, calf tenderness, cyanosis or clubbing No obvious joint restrictions   SKIN: warm and dry without lesions    NEURO:  alert, approp, nl sensorium with  no motor or cerebellar deficits apparent.                     Assessment

## 2020-02-05 NOTE — Assessment & Plan Note (Signed)
3 min discussion re active cigarette smoking in addition to office E&M  Ask about tobacco use:   Ongoing  Advise quitting   I emphasized that although we never turn away smokers from the pulmonary clinic, we do ask that they understand that the recommendations that we make  won't work nearly as well in the presence of continued cigarette exposure. In fact, we may very well  reach a point where we can't promise to help the patient if he/she can't quit smoking. (We can and will promise to try to help, we just can't promise what we recommend will really work)  Assess willingness:  Not committed at this point Assist in quit attempt:  Per PCP when ready Arrange follow up:   Follow up per Primary Care planned

## 2020-02-06 DIAGNOSIS — Z992 Dependence on renal dialysis: Secondary | ICD-10-CM | POA: Diagnosis not present

## 2020-02-06 DIAGNOSIS — D631 Anemia in chronic kidney disease: Secondary | ICD-10-CM | POA: Diagnosis not present

## 2020-02-06 DIAGNOSIS — N186 End stage renal disease: Secondary | ICD-10-CM | POA: Diagnosis not present

## 2020-02-06 DIAGNOSIS — D509 Iron deficiency anemia, unspecified: Secondary | ICD-10-CM | POA: Diagnosis not present

## 2020-02-06 DIAGNOSIS — N2581 Secondary hyperparathyroidism of renal origin: Secondary | ICD-10-CM | POA: Diagnosis not present

## 2020-02-07 DIAGNOSIS — Z992 Dependence on renal dialysis: Secondary | ICD-10-CM | POA: Diagnosis not present

## 2020-02-07 DIAGNOSIS — N186 End stage renal disease: Secondary | ICD-10-CM | POA: Diagnosis not present

## 2020-02-09 DIAGNOSIS — Z992 Dependence on renal dialysis: Secondary | ICD-10-CM | POA: Diagnosis not present

## 2020-02-09 DIAGNOSIS — N2581 Secondary hyperparathyroidism of renal origin: Secondary | ICD-10-CM | POA: Diagnosis not present

## 2020-02-09 DIAGNOSIS — N186 End stage renal disease: Secondary | ICD-10-CM | POA: Diagnosis not present

## 2020-02-09 DIAGNOSIS — D509 Iron deficiency anemia, unspecified: Secondary | ICD-10-CM | POA: Diagnosis not present

## 2020-02-09 DIAGNOSIS — D631 Anemia in chronic kidney disease: Secondary | ICD-10-CM | POA: Diagnosis not present

## 2020-02-11 DIAGNOSIS — N2581 Secondary hyperparathyroidism of renal origin: Secondary | ICD-10-CM | POA: Diagnosis not present

## 2020-02-11 DIAGNOSIS — D509 Iron deficiency anemia, unspecified: Secondary | ICD-10-CM | POA: Diagnosis not present

## 2020-02-11 DIAGNOSIS — D631 Anemia in chronic kidney disease: Secondary | ICD-10-CM | POA: Diagnosis not present

## 2020-02-11 DIAGNOSIS — N186 End stage renal disease: Secondary | ICD-10-CM | POA: Diagnosis not present

## 2020-02-11 DIAGNOSIS — Z992 Dependence on renal dialysis: Secondary | ICD-10-CM | POA: Diagnosis not present

## 2020-02-16 DIAGNOSIS — N186 End stage renal disease: Secondary | ICD-10-CM | POA: Diagnosis not present

## 2020-02-16 DIAGNOSIS — D509 Iron deficiency anemia, unspecified: Secondary | ICD-10-CM | POA: Diagnosis not present

## 2020-02-16 DIAGNOSIS — N2581 Secondary hyperparathyroidism of renal origin: Secondary | ICD-10-CM | POA: Diagnosis not present

## 2020-02-16 DIAGNOSIS — D631 Anemia in chronic kidney disease: Secondary | ICD-10-CM | POA: Diagnosis not present

## 2020-02-16 DIAGNOSIS — Z992 Dependence on renal dialysis: Secondary | ICD-10-CM | POA: Diagnosis not present

## 2020-02-18 DIAGNOSIS — D509 Iron deficiency anemia, unspecified: Secondary | ICD-10-CM | POA: Diagnosis not present

## 2020-02-18 DIAGNOSIS — N186 End stage renal disease: Secondary | ICD-10-CM | POA: Diagnosis not present

## 2020-02-18 DIAGNOSIS — N2581 Secondary hyperparathyroidism of renal origin: Secondary | ICD-10-CM | POA: Diagnosis not present

## 2020-02-18 DIAGNOSIS — D631 Anemia in chronic kidney disease: Secondary | ICD-10-CM | POA: Diagnosis not present

## 2020-02-18 DIAGNOSIS — Z992 Dependence on renal dialysis: Secondary | ICD-10-CM | POA: Diagnosis not present

## 2020-02-20 DIAGNOSIS — D509 Iron deficiency anemia, unspecified: Secondary | ICD-10-CM | POA: Diagnosis not present

## 2020-02-20 DIAGNOSIS — N2581 Secondary hyperparathyroidism of renal origin: Secondary | ICD-10-CM | POA: Diagnosis not present

## 2020-02-20 DIAGNOSIS — D631 Anemia in chronic kidney disease: Secondary | ICD-10-CM | POA: Diagnosis not present

## 2020-02-20 DIAGNOSIS — N186 End stage renal disease: Secondary | ICD-10-CM | POA: Diagnosis not present

## 2020-02-20 DIAGNOSIS — Z992 Dependence on renal dialysis: Secondary | ICD-10-CM | POA: Diagnosis not present

## 2020-02-23 DIAGNOSIS — N186 End stage renal disease: Secondary | ICD-10-CM | POA: Diagnosis not present

## 2020-02-23 DIAGNOSIS — D631 Anemia in chronic kidney disease: Secondary | ICD-10-CM | POA: Diagnosis not present

## 2020-02-23 DIAGNOSIS — Z992 Dependence on renal dialysis: Secondary | ICD-10-CM | POA: Diagnosis not present

## 2020-02-23 DIAGNOSIS — D509 Iron deficiency anemia, unspecified: Secondary | ICD-10-CM | POA: Diagnosis not present

## 2020-02-23 DIAGNOSIS — N2581 Secondary hyperparathyroidism of renal origin: Secondary | ICD-10-CM | POA: Diagnosis not present

## 2020-02-25 DIAGNOSIS — D509 Iron deficiency anemia, unspecified: Secondary | ICD-10-CM | POA: Diagnosis not present

## 2020-02-25 DIAGNOSIS — N186 End stage renal disease: Secondary | ICD-10-CM | POA: Diagnosis not present

## 2020-02-25 DIAGNOSIS — D631 Anemia in chronic kidney disease: Secondary | ICD-10-CM | POA: Diagnosis not present

## 2020-02-25 DIAGNOSIS — Z992 Dependence on renal dialysis: Secondary | ICD-10-CM | POA: Diagnosis not present

## 2020-02-25 DIAGNOSIS — N2581 Secondary hyperparathyroidism of renal origin: Secondary | ICD-10-CM | POA: Diagnosis not present

## 2020-02-27 DIAGNOSIS — D509 Iron deficiency anemia, unspecified: Secondary | ICD-10-CM | POA: Diagnosis not present

## 2020-02-27 DIAGNOSIS — D631 Anemia in chronic kidney disease: Secondary | ICD-10-CM | POA: Diagnosis not present

## 2020-02-27 DIAGNOSIS — N2581 Secondary hyperparathyroidism of renal origin: Secondary | ICD-10-CM | POA: Diagnosis not present

## 2020-02-27 DIAGNOSIS — Z992 Dependence on renal dialysis: Secondary | ICD-10-CM | POA: Diagnosis not present

## 2020-02-27 DIAGNOSIS — N186 End stage renal disease: Secondary | ICD-10-CM | POA: Diagnosis not present

## 2020-03-01 ENCOUNTER — Ambulatory Visit: Payer: Medicare Other | Admitting: Gastroenterology

## 2020-03-01 ENCOUNTER — Encounter: Payer: Self-pay | Admitting: Gastroenterology

## 2020-03-01 DIAGNOSIS — D631 Anemia in chronic kidney disease: Secondary | ICD-10-CM | POA: Diagnosis not present

## 2020-03-01 DIAGNOSIS — N2581 Secondary hyperparathyroidism of renal origin: Secondary | ICD-10-CM | POA: Diagnosis not present

## 2020-03-01 DIAGNOSIS — D509 Iron deficiency anemia, unspecified: Secondary | ICD-10-CM | POA: Diagnosis not present

## 2020-03-01 DIAGNOSIS — N186 End stage renal disease: Secondary | ICD-10-CM | POA: Diagnosis not present

## 2020-03-01 DIAGNOSIS — Z992 Dependence on renal dialysis: Secondary | ICD-10-CM | POA: Diagnosis not present
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
## 2020-03-03 DIAGNOSIS — D631 Anemia in chronic kidney disease: Secondary | ICD-10-CM | POA: Diagnosis not present

## 2020-03-03 DIAGNOSIS — N2581 Secondary hyperparathyroidism of renal origin: Secondary | ICD-10-CM | POA: Diagnosis not present

## 2020-03-03 DIAGNOSIS — Z992 Dependence on renal dialysis: Secondary | ICD-10-CM | POA: Diagnosis not present

## 2020-03-03 DIAGNOSIS — D509 Iron deficiency anemia, unspecified: Secondary | ICD-10-CM | POA: Diagnosis not present

## 2020-03-03 DIAGNOSIS — N186 End stage renal disease: Secondary | ICD-10-CM | POA: Diagnosis not present

## 2020-03-05 DIAGNOSIS — D509 Iron deficiency anemia, unspecified: Secondary | ICD-10-CM | POA: Diagnosis not present

## 2020-03-05 DIAGNOSIS — N186 End stage renal disease: Secondary | ICD-10-CM | POA: Diagnosis not present

## 2020-03-05 DIAGNOSIS — D631 Anemia in chronic kidney disease: Secondary | ICD-10-CM | POA: Diagnosis not present

## 2020-03-05 DIAGNOSIS — N2581 Secondary hyperparathyroidism of renal origin: Secondary | ICD-10-CM | POA: Diagnosis not present

## 2020-03-05 DIAGNOSIS — Z992 Dependence on renal dialysis: Secondary | ICD-10-CM | POA: Diagnosis not present

## 2020-03-08 DIAGNOSIS — D509 Iron deficiency anemia, unspecified: Secondary | ICD-10-CM | POA: Diagnosis not present

## 2020-03-08 DIAGNOSIS — D631 Anemia in chronic kidney disease: Secondary | ICD-10-CM | POA: Diagnosis not present

## 2020-03-08 DIAGNOSIS — N186 End stage renal disease: Secondary | ICD-10-CM | POA: Diagnosis not present

## 2020-03-08 DIAGNOSIS — Z992 Dependence on renal dialysis: Secondary | ICD-10-CM | POA: Diagnosis not present

## 2020-03-08 DIAGNOSIS — N2581 Secondary hyperparathyroidism of renal origin: Secondary | ICD-10-CM | POA: Diagnosis not present

## 2020-03-09 ENCOUNTER — Telehealth: Payer: Self-pay | Admitting: Internal Medicine

## 2020-03-09 ENCOUNTER — Ambulatory Visit: Payer: Medicare Other | Admitting: Gastroenterology

## 2020-03-09 DIAGNOSIS — N186 End stage renal disease: Secondary | ICD-10-CM | POA: Diagnosis not present

## 2020-03-09 DIAGNOSIS — Z992 Dependence on renal dialysis: Secondary | ICD-10-CM | POA: Diagnosis not present

## 2020-03-09 NOTE — Telephone Encounter (Signed)
Pt wanted to inform us that he had to work today so that's why he couldn't make his appt.

## 2020-03-09 NOTE — Telephone Encounter (Signed)
Pt said he was returning a call from [place], [place]. [phone]

## 2020-03-10 DIAGNOSIS — D509 Iron deficiency anemia, unspecified: Secondary | ICD-10-CM | POA: Diagnosis not present

## 2020-03-10 DIAGNOSIS — N2581 Secondary hyperparathyroidism of renal origin: Secondary | ICD-10-CM | POA: Diagnosis not present

## 2020-03-10 DIAGNOSIS — Z992 Dependence on renal dialysis: Secondary | ICD-10-CM | POA: Diagnosis not present

## 2020-03-10 DIAGNOSIS — D631 Anemia in chronic kidney disease: Secondary | ICD-10-CM | POA: Diagnosis not present

## 2020-03-10 DIAGNOSIS — N186 End stage renal disease: Secondary | ICD-10-CM | POA: Diagnosis not present

## 2020-03-12 DIAGNOSIS — D631 Anemia in chronic kidney disease: Secondary | ICD-10-CM | POA: Diagnosis not present

## 2020-03-12 DIAGNOSIS — N186 End stage renal disease: Secondary | ICD-10-CM | POA: Diagnosis not present

## 2020-03-12 DIAGNOSIS — N2581 Secondary hyperparathyroidism of renal origin: Secondary | ICD-10-CM | POA: Diagnosis not present

## 2020-03-12 DIAGNOSIS — Z992 Dependence on renal dialysis: Secondary | ICD-10-CM | POA: Diagnosis not present

## 2020-03-12 DIAGNOSIS — D509 Iron deficiency anemia, unspecified: Secondary | ICD-10-CM | POA: Diagnosis not present

## 2020-03-15 DIAGNOSIS — D509 Iron deficiency anemia, unspecified: Secondary | ICD-10-CM | POA: Diagnosis not present

## 2020-03-15 DIAGNOSIS — N2581 Secondary hyperparathyroidism of renal origin: Secondary | ICD-10-CM | POA: Diagnosis not present

## 2020-03-15 DIAGNOSIS — Z992 Dependence on renal dialysis: Secondary | ICD-10-CM | POA: Diagnosis not present

## 2020-03-15 DIAGNOSIS — N186 End stage renal disease: Secondary | ICD-10-CM | POA: Diagnosis not present

## 2020-03-15 DIAGNOSIS — D631 Anemia in chronic kidney disease: Secondary | ICD-10-CM | POA: Diagnosis not present

## 2020-03-17 DIAGNOSIS — N2581 Secondary hyperparathyroidism of renal origin: Secondary | ICD-10-CM | POA: Diagnosis not present

## 2020-03-17 DIAGNOSIS — D509 Iron deficiency anemia, unspecified: Secondary | ICD-10-CM | POA: Diagnosis not present

## 2020-03-17 DIAGNOSIS — Z992 Dependence on renal dialysis: Secondary | ICD-10-CM | POA: Diagnosis not present

## 2020-03-17 DIAGNOSIS — D631 Anemia in chronic kidney disease: Secondary | ICD-10-CM | POA: Diagnosis not present

## 2020-03-17 DIAGNOSIS — N186 End stage renal disease: Secondary | ICD-10-CM | POA: Diagnosis not present

## 2020-03-22 DIAGNOSIS — D631 Anemia in chronic kidney disease: Secondary | ICD-10-CM | POA: Diagnosis not present

## 2020-03-22 DIAGNOSIS — D509 Iron deficiency anemia, unspecified: Secondary | ICD-10-CM | POA: Diagnosis not present

## 2020-03-22 DIAGNOSIS — N186 End stage renal disease: Secondary | ICD-10-CM | POA: Diagnosis not present

## 2020-03-22 DIAGNOSIS — Z992 Dependence on renal dialysis: Secondary | ICD-10-CM | POA: Diagnosis not present

## 2020-03-22 DIAGNOSIS — N2581 Secondary hyperparathyroidism of renal origin: Secondary | ICD-10-CM | POA: Diagnosis not present

## 2020-03-23 DIAGNOSIS — F17200 Nicotine dependence, unspecified, uncomplicated: Secondary | ICD-10-CM | POA: Diagnosis not present

## 2020-03-23 DIAGNOSIS — J449 Chronic obstructive pulmonary disease, unspecified: Secondary | ICD-10-CM | POA: Diagnosis not present

## 2020-03-23 DIAGNOSIS — I10 Essential (primary) hypertension: Secondary | ICD-10-CM | POA: Diagnosis not present

## 2020-03-23 DIAGNOSIS — N186 End stage renal disease: Secondary | ICD-10-CM | POA: Diagnosis not present

## 2020-03-24 DIAGNOSIS — D509 Iron deficiency anemia, unspecified: Secondary | ICD-10-CM | POA: Diagnosis not present

## 2020-03-24 DIAGNOSIS — Z992 Dependence on renal dialysis: Secondary | ICD-10-CM | POA: Diagnosis not present

## 2020-03-24 DIAGNOSIS — N2581 Secondary hyperparathyroidism of renal origin: Secondary | ICD-10-CM | POA: Diagnosis not present

## 2020-03-24 DIAGNOSIS — D631 Anemia in chronic kidney disease: Secondary | ICD-10-CM | POA: Diagnosis not present

## 2020-03-24 DIAGNOSIS — N186 End stage renal disease: Secondary | ICD-10-CM | POA: Diagnosis not present

## 2020-03-26 ENCOUNTER — Other Ambulatory Visit: Payer: Self-pay

## 2020-03-26 ENCOUNTER — Other Ambulatory Visit (HOSPITAL_COMMUNITY): Payer: Medicare Other

## 2020-03-26 ENCOUNTER — Other Ambulatory Visit (HOSPITAL_COMMUNITY)
Admission: RE | Admit: 2020-03-26 | Discharge: 2020-03-26 | Disposition: A | Discharge status: Home / Self Care | Payer: Medicare Other | Source: Ambulatory Visit | Attending: Internal Medicine | Admitting: Internal Medicine

## 2020-03-26 DIAGNOSIS — Z992 Dependence on renal dialysis: Secondary | ICD-10-CM | POA: Diagnosis not present

## 2020-03-26 DIAGNOSIS — N186 End stage renal disease: Secondary | ICD-10-CM | POA: Diagnosis not present

## 2020-03-26 DIAGNOSIS — Z01812 Encounter for preprocedural laboratory examination: Secondary | ICD-10-CM | POA: Insufficient documentation

## 2020-03-26 DIAGNOSIS — N2581 Secondary hyperparathyroidism of renal origin: Secondary | ICD-10-CM | POA: Diagnosis not present

## 2020-03-26 DIAGNOSIS — Z20822 Contact with and (suspected) exposure to covid-19: Secondary | ICD-10-CM | POA: Insufficient documentation

## 2020-03-26 DIAGNOSIS — D631 Anemia in chronic kidney disease: Secondary | ICD-10-CM | POA: Diagnosis not present

## 2020-03-26 DIAGNOSIS — D509 Iron deficiency anemia, unspecified: Secondary | ICD-10-CM | POA: Diagnosis not present

## 2020-03-27 LAB — SARS CORONAVIRUS 2 (TAT 6-24 HRS): SARS Coronavirus 2: NEGATIVE

## 2020-03-29 ENCOUNTER — Ambulatory Visit: Payer: Medicare Other | Admitting: Internal Medicine

## 2020-03-29 DIAGNOSIS — D509 Iron deficiency anemia, unspecified: Secondary | ICD-10-CM | POA: Diagnosis not present

## 2020-03-29 DIAGNOSIS — D631 Anemia in chronic kidney disease: Secondary | ICD-10-CM | POA: Diagnosis not present

## 2020-03-29 DIAGNOSIS — N2581 Secondary hyperparathyroidism of renal origin: Secondary | ICD-10-CM | POA: Diagnosis not present

## 2020-03-29 DIAGNOSIS — N186 End stage renal disease: Secondary | ICD-10-CM | POA: Diagnosis not present

## 2020-03-29 DIAGNOSIS — Z992 Dependence on renal dialysis: Secondary | ICD-10-CM | POA: Diagnosis not present

## 2020-03-30 ENCOUNTER — Other Ambulatory Visit: Payer: Self-pay

## 2020-03-30 ENCOUNTER — Ambulatory Visit (HOSPITAL_COMMUNITY)
Admission: RE | Admit: 2020-03-30 | Discharge: 2020-03-30 | Disposition: A | Discharge status: Home / Self Care | Payer: Medicare Other | Source: Ambulatory Visit | Attending: Internal Medicine | Admitting: Internal Medicine

## 2020-03-30 DIAGNOSIS — J449 Chronic obstructive pulmonary disease, unspecified: Secondary | ICD-10-CM | POA: Insufficient documentation

## 2020-03-30 LAB — PULMONARY FUNCTION TEST
DL/VA % pred: 53 %
DL/VA: 2.29 ml/min/mmHg/L
DLCO unc % pred: 38 %
DLCO unc: 10.78 ml/min/mmHg
FEF 25-75 Post: 0.55 L/sec
FEF 25-75 Pre: 1.26 L/sec
FEF2575-%Change-Post: -56 %
FEF2575-%Pred-Post: 18 %
FEF2575-%Pred-Pre: 42 %
FEV1-%Change-Post: -12 %
FEV1-%Pred-Post: 57 %
FEV1-%Pred-Pre: 66 %
FEV1-Post: 1.83 L
FEV1-Pre: 2.1 L
FEV1FVC-%Change-Post: 1 %
FEV1FVC-%Pred-Pre: 80 %
FEV6-%Change-Post: -13 %
FEV6-%Pred-Post: 70 %
FEV6-%Pred-Pre: 81 %
FEV6-Post: 2.75 L
FEV6-Pre: 3.18 L
FEV6FVC-%Change-Post: 1 %
FEV6FVC-%Pred-Post: 100 %
FEV6FVC-%Pred-Pre: 99 %
FVC-%Change-Post: -13 %
FVC-%Pred-Post: 70 %
FVC-%Pred-Pre: 81 %
FVC-Post: 2.84 L
Post FEV1/FVC ratio: 64 %
Post FEV6/FVC ratio: 97 %
Pre FEV1/FVC ratio: 63 %
Pre FEV6/FVC Ratio: 96 %
RV % pred: 135 %
RV: 2.98 L
TLC % pred: 91 %
TLC: 6.36 L

## 2020-03-30 MED ORDER — ALBUTEROL SULFATE (2.5 MG/3ML) 0.083% IN NEBU
2.5000 mg | INHALATION_SOLUTION | Freq: Once | RESPIRATORY_TRACT | Status: AC
  Administered 2020-03-30: 2.5 mg via RESPIRATORY_TRACT

## 2020-03-31 DIAGNOSIS — Z992 Dependence on renal dialysis: Secondary | ICD-10-CM | POA: Diagnosis not present

## 2020-03-31 DIAGNOSIS — D631 Anemia in chronic kidney disease: Secondary | ICD-10-CM | POA: Diagnosis not present

## 2020-03-31 DIAGNOSIS — D509 Iron deficiency anemia, unspecified: Secondary | ICD-10-CM | POA: Diagnosis not present

## 2020-03-31 DIAGNOSIS — N2581 Secondary hyperparathyroidism of renal origin: Secondary | ICD-10-CM | POA: Diagnosis not present

## 2020-03-31 DIAGNOSIS — N186 End stage renal disease: Secondary | ICD-10-CM | POA: Diagnosis not present

## 2020-04-01 ENCOUNTER — Ambulatory Visit (INDEPENDENT_AMBULATORY_CARE_PROVIDER_SITE_OTHER): Payer: Medicare Other | Admitting: Internal Medicine

## 2020-04-01 ENCOUNTER — Other Ambulatory Visit: Payer: Self-pay

## 2020-04-01 ENCOUNTER — Encounter: Payer: Self-pay | Admitting: Internal Medicine

## 2020-04-01 DIAGNOSIS — F17210 Nicotine dependence, cigarettes, uncomplicated: Secondary | ICD-10-CM | POA: Diagnosis not present

## 2020-04-01 DIAGNOSIS — J449 Chronic obstructive pulmonary disease, unspecified: Secondary | ICD-10-CM

## 2020-04-01 NOTE — Progress Notes (Signed)
Luke Mueller, male    DOB: [birth year]   MRN: [id number]   Brief patient profile:  89 yobm still smoking  ESRF on HD x around 2016 MWF   Admission date:  09/21/2019   Discharge Date:  09/23/2019    Discharge Diagnosis  Uremia [N19] Uremia of renal origin [N19]     End stage renal disease (HCC)   Tobacco abuse   Generalized weakness   HBP (high blood pressure)   Uremia of renal origin  Hx: 59 y.o.malewith medical history significant ofhypertension OPD, ESRD on hemodialysis Monday Wednesday Friday peripheral vascular disease presented to ED with complaints of generalized weakness secondary to poor oral intake and missing multiple sessions of dialysis. Patient states that he was always compliant with his dialysis but missed the dialysis sessions. Patient further mentioned that he went to his dialysis center but they advised him to go to emergency department for dialysis and they will accept him back otherwise he cannot get outpatient dialysis. Patient denies any fever, chills, chest pain, shortness of breath, nausea, vomiting, abdominal pain and swelling of bilateral lower extremities  ED Course:On arrival to the hospital patient had blood pressure of 147/70, heart rate 71, respiratory rate 13 and oxygen saturation 96% on room air. Blood work showed hemoglobin 12.9, BUN 125 and creatinine greater than 18. Chest x-ray showed cardiomegaly but no airspace disease. ED physician contacted Dr.Schertz/nephrology who recommended to admit the patient to the hospital and he will assist with dialysis in the morning.   A/p 1)ESRD--presented with uremia after multiple missed HD sessions -Had persistent emesis resolved p HD  -Prior to admission he waslast HD session was on 09/10/2019    2)HTN--okay to restart amlodipine at 5 mg (prior history of lower extremity edema with higher doses of amlodipine), restart hydralazine, restart torsemide, resume Coreg at a lower dose of 6.25 mg twice  daily  3)COPD--no acute exacerbation, continue bronchodilators  4)tobacco abuse---cessation advised, use nicotine patch  5)PAD-continue Pletal      History of Present Illness  12/23/2019  Pulmonary/ 1st office eval/Aleaha Fickling  Chief Complaint  Patient presents with  . Pulmonary Consult    Former Dr Luan Pulling pt. Pt states his breathing has been stable since last visit with [facility]. He has occ cough with clear sputum. He uses his albuterol inhaler about once per day.   Dyspnea:  Legs give out first  Cough: comes and goes with st x 48h assoc with  more white mucus and overt HB Sleep: bothered by HB despite ppi hs  SABA use: not sure what he has or how to use it/ has symbicort  rec Plan A = Automatic = Always=    Symbicort 160 Take 1- 2 puffs first thing in am and then another 1- 2 puffs about 12 hours later.  Work on inhaler technique:  Plan B = Backup (to supplement plan A, not to replace it) Only use your albuterol inhaler as a rescue medication   Omeprazole 40 mg Take 30- 60 min before your first and last meals of the day until heartburn Please remember to go to the  x-ray department at hospital  for your tests - we will call you with the results when they are available    Please schedule a follow up office visit in 6 weeks, call sooner if needed with pfts on return - Bring all your medicatoins and inhalers with you to your next visit      02/05/2020  f/u ov/Jestina Stephani re:  Copd / ab  Chief Complaint  Patient presents with  . Follow-up    No complaints  Dyspnea:  Struggles to do steps/ [employer] for more than one aisle but highly variable  Cough variable sense of throat congestion / overt hb Sleeping: bed is flat  SABA use: none at all 02: none  rec Omeprazole 40 mg Take 30- 60 min before your first and last meals of the day  GERD  Diet . No change in symbciort Take 2 puffs first thing in am and then another 2 puffs about 12 hours later.  Only use your albuterol as a rescue  medication Please schedule a follow up office visit in 6 weeks with pfts same day , call sooner if needed bring  all your medications      04/01/2020  f/u ov/Kathlean Cinco re: still smoking/ gold II copd maint symbicort / did not take ppi as rec  Chief Complaint  Patient presents with  . Follow-up    No complaints  Dyspnea:  More limited by legs hurting  Cough: clearing throat   Sleeping: bed is flat and   nightly discomfort R shoulder / resolves if lies on L side  - noted since 03/2019  SABA use: rarely  02: none     No obvious day to day or daytime variability or assoc excess/ purulent sputum or mucus plugs or hemoptysis or cp or chest tightness, subjective wheeze or overt sinus or hb symptoms.   Sleeping  without nocturnal  or early am exacerbation  of respiratory  c/o's or need for noct saba. Also denies any obvious fluctuation of symptoms with weather or environmental changes or other aggravating or alleviating factors except as outlined above   No unusual exposure hx or h/o childhood pna/ asthma or knowledge of premature birth.  Current Allergies, Complete Past Medical History, Past Surgical History, Family History, and Social History were reviewed in [employer] record.  ROS  The following are not active complaints unless bolded Hoarseness, sore throat, dysphagia, dental problems, itching, sneezing,  nasal congestion or discharge of excess mucus or purulent secretions, ear ache,   fever, chills, sweats, unintended wt loss or wt gain, classically pleuritic or exertional cp,  orthopnea pnd or arm/hand swelling  or leg swelling, presyncope, palpitations, abdominal pain, anorexia, nausea, vomiting, diarrhea  or change in bowel habits or change in bladder habits, change in stools or change in urine, dysuria, hematuria,  rash, arthralgias, visual complaints, headache, numbness, weakness or ataxia or problems with walking or coordination,  change in mood or  memory.         Current Meds  -  - NOTE:   Unable to verify as accurately reflecting what pt takes     Medication Sig  . acetaminophen (TYLENOL) 325 MG tablet Take 2 tablets (650 mg total) by mouth every 6 (six) hours as needed for mild pain, fever or headache (or Fever >/= 101).  Marland Kitchen albuterol (VENTOLIN HFA) 108 (90 Base) MCG/ACT inhaler Inhale 2 puffs into the lungs every 6 (six) hours as needed for wheezing or shortness of breath.  Marland Kitchen amLODipine (NORVASC) 10 MG tablet TAKE ONE TABLET BY MOUTHCONCE DAILY.  . budesonide-formoterol (SYMBICORT) 160-4.5 MCG/ACT inhaler Inhale 2 puffs into the lungs 2 (two) times daily.  . carvedilol (COREG) 6.25 MG tablet Take 1 tablet (6.25 mg total) by mouth 2 (two) times daily with a meal.  . cilostazol (PLETAL) 100 MG tablet Take 1 tablet (100 mg total) by  mouth 2 (two) times daily.  . cyclobenzaprine (FLEXERIL) 10 MG tablet Take 10 mg by mouth 2 (two) times daily.   Marland Kitchen GAVILYTE-G 236 g solution TAKE AS DIRECTED PERCPACKAGE INSTRUCTIONS.  . hydrALAZINE (APRESOLINE) 25 MG tablet Take 25 mg by mouth 3 (three) times daily.  . hydrALAZINE (APRESOLINE) 50 MG tablet Take 1 tablet (50 mg total) by mouth 3 (three) times daily.  Marland Kitchen loperamide (IMODIUM A-D) 2 MG tablet Take 1 tablet (2 mg total) by mouth 4 (four) times daily as needed for diarrhea or loose stools.  . multivitamin (RENA-VIT) TABS tablet Take 1 tablet by mouth daily.  Marland Kitchen omeprazole (PRILOSEC) 40 MG capsule Take 30- 60 min before your first and last meals of the day  . ondansetron (ZOFRAN ODT) 4 MG disintegrating tablet Take 1 tablet (4 mg total) by mouth every 8 (eight) hours as needed for nausea or vomiting.  . RESTASIS 0.05 % ophthalmic emulsion Place 1 drop into both eyes 2 (two) times daily.  . sevelamer carbonate (RENVELA) 800 MG tablet 3 tablet each meal and 1 tablet with snack  . torsemide (DEMADEX) 100 MG tablet Take 100 mg by mouth daily.  . traMADol (ULTRAM) 50 MG tablet Take 1 tablet by mouth 2 (two) times daily  as needed for moderate pain.   . [DISCONTINUED] amLODipine (NORVASC) 5 MG tablet Take 1 tablet (5 mg total) by mouth daily. (Patient taking differently: Take 5 mg by mouth daily. Two tabs to equal 10mg )               Past Medical History:  Diagnosis Date  . Chronic kidney disease   . COPD (chronic obstructive pulmonary disease) ([place])   . Dialysis patient ([place])   . ESRD (end stage renal disease) on dialysis ([place])   . GERD (gastroesophageal reflux disease)   . Hypertension   . Irregular heartbeat   . Peripheral vascular disease ([place])   . Pneumonia 09/02/2018       Objective:    04/01/2020       119    02/05/20 118 lb 9.6 oz (53.8 kg)  12/23/19 120 lb (54.4 kg)  09/23/19 117 lb 1 oz (53.1 kg)    Vital signs reviewed  04/01/2020  - Note at rest 02 sats  99% on RA  amb bm brighter affect/ nad     HEENT : pt wearing mask not removed for exam due to covid - 19 concerns.   NECK :  without JVD/Nodes/TM/ nl carotid upstrokes bilaterally   LUNGS: no acc muscle use,  Min barrel  contour chest wall with bilateral  slightly decreased bs s audible wheeze and  without cough on insp or exp maneuvers and min  Hyperresonant  to  percussion bilaterally     CV:  RRR  no s3 or murmur or increase in P2, and no edema   ABD:  soft and nontender with pos end  insp Hoover's  in the supine position. No bruits or organomegaly appreciated, bowel sounds nl  MS:   Nl gait/  ext warm without deformities, calf tenderness, cyanosis or clubbing No obvious joint restrictions   SKIN: warm and dry without lesions    NEURO:  alert, approp, nl sensorium with  no motor or cerebellar deficits apparent.            Assessment

## 2020-04-01 NOTE — Patient Instructions (Signed)
The key is to stop smoking completely before smoking completely stops you!  Plan A = Automatic = Always=    Symbicort 160 Take 2 puffs first thing in am and then another 2 puffs about 12 hours later.      Plan B = Backup (to supplement plan A, not to replace it) Only use your albuterol inhaler as a rescue medication to be used if you can't catch your breath by resting or doing a relaxed purse lip breathing pattern.  - The less you use it, the better it will work when you need it. - Ok to use the inhaler up to 2 puffs  every 4 hours if you must but call for appointment if use goes up over your usual need - Don't leave home without it !!  (think of it like the spare tire for your car)      Please schedule a follow up visit in 6 months with all inhalers in hand  - but call sooner if needed

## 2020-04-01 NOTE — Assessment & Plan Note (Signed)
Counseled re importance of smoking cessation but did not meet time criteria for separate billing    Used analogy of car with no air filter to explain the effects of cigs on performance of his "engine"  And that nothing could be done to ameliorate this short of stopping all smoking while he still has relatively well preserved FEV1

## 2020-04-01 NOTE — Assessment & Plan Note (Signed)
Active smoker - 02/05/2020  After extensive coaching inhaler device,  effectiveness =    75%  -  02/05/2020   Walked RA  approx   300  ft  @ fast pace  stopped due to  End of study , no sob and sats 98% at end     - Spirometry 03/30/2020  FEV1 2.10 (68%)  Ratio 0.63 but minimal curvature and did not meet ats criteria    As I explained to this patient in detail:  although there is  copd present, it may not be clinically relevant:  That is to say:   this pt is so sedentary I don't recommend aggressive pulmonary rx at this point unless limiting symptoms arise or acute exacerbations become as issue, neither of which is the case now.  I asked the patient to contact this office at any time in the future should either of these problems arise.    In meantime continue symbicort 160 2bid with option of changing to breztri for limiting sob or tendency to aecopd   >>> f/u in 6 m, sooner if needed           Each maintenance medication was reviewed in detail including emphasizing most importantly the difference between maintenance and prns and under what circumstances the prns are to be triggered using an action plan format where appropriate.  Total time for H and P, chart review, counseling, teaching device and generating customized AVS unique to this office visit / charting = 30 min

## 2020-04-02 DIAGNOSIS — Z992 Dependence on renal dialysis: Secondary | ICD-10-CM | POA: Diagnosis not present

## 2020-04-02 DIAGNOSIS — N186 End stage renal disease: Secondary | ICD-10-CM | POA: Diagnosis not present

## 2020-04-02 DIAGNOSIS — N2581 Secondary hyperparathyroidism of renal origin: Secondary | ICD-10-CM | POA: Diagnosis not present

## 2020-04-02 DIAGNOSIS — D631 Anemia in chronic kidney disease: Secondary | ICD-10-CM | POA: Diagnosis not present

## 2020-04-02 DIAGNOSIS — D509 Iron deficiency anemia, unspecified: Secondary | ICD-10-CM | POA: Diagnosis not present

## 2020-04-05 DIAGNOSIS — D631 Anemia in chronic kidney disease: Secondary | ICD-10-CM | POA: Diagnosis not present

## 2020-04-05 DIAGNOSIS — N186 End stage renal disease: Secondary | ICD-10-CM | POA: Diagnosis not present

## 2020-04-05 DIAGNOSIS — N2581 Secondary hyperparathyroidism of renal origin: Secondary | ICD-10-CM | POA: Diagnosis not present

## 2020-04-05 DIAGNOSIS — Z992 Dependence on renal dialysis: Secondary | ICD-10-CM | POA: Diagnosis not present

## 2020-04-05 DIAGNOSIS — D509 Iron deficiency anemia, unspecified: Secondary | ICD-10-CM | POA: Diagnosis not present

## 2020-04-08 DIAGNOSIS — Z992 Dependence on renal dialysis: Secondary | ICD-10-CM | POA: Diagnosis not present

## 2020-04-08 DIAGNOSIS — N186 End stage renal disease: Secondary | ICD-10-CM | POA: Diagnosis not present

## 2020-04-09 DIAGNOSIS — Z23 Encounter for immunization: Secondary | ICD-10-CM | POA: Diagnosis not present

## 2020-04-09 DIAGNOSIS — N186 End stage renal disease: Secondary | ICD-10-CM | POA: Diagnosis not present

## 2020-04-09 DIAGNOSIS — D509 Iron deficiency anemia, unspecified: Secondary | ICD-10-CM | POA: Diagnosis not present

## 2020-04-09 DIAGNOSIS — Z992 Dependence on renal dialysis: Secondary | ICD-10-CM | POA: Diagnosis not present

## 2020-04-09 DIAGNOSIS — D631 Anemia in chronic kidney disease: Secondary | ICD-10-CM | POA: Diagnosis not present

## 2020-04-09 DIAGNOSIS — N2581 Secondary hyperparathyroidism of renal origin: Secondary | ICD-10-CM | POA: Diagnosis not present

## 2020-04-12 DIAGNOSIS — Z23 Encounter for immunization: Secondary | ICD-10-CM | POA: Diagnosis not present

## 2020-04-12 DIAGNOSIS — N2581 Secondary hyperparathyroidism of renal origin: Secondary | ICD-10-CM | POA: Diagnosis not present

## 2020-04-12 DIAGNOSIS — D631 Anemia in chronic kidney disease: Secondary | ICD-10-CM | POA: Diagnosis not present

## 2020-04-12 DIAGNOSIS — D509 Iron deficiency anemia, unspecified: Secondary | ICD-10-CM | POA: Diagnosis not present

## 2020-04-12 DIAGNOSIS — Z992 Dependence on renal dialysis: Secondary | ICD-10-CM | POA: Diagnosis not present

## 2020-04-12 DIAGNOSIS — N186 End stage renal disease: Secondary | ICD-10-CM | POA: Diagnosis not present

## 2020-04-14 DIAGNOSIS — D509 Iron deficiency anemia, unspecified: Secondary | ICD-10-CM | POA: Diagnosis not present

## 2020-04-14 DIAGNOSIS — N2581 Secondary hyperparathyroidism of renal origin: Secondary | ICD-10-CM | POA: Diagnosis not present

## 2020-04-14 DIAGNOSIS — N186 End stage renal disease: Secondary | ICD-10-CM | POA: Diagnosis not present

## 2020-04-14 DIAGNOSIS — Z992 Dependence on renal dialysis: Secondary | ICD-10-CM | POA: Diagnosis not present

## 2020-04-14 DIAGNOSIS — Z23 Encounter for immunization: Secondary | ICD-10-CM | POA: Diagnosis not present

## 2020-04-14 DIAGNOSIS — D631 Anemia in chronic kidney disease: Secondary | ICD-10-CM | POA: Diagnosis not present

## 2020-04-16 DIAGNOSIS — N2581 Secondary hyperparathyroidism of renal origin: Secondary | ICD-10-CM | POA: Diagnosis not present

## 2020-04-16 DIAGNOSIS — Z23 Encounter for immunization: Secondary | ICD-10-CM | POA: Diagnosis not present

## 2020-04-16 DIAGNOSIS — Z992 Dependence on renal dialysis: Secondary | ICD-10-CM | POA: Diagnosis not present

## 2020-04-16 DIAGNOSIS — D631 Anemia in chronic kidney disease: Secondary | ICD-10-CM | POA: Diagnosis not present

## 2020-04-16 DIAGNOSIS — N186 End stage renal disease: Secondary | ICD-10-CM | POA: Diagnosis not present

## 2020-04-16 DIAGNOSIS — D509 Iron deficiency anemia, unspecified: Secondary | ICD-10-CM | POA: Diagnosis not present

## 2020-04-19 DIAGNOSIS — N186 End stage renal disease: Secondary | ICD-10-CM | POA: Diagnosis not present

## 2020-04-19 DIAGNOSIS — Z23 Encounter for immunization: Secondary | ICD-10-CM | POA: Diagnosis not present

## 2020-04-19 DIAGNOSIS — D631 Anemia in chronic kidney disease: Secondary | ICD-10-CM | POA: Diagnosis not present

## 2020-04-19 DIAGNOSIS — Z992 Dependence on renal dialysis: Secondary | ICD-10-CM | POA: Diagnosis not present

## 2020-04-19 DIAGNOSIS — D509 Iron deficiency anemia, unspecified: Secondary | ICD-10-CM | POA: Diagnosis not present

## 2020-04-19 DIAGNOSIS — N2581 Secondary hyperparathyroidism of renal origin: Secondary | ICD-10-CM | POA: Diagnosis not present

## 2020-04-21 DIAGNOSIS — N2581 Secondary hyperparathyroidism of renal origin: Secondary | ICD-10-CM | POA: Diagnosis not present

## 2020-04-21 DIAGNOSIS — D509 Iron deficiency anemia, unspecified: Secondary | ICD-10-CM | POA: Diagnosis not present

## 2020-04-21 DIAGNOSIS — Z992 Dependence on renal dialysis: Secondary | ICD-10-CM | POA: Diagnosis not present

## 2020-04-21 DIAGNOSIS — N186 End stage renal disease: Secondary | ICD-10-CM | POA: Diagnosis not present

## 2020-04-21 DIAGNOSIS — Z23 Encounter for immunization: Secondary | ICD-10-CM | POA: Diagnosis not present

## 2020-04-21 DIAGNOSIS — D631 Anemia in chronic kidney disease: Secondary | ICD-10-CM | POA: Diagnosis not present

## 2020-04-23 DIAGNOSIS — N186 End stage renal disease: Secondary | ICD-10-CM | POA: Diagnosis not present

## 2020-04-23 DIAGNOSIS — D631 Anemia in chronic kidney disease: Secondary | ICD-10-CM | POA: Diagnosis not present

## 2020-04-23 DIAGNOSIS — N2581 Secondary hyperparathyroidism of renal origin: Secondary | ICD-10-CM | POA: Diagnosis not present

## 2020-04-23 DIAGNOSIS — Z23 Encounter for immunization: Secondary | ICD-10-CM | POA: Diagnosis not present

## 2020-04-23 DIAGNOSIS — D509 Iron deficiency anemia, unspecified: Secondary | ICD-10-CM | POA: Diagnosis not present

## 2020-04-23 DIAGNOSIS — Z992 Dependence on renal dialysis: Secondary | ICD-10-CM | POA: Diagnosis not present

## 2020-04-26 DIAGNOSIS — Z23 Encounter for immunization: Secondary | ICD-10-CM | POA: Diagnosis not present

## 2020-04-26 DIAGNOSIS — N186 End stage renal disease: Secondary | ICD-10-CM | POA: Diagnosis not present

## 2020-04-26 DIAGNOSIS — N2581 Secondary hyperparathyroidism of renal origin: Secondary | ICD-10-CM | POA: Diagnosis not present

## 2020-04-26 DIAGNOSIS — Z992 Dependence on renal dialysis: Secondary | ICD-10-CM | POA: Diagnosis not present

## 2020-04-26 DIAGNOSIS — D631 Anemia in chronic kidney disease: Secondary | ICD-10-CM | POA: Diagnosis not present

## 2020-04-26 DIAGNOSIS — D509 Iron deficiency anemia, unspecified: Secondary | ICD-10-CM | POA: Diagnosis not present

## 2020-04-28 DIAGNOSIS — N2581 Secondary hyperparathyroidism of renal origin: Secondary | ICD-10-CM | POA: Diagnosis not present

## 2020-04-28 DIAGNOSIS — N186 End stage renal disease: Secondary | ICD-10-CM | POA: Diagnosis not present

## 2020-04-28 DIAGNOSIS — D509 Iron deficiency anemia, unspecified: Secondary | ICD-10-CM | POA: Diagnosis not present

## 2020-04-28 DIAGNOSIS — Z23 Encounter for immunization: Secondary | ICD-10-CM | POA: Diagnosis not present

## 2020-04-28 DIAGNOSIS — Z992 Dependence on renal dialysis: Secondary | ICD-10-CM | POA: Diagnosis not present

## 2020-04-28 DIAGNOSIS — D631 Anemia in chronic kidney disease: Secondary | ICD-10-CM | POA: Diagnosis not present
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
## 2020-04-29 ENCOUNTER — Encounter: Payer: Self-pay | Admitting: Gastroenterology

## 2020-04-29 ENCOUNTER — Other Ambulatory Visit: Payer: Self-pay

## 2020-04-29 ENCOUNTER — Telehealth: Payer: Self-pay | Admitting: *Deleted

## 2020-04-29 ENCOUNTER — Ambulatory Visit (INDEPENDENT_AMBULATORY_CARE_PROVIDER_SITE_OTHER): Payer: Medicare Other | Admitting: Gastroenterology

## 2020-04-29 VITALS — BP 175/77 | HR 79 | Temp 97.7°F | Ht 70.0 in | Wt 119.0 lb

## 2020-04-29 DIAGNOSIS — K7460 Unspecified cirrhosis of liver: Secondary | ICD-10-CM | POA: Diagnosis not present

## 2020-04-29 DIAGNOSIS — D539 Nutritional anemia, unspecified: Secondary | ICD-10-CM | POA: Diagnosis not present

## 2020-04-29 NOTE — Assessment & Plan Note (Addendum)
59 year old male history of end-stage renal disease on dialysis 3 days a week, COPD, PVD on cilostazol, HTN, GERD, chronic anemia, chronic alcohol use, and likely alcoholic cirrhosis presenting for follow-up on anemia.  Macrocytic anemia dates back at least to 2010 with hemoglobin in the 9-11 range.  Hemoglobin had declined to 7.2 in December 2020.  Per prior review of nephrology notes, it appears patient receives IV iron and Epogen at dialysis.  Iron panel in December with ferritin 1229, iron 199, saturation 96%.  B12 and folate within normal limits in January 2021.  Most recent hemoglobin on file from April was 7.7.  Underwent EGD/TCS in April for further evaluation.  EGD with low-grade narrowing Schatzki's ring, hiatal hernia, mild portal hypertensive gastropathy, mild gastritis/duodenitis.  Colonoscopy with external/internal hemorrhoids, poor preparation with recommendations to repeat colonoscopy in 1 year. He has had no overt GI bleeding.  No significant upper or lower GI symptoms.  Chronically on omeprazole BID.  No NSAIDs.  Anemia is likely multifactorial with anemia of chronic disease in the setting of CKD and COPD, chronic alcohol use, and possible intermittent oozing from portal hypertensive gastropathy.   Plan: Update CBC Proceed with TCS with propofol with Dr. Abbey Chatters in the near future. The risks, benefits, and alternatives have been discussed with the patient in detail. The patient states understanding and desires to proceed.  ASA III Due to poor prep, patient will have 2 days of clear liquids and take Linzess 145 mcg daily x4 days prior to colon prep. (Hesitant to complete an extra bowel prep due to fluid restriction in the setting of ESRD.) Counseled on working towards abstinence of alcohol. Follow-up after colonoscopy.

## 2020-04-29 NOTE — Telephone Encounter (Signed)
Called pt. Made aware [place] appt scheduled for 10/28, 10:30am, arrival 10:15am, npo midnight. He voiced understanding.  Patient also scheduled for TCS w/ propofol, ASA 3, Dr. Abbey Chatters on 11/30 at 9:30am. Aware will mail prep instrucitons with pre-op/covid test appt. Confirmed mailing address.

## 2020-04-29 NOTE — Patient Instructions (Addendum)
Please have labs completed at [facility] or [place].   Please have ultrasound completed at [facility].  We will get you scheduled for a colonoscopy with Dr. Abbey Chatters in the near future. You will take Linzess 145 mcg x4 days prior to your colon prep to help get your bowels moving.  You will need to be on clear liquids for 2 days prior to your colonoscopy.   For Cirrhosis:  Please work towards abstinence of alcohol.  No more than 2000 mg of Tylenol per 24 hours. No more than 2000 mg of sodium in 24 hours. Monitor for swelling in your abdomen or lower extremities, significant bruising or bright red blood per rectum or black stools, yellowing of your eyes, or mental status changes and let us know if this occurs.  We will plan to see back after your colonoscopy.  Aliene Altes, PA-C [facility]

## 2020-04-29 NOTE — Assessment & Plan Note (Signed)
59 year old male with history of COPD, ESRD on dialysis, PVD, chronic alcohol use, and history of intranasal cocaine use about 20 years ago who was recently found to have questionable fine liver surface irregularity concerning for cirrhosis noted on CT A/P 11/04/2019.  Spleen within normal limits.  Recent EGD April 2021 with no varices but evidence of mild portal hypertensive gastropathy.  LFTs within normal limits.  Platelets have been low, but were within normal limits on most recent labs in April.  Hepatitis B surface antigen negative March 2021.  He continues drinking about 12 beer per week.  No signs or symptoms of decompensated liver disease.  Considering portal hypertensive gastropathy on EGD, I suspect patient likely has cirrhosis.  Cirrhosis is likely secondary to chronic alcohol use.  Notably, he reports he used to drink about a 24 pack a week for many years.  Cannot rule out hepatitis C. We will also evaluate/screen for hemochromatosis.  Plan: Labs: CBC, CMP, INR, AFP, iron panel with ferritin, hepatitis A antibody total, hepatitis B surface antibody, hepatitis B core antibody total, hepatitis C antibody. Obtain ultrasound with elastography. Work towards abstinence of alcohol.  No more than 2000 mg of Tylenol per 24 hours. No more than 2000 mg of sodium in 24 hours. Monitor for swelling in abdomen or lower extremities, significant bruising or bright red blood per rectum or black stools, yellowing of eyes, or mental status changes and let us know if this occurs.  Follow-up after colonoscopy.

## 2020-04-30 ENCOUNTER — Encounter: Payer: Self-pay | Admitting: *Deleted

## 2020-04-30 DIAGNOSIS — D631 Anemia in chronic kidney disease: Secondary | ICD-10-CM | POA: Diagnosis not present

## 2020-04-30 DIAGNOSIS — Z23 Encounter for immunization: Secondary | ICD-10-CM | POA: Diagnosis not present

## 2020-04-30 DIAGNOSIS — D509 Iron deficiency anemia, unspecified: Secondary | ICD-10-CM | POA: Diagnosis not present

## 2020-04-30 DIAGNOSIS — Z992 Dependence on renal dialysis: Secondary | ICD-10-CM | POA: Diagnosis not present

## 2020-04-30 DIAGNOSIS — N186 End stage renal disease: Secondary | ICD-10-CM | POA: Diagnosis not present

## 2020-04-30 DIAGNOSIS — N2581 Secondary hyperparathyroidism of renal origin: Secondary | ICD-10-CM | POA: Diagnosis not present

## 2020-04-30 NOTE — Progress Notes (Signed)
Cc'ed to pcp °

## 2020-05-03 DIAGNOSIS — Z23 Encounter for immunization: Secondary | ICD-10-CM | POA: Diagnosis not present

## 2020-05-03 DIAGNOSIS — N186 End stage renal disease: Secondary | ICD-10-CM | POA: Diagnosis not present

## 2020-05-03 DIAGNOSIS — D509 Iron deficiency anemia, unspecified: Secondary | ICD-10-CM | POA: Diagnosis not present

## 2020-05-03 DIAGNOSIS — D631 Anemia in chronic kidney disease: Secondary | ICD-10-CM | POA: Diagnosis not present

## 2020-05-03 DIAGNOSIS — Z992 Dependence on renal dialysis: Secondary | ICD-10-CM | POA: Diagnosis not present

## 2020-05-03 DIAGNOSIS — N2581 Secondary hyperparathyroidism of renal origin: Secondary | ICD-10-CM | POA: Diagnosis not present

## 2020-05-06 ENCOUNTER — Ambulatory Visit (HOSPITAL_COMMUNITY): Payer: Medicare Other

## 2020-05-07 DIAGNOSIS — N186 End stage renal disease: Secondary | ICD-10-CM | POA: Diagnosis not present

## 2020-05-07 DIAGNOSIS — N2581 Secondary hyperparathyroidism of renal origin: Secondary | ICD-10-CM | POA: Diagnosis not present

## 2020-05-07 DIAGNOSIS — D509 Iron deficiency anemia, unspecified: Secondary | ICD-10-CM | POA: Diagnosis not present

## 2020-05-07 DIAGNOSIS — Z23 Encounter for immunization: Secondary | ICD-10-CM | POA: Diagnosis not present

## 2020-05-07 DIAGNOSIS — D631 Anemia in chronic kidney disease: Secondary | ICD-10-CM | POA: Diagnosis not present

## 2020-05-07 DIAGNOSIS — Z992 Dependence on renal dialysis: Secondary | ICD-10-CM | POA: Diagnosis not present

## 2020-05-09 DIAGNOSIS — Z992 Dependence on renal dialysis: Secondary | ICD-10-CM | POA: Diagnosis not present

## 2020-05-09 DIAGNOSIS — N186 End stage renal disease: Secondary | ICD-10-CM | POA: Diagnosis not present

## 2020-05-12 DIAGNOSIS — N186 End stage renal disease: Secondary | ICD-10-CM | POA: Diagnosis not present

## 2020-05-12 DIAGNOSIS — D509 Iron deficiency anemia, unspecified: Secondary | ICD-10-CM | POA: Diagnosis not present

## 2020-05-12 DIAGNOSIS — D631 Anemia in chronic kidney disease: Secondary | ICD-10-CM | POA: Diagnosis not present

## 2020-05-12 DIAGNOSIS — Z992 Dependence on renal dialysis: Secondary | ICD-10-CM | POA: Diagnosis not present

## 2020-05-12 DIAGNOSIS — N2581 Secondary hyperparathyroidism of renal origin: Secondary | ICD-10-CM | POA: Diagnosis not present

## 2020-05-14 DIAGNOSIS — D509 Iron deficiency anemia, unspecified: Secondary | ICD-10-CM | POA: Diagnosis not present

## 2020-05-14 DIAGNOSIS — D631 Anemia in chronic kidney disease: Secondary | ICD-10-CM | POA: Diagnosis not present

## 2020-05-14 DIAGNOSIS — N2581 Secondary hyperparathyroidism of renal origin: Secondary | ICD-10-CM | POA: Diagnosis not present

## 2020-05-14 DIAGNOSIS — Z992 Dependence on renal dialysis: Secondary | ICD-10-CM | POA: Diagnosis not present

## 2020-05-14 DIAGNOSIS — N186 End stage renal disease: Secondary | ICD-10-CM | POA: Diagnosis not present

## 2020-05-17 DIAGNOSIS — D631 Anemia in chronic kidney disease: Secondary | ICD-10-CM | POA: Diagnosis not present

## 2020-05-17 DIAGNOSIS — N186 End stage renal disease: Secondary | ICD-10-CM | POA: Diagnosis not present

## 2020-05-17 DIAGNOSIS — N2581 Secondary hyperparathyroidism of renal origin: Secondary | ICD-10-CM | POA: Diagnosis not present

## 2020-05-17 DIAGNOSIS — D509 Iron deficiency anemia, unspecified: Secondary | ICD-10-CM | POA: Diagnosis not present

## 2020-05-17 DIAGNOSIS — Z992 Dependence on renal dialysis: Secondary | ICD-10-CM | POA: Diagnosis not present

## 2020-05-18 ENCOUNTER — Other Ambulatory Visit: Payer: Self-pay

## 2020-05-18 ENCOUNTER — Ambulatory Visit (HOSPITAL_COMMUNITY)
Admission: RE | Admit: 2020-05-18 | Discharge: 2020-05-18 | Disposition: A | Discharge status: Home / Self Care | Payer: Medicare Other | Source: Ambulatory Visit | Attending: Gastroenterology | Admitting: Gastroenterology

## 2020-05-18 DIAGNOSIS — K760 Fatty (change of) liver, not elsewhere classified: Secondary | ICD-10-CM | POA: Diagnosis not present

## 2020-05-18 DIAGNOSIS — K7460 Unspecified cirrhosis of liver: Secondary | ICD-10-CM | POA: Diagnosis not present

## 2020-05-19 DIAGNOSIS — D631 Anemia in chronic kidney disease: Secondary | ICD-10-CM | POA: Diagnosis not present

## 2020-05-19 DIAGNOSIS — N2581 Secondary hyperparathyroidism of renal origin: Secondary | ICD-10-CM | POA: Diagnosis not present

## 2020-05-19 DIAGNOSIS — D509 Iron deficiency anemia, unspecified: Secondary | ICD-10-CM | POA: Diagnosis not present

## 2020-05-19 DIAGNOSIS — Z992 Dependence on renal dialysis: Secondary | ICD-10-CM | POA: Diagnosis not present

## 2020-05-19 DIAGNOSIS — N186 End stage renal disease: Secondary | ICD-10-CM | POA: Diagnosis not present

## 2020-05-21 DIAGNOSIS — D509 Iron deficiency anemia, unspecified: Secondary | ICD-10-CM | POA: Diagnosis not present

## 2020-05-21 DIAGNOSIS — Z992 Dependence on renal dialysis: Secondary | ICD-10-CM | POA: Diagnosis not present

## 2020-05-21 DIAGNOSIS — D631 Anemia in chronic kidney disease: Secondary | ICD-10-CM | POA: Diagnosis not present

## 2020-05-21 DIAGNOSIS — N186 End stage renal disease: Secondary | ICD-10-CM | POA: Diagnosis not present

## 2020-05-21 DIAGNOSIS — N2581 Secondary hyperparathyroidism of renal origin: Secondary | ICD-10-CM | POA: Diagnosis not present

## 2020-05-24 DIAGNOSIS — Z992 Dependence on renal dialysis: Secondary | ICD-10-CM | POA: Diagnosis not present

## 2020-05-24 DIAGNOSIS — N2581 Secondary hyperparathyroidism of renal origin: Secondary | ICD-10-CM | POA: Diagnosis not present

## 2020-05-24 DIAGNOSIS — D509 Iron deficiency anemia, unspecified: Secondary | ICD-10-CM | POA: Diagnosis not present

## 2020-05-24 DIAGNOSIS — D631 Anemia in chronic kidney disease: Secondary | ICD-10-CM | POA: Diagnosis not present

## 2020-05-24 DIAGNOSIS — N186 End stage renal disease: Secondary | ICD-10-CM | POA: Diagnosis not present

## 2020-05-26 DIAGNOSIS — D509 Iron deficiency anemia, unspecified: Secondary | ICD-10-CM | POA: Diagnosis not present

## 2020-05-26 DIAGNOSIS — N186 End stage renal disease: Secondary | ICD-10-CM | POA: Diagnosis not present

## 2020-05-26 DIAGNOSIS — Z992 Dependence on renal dialysis: Secondary | ICD-10-CM | POA: Diagnosis not present

## 2020-05-26 DIAGNOSIS — N2581 Secondary hyperparathyroidism of renal origin: Secondary | ICD-10-CM | POA: Diagnosis not present

## 2020-05-26 DIAGNOSIS — D631 Anemia in chronic kidney disease: Secondary | ICD-10-CM | POA: Diagnosis not present

## 2020-05-28 DIAGNOSIS — D509 Iron deficiency anemia, unspecified: Secondary | ICD-10-CM | POA: Diagnosis not present

## 2020-05-28 DIAGNOSIS — N2581 Secondary hyperparathyroidism of renal origin: Secondary | ICD-10-CM | POA: Diagnosis not present

## 2020-05-28 DIAGNOSIS — N186 End stage renal disease: Secondary | ICD-10-CM | POA: Diagnosis not present

## 2020-05-28 DIAGNOSIS — Z992 Dependence on renal dialysis: Secondary | ICD-10-CM | POA: Diagnosis not present

## 2020-05-28 DIAGNOSIS — D631 Anemia in chronic kidney disease: Secondary | ICD-10-CM | POA: Diagnosis not present

## 2020-05-31 DIAGNOSIS — N186 End stage renal disease: Secondary | ICD-10-CM | POA: Diagnosis not present

## 2020-05-31 DIAGNOSIS — N2581 Secondary hyperparathyroidism of renal origin: Secondary | ICD-10-CM | POA: Diagnosis not present

## 2020-05-31 DIAGNOSIS — Z992 Dependence on renal dialysis: Secondary | ICD-10-CM | POA: Diagnosis not present

## 2020-05-31 DIAGNOSIS — D509 Iron deficiency anemia, unspecified: Secondary | ICD-10-CM | POA: Diagnosis not present

## 2020-05-31 DIAGNOSIS — D631 Anemia in chronic kidney disease: Secondary | ICD-10-CM | POA: Diagnosis not present

## 2020-05-31 NOTE — Progress Notes (Signed)
Mr. Luke Mueller have called  and reminded you to get your labs completed from 04/29/2020 as the Dr had ordered. If you have not done so please have it done. This is critical to your care here as a patient.     Thank you Floria Raveling, CMA

## 2020-06-01 ENCOUNTER — Other Ambulatory Visit: Payer: Self-pay

## 2020-06-01 ENCOUNTER — Encounter (HOSPITAL_COMMUNITY)
Admission: RE | Admit: 2020-06-01 | Discharge: 2020-06-01 | Disposition: A | Discharge status: Home / Self Care | Payer: Medicare Other | Source: Ambulatory Visit | Attending: Internal Medicine | Admitting: Internal Medicine

## 2020-06-01 ENCOUNTER — Telehealth: Payer: Self-pay | Admitting: Internal Medicine

## 2020-06-01 NOTE — Telephone Encounter (Signed)
PATIENT DID NOT GO TO PRE OP, NEEDS TO RESCHEDULE PROCEDURE   STATED HE IS SICK

## 2020-06-02 DIAGNOSIS — D509 Iron deficiency anemia, unspecified: Secondary | ICD-10-CM | POA: Diagnosis not present

## 2020-06-02 DIAGNOSIS — N186 End stage renal disease: Secondary | ICD-10-CM | POA: Diagnosis not present

## 2020-06-02 DIAGNOSIS — Z992 Dependence on renal dialysis: Secondary | ICD-10-CM | POA: Diagnosis not present

## 2020-06-02 DIAGNOSIS — N2581 Secondary hyperparathyroidism of renal origin: Secondary | ICD-10-CM | POA: Diagnosis not present

## 2020-06-02 DIAGNOSIS — D631 Anemia in chronic kidney disease: Secondary | ICD-10-CM | POA: Diagnosis not present

## 2020-06-02 NOTE — Progress Notes (Signed)
Mr [place] mom needs procedure time later on 06/08/2020 due to transportation issues.  Messaged Mindy at [address] office to change time if possible

## 2020-06-02 NOTE — Telephone Encounter (Signed)
Tried to call pt, no answer, LMOAM for return call. 

## 2020-06-04 DIAGNOSIS — N186 End stage renal disease: Secondary | ICD-10-CM | POA: Diagnosis not present

## 2020-06-04 DIAGNOSIS — Z992 Dependence on renal dialysis: Secondary | ICD-10-CM | POA: Diagnosis not present

## 2020-06-04 DIAGNOSIS — N2581 Secondary hyperparathyroidism of renal origin: Secondary | ICD-10-CM | POA: Diagnosis not present

## 2020-06-04 DIAGNOSIS — D509 Iron deficiency anemia, unspecified: Secondary | ICD-10-CM | POA: Diagnosis not present

## 2020-06-04 DIAGNOSIS — D631 Anemia in chronic kidney disease: Secondary | ICD-10-CM | POA: Diagnosis not present

## 2020-06-07 ENCOUNTER — Other Ambulatory Visit (HOSPITAL_COMMUNITY)
Admission: RE | Admit: 2020-06-07 | Discharge: 2020-06-07 | Disposition: A | Discharge status: Home / Self Care | Payer: Medicare Other | Source: Ambulatory Visit | Attending: Internal Medicine | Admitting: Internal Medicine

## 2020-06-07 DIAGNOSIS — D631 Anemia in chronic kidney disease: Secondary | ICD-10-CM | POA: Diagnosis not present

## 2020-06-07 DIAGNOSIS — N2581 Secondary hyperparathyroidism of renal origin: Secondary | ICD-10-CM | POA: Diagnosis not present

## 2020-06-07 DIAGNOSIS — D509 Iron deficiency anemia, unspecified: Secondary | ICD-10-CM | POA: Diagnosis not present

## 2020-06-07 DIAGNOSIS — Z992 Dependence on renal dialysis: Secondary | ICD-10-CM | POA: Diagnosis not present

## 2020-06-07 DIAGNOSIS — N186 End stage renal disease: Secondary | ICD-10-CM | POA: Diagnosis not present

## 2020-06-07 NOTE — Telephone Encounter (Signed)
Called pt, TCS rescheduled to 07/20/20 at 11:15am. Pt said he didn't have his procedure instructions or Linzess samples. Informed him to come by office to pick up more Linzess samples needed prior to TCS.  Informed endo scheduler. Will receive pre-op phone call 07/15/20 d/t dialysis. Covid test 07/19/20. Appt letter and new instructions mailed to pt. Linzess 158mcg samples placed at front desk.

## 2020-06-08 DIAGNOSIS — N186 End stage renal disease: Secondary | ICD-10-CM | POA: Diagnosis not present

## 2020-06-08 DIAGNOSIS — Z992 Dependence on renal dialysis: Secondary | ICD-10-CM | POA: Diagnosis not present

## 2020-06-09 DIAGNOSIS — N186 End stage renal disease: Secondary | ICD-10-CM | POA: Diagnosis not present

## 2020-06-09 DIAGNOSIS — D631 Anemia in chronic kidney disease: Secondary | ICD-10-CM | POA: Diagnosis not present

## 2020-06-09 DIAGNOSIS — Z992 Dependence on renal dialysis: Secondary | ICD-10-CM | POA: Diagnosis not present

## 2020-06-09 DIAGNOSIS — N2581 Secondary hyperparathyroidism of renal origin: Secondary | ICD-10-CM | POA: Diagnosis not present

## 2020-06-09 DIAGNOSIS — D509 Iron deficiency anemia, unspecified: Secondary | ICD-10-CM | POA: Diagnosis not present

## 2020-06-11 DIAGNOSIS — D631 Anemia in chronic kidney disease: Secondary | ICD-10-CM | POA: Diagnosis not present

## 2020-06-11 DIAGNOSIS — D509 Iron deficiency anemia, unspecified: Secondary | ICD-10-CM | POA: Diagnosis not present

## 2020-06-11 DIAGNOSIS — N186 End stage renal disease: Secondary | ICD-10-CM | POA: Diagnosis not present

## 2020-06-11 DIAGNOSIS — N2581 Secondary hyperparathyroidism of renal origin: Secondary | ICD-10-CM | POA: Diagnosis not present

## 2020-06-11 DIAGNOSIS — Z992 Dependence on renal dialysis: Secondary | ICD-10-CM | POA: Diagnosis not present

## 2020-06-14 DIAGNOSIS — D509 Iron deficiency anemia, unspecified: Secondary | ICD-10-CM | POA: Diagnosis not present

## 2020-06-14 DIAGNOSIS — N2581 Secondary hyperparathyroidism of renal origin: Secondary | ICD-10-CM | POA: Diagnosis not present

## 2020-06-14 DIAGNOSIS — Z992 Dependence on renal dialysis: Secondary | ICD-10-CM | POA: Diagnosis not present

## 2020-06-14 DIAGNOSIS — D631 Anemia in chronic kidney disease: Secondary | ICD-10-CM | POA: Diagnosis not present

## 2020-06-14 DIAGNOSIS — N186 End stage renal disease: Secondary | ICD-10-CM | POA: Diagnosis not present

## 2020-06-15 DIAGNOSIS — N186 End stage renal disease: Secondary | ICD-10-CM | POA: Diagnosis not present

## 2020-06-15 DIAGNOSIS — I10 Essential (primary) hypertension: Secondary | ICD-10-CM | POA: Diagnosis not present

## 2020-06-15 DIAGNOSIS — J449 Chronic obstructive pulmonary disease, unspecified: Secondary | ICD-10-CM | POA: Diagnosis not present

## 2020-06-15 DIAGNOSIS — F17210 Nicotine dependence, cigarettes, uncomplicated: Secondary | ICD-10-CM | POA: Diagnosis not present

## 2020-06-15 DIAGNOSIS — Z1389 Encounter for screening for other disorder: Secondary | ICD-10-CM | POA: Diagnosis not present

## 2020-06-15 DIAGNOSIS — E43 Unspecified severe protein-calorie malnutrition: Secondary | ICD-10-CM | POA: Diagnosis not present

## 2020-06-15 DIAGNOSIS — Z1331 Encounter for screening for depression: Secondary | ICD-10-CM | POA: Diagnosis not present

## 2020-06-16 DIAGNOSIS — N2581 Secondary hyperparathyroidism of renal origin: Secondary | ICD-10-CM | POA: Diagnosis not present

## 2020-06-16 DIAGNOSIS — Z992 Dependence on renal dialysis: Secondary | ICD-10-CM | POA: Diagnosis not present

## 2020-06-16 DIAGNOSIS — N186 End stage renal disease: Secondary | ICD-10-CM | POA: Diagnosis not present

## 2020-06-16 DIAGNOSIS — D509 Iron deficiency anemia, unspecified: Secondary | ICD-10-CM | POA: Diagnosis not present

## 2020-06-16 DIAGNOSIS — D631 Anemia in chronic kidney disease: Secondary | ICD-10-CM | POA: Diagnosis not present

## 2020-06-18 DIAGNOSIS — N2581 Secondary hyperparathyroidism of renal origin: Secondary | ICD-10-CM | POA: Diagnosis not present

## 2020-06-18 DIAGNOSIS — D631 Anemia in chronic kidney disease: Secondary | ICD-10-CM | POA: Diagnosis not present

## 2020-06-18 DIAGNOSIS — Z992 Dependence on renal dialysis: Secondary | ICD-10-CM | POA: Diagnosis not present

## 2020-06-18 DIAGNOSIS — N186 End stage renal disease: Secondary | ICD-10-CM | POA: Diagnosis not present

## 2020-06-18 DIAGNOSIS — D509 Iron deficiency anemia, unspecified: Secondary | ICD-10-CM | POA: Diagnosis not present

## 2020-06-21 DIAGNOSIS — D509 Iron deficiency anemia, unspecified: Secondary | ICD-10-CM | POA: Diagnosis not present

## 2020-06-21 DIAGNOSIS — N2581 Secondary hyperparathyroidism of renal origin: Secondary | ICD-10-CM | POA: Diagnosis not present

## 2020-06-21 DIAGNOSIS — Z992 Dependence on renal dialysis: Secondary | ICD-10-CM | POA: Diagnosis not present

## 2020-06-21 DIAGNOSIS — N186 End stage renal disease: Secondary | ICD-10-CM | POA: Diagnosis not present

## 2020-06-21 DIAGNOSIS — D631 Anemia in chronic kidney disease: Secondary | ICD-10-CM | POA: Diagnosis not present

## 2020-06-23 DIAGNOSIS — N2581 Secondary hyperparathyroidism of renal origin: Secondary | ICD-10-CM | POA: Diagnosis not present

## 2020-06-23 DIAGNOSIS — D631 Anemia in chronic kidney disease: Secondary | ICD-10-CM | POA: Diagnosis not present

## 2020-06-23 DIAGNOSIS — D509 Iron deficiency anemia, unspecified: Secondary | ICD-10-CM | POA: Diagnosis not present

## 2020-06-23 DIAGNOSIS — N186 End stage renal disease: Secondary | ICD-10-CM | POA: Diagnosis not present

## 2020-06-23 DIAGNOSIS — Z992 Dependence on renal dialysis: Secondary | ICD-10-CM | POA: Diagnosis not present

## 2020-06-25 DIAGNOSIS — N2581 Secondary hyperparathyroidism of renal origin: Secondary | ICD-10-CM | POA: Diagnosis not present

## 2020-06-25 DIAGNOSIS — N186 End stage renal disease: Secondary | ICD-10-CM | POA: Diagnosis not present

## 2020-06-25 DIAGNOSIS — D631 Anemia in chronic kidney disease: Secondary | ICD-10-CM | POA: Diagnosis not present

## 2020-06-25 DIAGNOSIS — D509 Iron deficiency anemia, unspecified: Secondary | ICD-10-CM | POA: Diagnosis not present

## 2020-06-25 DIAGNOSIS — Z992 Dependence on renal dialysis: Secondary | ICD-10-CM | POA: Diagnosis not present

## 2020-06-28 DIAGNOSIS — D509 Iron deficiency anemia, unspecified: Secondary | ICD-10-CM | POA: Diagnosis not present

## 2020-06-28 DIAGNOSIS — N2581 Secondary hyperparathyroidism of renal origin: Secondary | ICD-10-CM | POA: Diagnosis not present

## 2020-06-28 DIAGNOSIS — Z992 Dependence on renal dialysis: Secondary | ICD-10-CM | POA: Diagnosis not present

## 2020-06-28 DIAGNOSIS — D631 Anemia in chronic kidney disease: Secondary | ICD-10-CM | POA: Diagnosis not present

## 2020-06-28 DIAGNOSIS — N186 End stage renal disease: Secondary | ICD-10-CM | POA: Diagnosis not present

## 2020-06-30 DIAGNOSIS — D631 Anemia in chronic kidney disease: Secondary | ICD-10-CM | POA: Diagnosis not present

## 2020-06-30 DIAGNOSIS — N186 End stage renal disease: Secondary | ICD-10-CM | POA: Diagnosis not present

## 2020-06-30 DIAGNOSIS — Z992 Dependence on renal dialysis: Secondary | ICD-10-CM | POA: Diagnosis not present

## 2020-06-30 DIAGNOSIS — D509 Iron deficiency anemia, unspecified: Secondary | ICD-10-CM | POA: Diagnosis not present

## 2020-06-30 DIAGNOSIS — N2581 Secondary hyperparathyroidism of renal origin: Secondary | ICD-10-CM | POA: Diagnosis not present

## 2020-07-02 DIAGNOSIS — D631 Anemia in chronic kidney disease: Secondary | ICD-10-CM | POA: Diagnosis not present

## 2020-07-02 DIAGNOSIS — N2581 Secondary hyperparathyroidism of renal origin: Secondary | ICD-10-CM | POA: Diagnosis not present

## 2020-07-02 DIAGNOSIS — Z992 Dependence on renal dialysis: Secondary | ICD-10-CM | POA: Diagnosis not present

## 2020-07-02 DIAGNOSIS — N186 End stage renal disease: Secondary | ICD-10-CM | POA: Diagnosis not present

## 2020-07-02 DIAGNOSIS — D509 Iron deficiency anemia, unspecified: Secondary | ICD-10-CM | POA: Diagnosis not present

## 2020-07-05 DIAGNOSIS — D509 Iron deficiency anemia, unspecified: Secondary | ICD-10-CM | POA: Diagnosis not present

## 2020-07-05 DIAGNOSIS — N2581 Secondary hyperparathyroidism of renal origin: Secondary | ICD-10-CM | POA: Diagnosis not present

## 2020-07-05 DIAGNOSIS — Z992 Dependence on renal dialysis: Secondary | ICD-10-CM | POA: Diagnosis not present

## 2020-07-05 DIAGNOSIS — D631 Anemia in chronic kidney disease: Secondary | ICD-10-CM | POA: Diagnosis not present

## 2020-07-05 DIAGNOSIS — N186 End stage renal disease: Secondary | ICD-10-CM | POA: Diagnosis not present

## 2020-07-07 DIAGNOSIS — D509 Iron deficiency anemia, unspecified: Secondary | ICD-10-CM | POA: Diagnosis not present

## 2020-07-07 DIAGNOSIS — N2581 Secondary hyperparathyroidism of renal origin: Secondary | ICD-10-CM | POA: Diagnosis not present

## 2020-07-07 DIAGNOSIS — Z992 Dependence on renal dialysis: Secondary | ICD-10-CM | POA: Diagnosis not present

## 2020-07-07 DIAGNOSIS — D631 Anemia in chronic kidney disease: Secondary | ICD-10-CM | POA: Diagnosis not present

## 2020-07-07 DIAGNOSIS — N186 End stage renal disease: Secondary | ICD-10-CM | POA: Diagnosis not present

## 2020-07-09 DIAGNOSIS — N186 End stage renal disease: Secondary | ICD-10-CM | POA: Diagnosis not present

## 2020-07-09 DIAGNOSIS — D509 Iron deficiency anemia, unspecified: Secondary | ICD-10-CM | POA: Diagnosis not present

## 2020-07-09 DIAGNOSIS — N2581 Secondary hyperparathyroidism of renal origin: Secondary | ICD-10-CM | POA: Diagnosis not present

## 2020-07-09 DIAGNOSIS — Z992 Dependence on renal dialysis: Secondary | ICD-10-CM | POA: Diagnosis not present

## 2020-07-09 DIAGNOSIS — D631 Anemia in chronic kidney disease: Secondary | ICD-10-CM | POA: Diagnosis not present

## 2020-07-12 ENCOUNTER — Encounter (HOSPITAL_COMMUNITY): Payer: Self-pay

## 2020-07-12 ENCOUNTER — Other Ambulatory Visit: Payer: Self-pay

## 2020-07-12 ENCOUNTER — Inpatient Hospital Stay (HOSPITAL_COMMUNITY)
Admission: EM | Admit: 2020-07-12 | Discharge: 2020-07-16 | DRG: 193 | Disposition: A | Discharge status: Home / Self Care | Payer: Medicare HMO | Attending: Family Medicine | Admitting: Family Medicine

## 2020-07-12 ENCOUNTER — Emergency Department (HOSPITAL_COMMUNITY): Payer: Medicare HMO

## 2020-07-12 DIAGNOSIS — Z8249 Family history of ischemic heart disease and other diseases of the circulatory system: Secondary | ICD-10-CM

## 2020-07-12 DIAGNOSIS — Z888 Allergy status to other drugs, medicaments and biological substances status: Secondary | ICD-10-CM

## 2020-07-12 DIAGNOSIS — E871 Hypo-osmolality and hyponatremia: Secondary | ICD-10-CM | POA: Diagnosis present

## 2020-07-12 DIAGNOSIS — F10231 Alcohol dependence with withdrawal delirium: Secondary | ICD-10-CM | POA: Diagnosis not present

## 2020-07-12 DIAGNOSIS — K219 Gastro-esophageal reflux disease without esophagitis: Secondary | ICD-10-CM | POA: Diagnosis present

## 2020-07-12 DIAGNOSIS — D631 Anemia in chronic kidney disease: Secondary | ICD-10-CM | POA: Diagnosis not present

## 2020-07-12 DIAGNOSIS — J9601 Acute respiratory failure with hypoxia: Principal | ICD-10-CM | POA: Diagnosis present

## 2020-07-12 DIAGNOSIS — N186 End stage renal disease: Secondary | ICD-10-CM | POA: Diagnosis present

## 2020-07-12 DIAGNOSIS — R9431 Abnormal electrocardiogram [ECG] [EKG]: Secondary | ICD-10-CM | POA: Diagnosis not present

## 2020-07-12 DIAGNOSIS — J440 Chronic obstructive pulmonary disease with acute lower respiratory infection: Secondary | ICD-10-CM | POA: Diagnosis not present

## 2020-07-12 DIAGNOSIS — J320 Chronic maxillary sinusitis: Secondary | ICD-10-CM | POA: Diagnosis not present

## 2020-07-12 DIAGNOSIS — J441 Chronic obstructive pulmonary disease with (acute) exacerbation: Secondary | ICD-10-CM | POA: Diagnosis present

## 2020-07-12 DIAGNOSIS — F17210 Nicotine dependence, cigarettes, uncomplicated: Secondary | ICD-10-CM | POA: Diagnosis present

## 2020-07-12 DIAGNOSIS — J449 Chronic obstructive pulmonary disease, unspecified: Secondary | ICD-10-CM | POA: Diagnosis present

## 2020-07-12 DIAGNOSIS — R0600 Dyspnea, unspecified: Secondary | ICD-10-CM

## 2020-07-12 DIAGNOSIS — I120 Hypertensive chronic kidney disease with stage 5 chronic kidney disease or end stage renal disease: Secondary | ICD-10-CM | POA: Diagnosis not present

## 2020-07-12 DIAGNOSIS — R4182 Altered mental status, unspecified: Secondary | ICD-10-CM | POA: Diagnosis not present

## 2020-07-12 DIAGNOSIS — Z79899 Other long term (current) drug therapy: Secondary | ICD-10-CM | POA: Diagnosis not present

## 2020-07-12 DIAGNOSIS — Z800 Family history of malignant neoplasm of digestive organs: Secondary | ICD-10-CM

## 2020-07-12 DIAGNOSIS — I161 Hypertensive emergency: Secondary | ICD-10-CM | POA: Diagnosis present

## 2020-07-12 DIAGNOSIS — Z20822 Contact with and (suspected) exposure to covid-19: Secondary | ICD-10-CM | POA: Diagnosis present

## 2020-07-12 DIAGNOSIS — R069 Unspecified abnormalities of breathing: Secondary | ICD-10-CM | POA: Diagnosis not present

## 2020-07-12 DIAGNOSIS — F10931 Alcohol use, unspecified with withdrawal delirium: Secondary | ICD-10-CM | POA: Diagnosis present

## 2020-07-12 DIAGNOSIS — J811 Chronic pulmonary edema: Secondary | ICD-10-CM | POA: Diagnosis present

## 2020-07-12 DIAGNOSIS — E875 Hyperkalemia: Secondary | ICD-10-CM | POA: Diagnosis present

## 2020-07-12 DIAGNOSIS — N2581 Secondary hyperparathyroidism of renal origin: Secondary | ICD-10-CM | POA: Diagnosis not present

## 2020-07-12 DIAGNOSIS — I739 Peripheral vascular disease, unspecified: Secondary | ICD-10-CM | POA: Diagnosis present

## 2020-07-12 DIAGNOSIS — J322 Chronic ethmoidal sinusitis: Secondary | ICD-10-CM | POA: Diagnosis not present

## 2020-07-12 DIAGNOSIS — E8809 Other disorders of plasma-protein metabolism, not elsewhere classified: Secondary | ICD-10-CM

## 2020-07-12 DIAGNOSIS — Z825 Family history of asthma and other chronic lower respiratory diseases: Secondary | ICD-10-CM | POA: Diagnosis not present

## 2020-07-12 DIAGNOSIS — J189 Pneumonia, unspecified organism: Secondary | ICD-10-CM | POA: Diagnosis present

## 2020-07-12 DIAGNOSIS — R0902 Hypoxemia: Secondary | ICD-10-CM | POA: Diagnosis not present

## 2020-07-12 DIAGNOSIS — R441 Visual hallucinations: Secondary | ICD-10-CM | POA: Diagnosis not present

## 2020-07-12 DIAGNOSIS — G312 Degeneration of nervous system due to alcohol: Secondary | ICD-10-CM | POA: Diagnosis present

## 2020-07-12 DIAGNOSIS — J439 Emphysema, unspecified: Secondary | ICD-10-CM | POA: Diagnosis not present

## 2020-07-12 DIAGNOSIS — Z7951 Long term (current) use of inhaled steroids: Secondary | ICD-10-CM

## 2020-07-12 DIAGNOSIS — Z992 Dependence on renal dialysis: Secondary | ICD-10-CM

## 2020-07-12 DIAGNOSIS — E8770 Fluid overload, unspecified: Secondary | ICD-10-CM | POA: Diagnosis present

## 2020-07-12 DIAGNOSIS — I10 Essential (primary) hypertension: Secondary | ICD-10-CM | POA: Diagnosis not present

## 2020-07-12 DIAGNOSIS — J90 Pleural effusion, not elsewhere classified: Secondary | ICD-10-CM | POA: Diagnosis not present

## 2020-07-12 DIAGNOSIS — G9389 Other specified disorders of brain: Secondary | ICD-10-CM | POA: Diagnosis not present

## 2020-07-12 DIAGNOSIS — R0602 Shortness of breath: Secondary | ICD-10-CM | POA: Diagnosis not present

## 2020-07-12 LAB — COMPREHENSIVE METABOLIC PANEL
ALT: 17 U/L (ref 0–44)
AST: 27 U/L (ref 15–41)
Albumin: 3.2 g/dL — ABNORMAL LOW (ref 3.5–5.0)
Alkaline Phosphatase: 167 U/L — ABNORMAL HIGH (ref 38–126)
Anion gap: 16 — ABNORMAL HIGH (ref 5–15)
BUN: 68 mg/dL — ABNORMAL HIGH (ref 6–20)
CO2: 20 mmol/L — ABNORMAL LOW (ref 22–32)
Calcium: 8.4 mg/dL — ABNORMAL LOW (ref 8.9–10.3)
Chloride: 96 mmol/L — ABNORMAL LOW (ref 98–111)
Creatinine, Ser: 10.45 mg/dL — ABNORMAL HIGH (ref 0.61–1.24)
GFR, Estimated: 5 mL/min — ABNORMAL LOW (ref >60)
Glucose, Bld: 129 mg/dL — ABNORMAL HIGH (ref 70–99)
Potassium: 4.8 mmol/L (ref 3.5–5.1)
Sodium: 132 mmol/L — ABNORMAL LOW (ref 135–145)
Total Bilirubin: 1.2 mg/dL (ref 0.3–1.2)
Total Protein: 7.6 g/dL (ref 6.5–8.1)

## 2020-07-12 LAB — I-STAT CHEM 8, ED
BUN: 104 mg/dL — ABNORMAL HIGH (ref 6–20)
Calcium, Ion: 0.93 mmol/L — ABNORMAL LOW (ref 1.15–1.40)
Chloride: 104 mmol/L (ref 98–111)
Creatinine, Ser: 10.6 mg/dL — ABNORMAL HIGH (ref 0.61–1.24)
Glucose, Bld: 126 mg/dL — ABNORMAL HIGH (ref 70–99)
HCT: 40 % (ref 39.0–52.0)
Hemoglobin: 13.6 g/dL (ref 13.0–17.0)
Potassium: 7.4 mmol/L (ref 3.5–5.1)
Sodium: 130 mmol/L — ABNORMAL LOW (ref 135–145)
TCO2: 23 mmol/L (ref 22–32)

## 2020-07-12 LAB — CBC WITH DIFFERENTIAL/PLATELET
Abs Immature Granulocytes: 0.02 10*3/uL (ref 0.00–0.07)
Basophils Absolute: 0 10*3/uL (ref 0.0–0.1)
Basophils Relative: 0 %
Eosinophils Absolute: 0 10*3/uL (ref 0.0–0.5)
Eosinophils Relative: 0 %
HCT: 37.9 % — ABNORMAL LOW (ref 39.0–52.0)
Hemoglobin: 12.2 g/dL — ABNORMAL LOW (ref 13.0–17.0)
Immature Granulocytes: 0 %
Lymphocytes Relative: 6 %
Lymphs Abs: 0.6 10*3/uL — ABNORMAL LOW (ref 0.7–4.0)
MCH: 33 pg (ref 26.0–34.0)
MCHC: 32.2 g/dL (ref 30.0–36.0)
MCV: 102.4 fL — ABNORMAL HIGH (ref 80.0–100.0)
Monocytes Absolute: 0.4 10*3/uL (ref 0.1–1.0)
Monocytes Relative: 4 %
Neutro Abs: 8.6 10*3/uL — ABNORMAL HIGH (ref 1.7–7.7)
Neutrophils Relative %: 90 %
Platelets: 233 10*3/uL (ref 150–400)
RBC: 3.7 MIL/uL — ABNORMAL LOW (ref 4.22–5.81)
RDW: 14.4 % (ref 11.5–15.5)
WBC: 9.6 10*3/uL (ref 4.0–10.5)
nRBC: 0 % (ref 0.0–0.2)

## 2020-07-12 LAB — BLOOD GAS, ARTERIAL
Acid-base deficit: 4.1 mmol/L — ABNORMAL HIGH (ref 0.0–2.0)
Bicarbonate: 20.5 mmol/L (ref 20.0–28.0)
FIO2: 100
O2 Saturation: 94.9 %
Patient temperature: 34.7
pCO2 arterial: 41.6 mmHg (ref 32.0–48.0)
pH, Arterial: 7.319 — ABNORMAL LOW (ref 7.350–7.450)
pO2, Arterial: 82.6 mmHg — ABNORMAL LOW (ref 83.0–108.0)

## 2020-07-12 LAB — RESP PANEL BY RT-PCR (FLU A&B, COVID) ARPGX2
Influenza A by PCR: NEGATIVE
Influenza B by PCR: NEGATIVE
SARS Coronavirus 2 by RT PCR: NEGATIVE

## 2020-07-12 LAB — POTASSIUM: Potassium: 5.3 mmol/L — ABNORMAL HIGH (ref 3.5–5.1)

## 2020-07-12 LAB — CBG MONITORING, ED: Glucose-Capillary: 124 mg/dL — ABNORMAL HIGH (ref 70–99)

## 2020-07-12 LAB — LACTIC ACID, PLASMA
Lactic Acid, Venous: 0.9 mmol/L (ref 0.5–1.9)
Lactic Acid, Venous: 0.4 mmol/L — ABNORMAL LOW (ref 0.5–1.9)

## 2020-07-12 LAB — PROCALCITONIN: Procalcitonin: 0.82 ng/mL

## 2020-07-12 MED ORDER — PANTOPRAZOLE SODIUM 40 MG PO TBEC
40.0000 mg | DELAYED_RELEASE_TABLET | Freq: Every day | ORAL | Status: DC
  Administered 2020-07-13 – 2020-07-16 (×4): 40 mg via ORAL
  Filled 2020-07-12 (×4): qty 1

## 2020-07-12 MED ORDER — HEPARIN SODIUM (PORCINE) 5000 UNIT/ML IJ SOLN
5000.0000 [IU] | Freq: Three times a day (TID) | INTRAMUSCULAR | Status: DC
  Administered 2020-07-13 – 2020-07-16 (×11): 5000 [IU] via SUBCUTANEOUS
  Filled 2020-07-12 (×9): qty 1

## 2020-07-12 MED ORDER — NITROGLYCERIN IN D5W 200-5 MCG/ML-% IV SOLN
5.0000 ug/min | INTRAVENOUS | Status: DC
  Administered 2020-07-12: 5 ug/min via INTRAVENOUS
  Administered 2020-07-13 (×2): 160 ug/min via INTRAVENOUS
  Administered 2020-07-13: 150 ug/min via INTRAVENOUS
  Administered 2020-07-14: 175 ug/min via INTRAVENOUS
  Administered 2020-07-14: 160 ug/min via INTRAVENOUS
  Administered 2020-07-14: 175 ug/min via INTRAVENOUS
  Administered 2020-07-15: 100 ug/min via INTRAVENOUS
  Filled 2020-07-12 (×3): qty 250
  Filled 2020-07-12: qty 750
  Filled 2020-07-12 (×2): qty 250

## 2020-07-12 MED ORDER — LIDOCAINE-PRILOCAINE 2.5-2.5 % EX CREA: 1.0000 "application " | TOPICAL_CREAM | CUTANEOUS | Status: DC | PRN

## 2020-07-12 MED ORDER — DM-GUAIFENESIN ER 30-600 MG PO TB12
1.0000 | ORAL_TABLET | Freq: Two times a day (BID) | ORAL | Status: DC
  Administered 2020-07-13 – 2020-07-16 (×7): 1 via ORAL
  Filled 2020-07-12 (×6): qty 1

## 2020-07-12 MED ORDER — DEXTROSE 50 % IV SOLN
1.0000 | Freq: Once | INTRAVENOUS | Status: DC
  Filled 2020-07-12: qty 50

## 2020-07-12 MED ORDER — SODIUM CHLORIDE 0.9 % IV SOLN: 100.0000 mL | INTRAVENOUS | Status: DC | PRN

## 2020-07-12 MED ORDER — CALCIUM ACETATE (PHOS BINDER) 667 MG PO CAPS
667.0000 mg | ORAL_CAPSULE | Freq: Every day | ORAL | Status: DC
  Administered 2020-07-13 – 2020-07-16 (×4): 667 mg via ORAL
  Filled 2020-07-12 (×4): qty 1

## 2020-07-12 MED ORDER — VANCOMYCIN HCL IN DEXTROSE 1-5 GM/200ML-% IV SOLN: 1000.0000 mg | Freq: Once | INTRAVENOUS | Status: DC

## 2020-07-12 MED ORDER — PENTAFLUOROPROP-TETRAFLUOROETH EX AERO: 1.0000 "application " | INHALATION_SPRAY | CUTANEOUS | Status: DC | PRN

## 2020-07-12 MED ORDER — SEVELAMER CARBONATE 800 MG PO TABS
800.0000 mg | ORAL_TABLET | Freq: Three times a day (TID) | ORAL | Status: DC
  Administered 2020-07-13 – 2020-07-16 (×10): 800 mg via ORAL
  Filled 2020-07-12 (×9): qty 1

## 2020-07-12 MED ORDER — CHLORHEXIDINE GLUCONATE CLOTH 2 % EX PADS
6.0000 | MEDICATED_PAD | Freq: Every day | CUTANEOUS | Status: DC
  Administered 2020-07-14 – 2020-07-16 (×3): 6 via TOPICAL

## 2020-07-12 MED ORDER — ENSURE ENLIVE PO LIQD
237.0000 mL | Freq: Two times a day (BID) | ORAL | Status: DC
  Administered 2020-07-16: 237 mL via ORAL

## 2020-07-12 MED ORDER — CALCIUM GLUCONATE-NACL 1-0.675 GM/50ML-% IV SOLN
1.0000 g | Freq: Once | INTRAVENOUS | Status: DC
  Filled 2020-07-12: qty 50

## 2020-07-12 MED ORDER — LIDOCAINE HCL (PF) 1 % IJ SOLN: 5.0000 mL | INTRAMUSCULAR | Status: DC | PRN

## 2020-07-12 MED ORDER — INSULIN ASPART 100 UNIT/ML IV SOLN: 5.0000 [IU] | Freq: Once | INTRAVENOUS | Status: DC

## 2020-07-12 MED ORDER — SODIUM CHLORIDE 0.9 % IV SOLN
2.0000 g | Freq: Once | INTRAVENOUS | Status: AC
  Administered 2020-07-12: 2 g via INTRAVENOUS
  Filled 2020-07-12: qty 2

## 2020-07-12 MED ORDER — MOMETASONE FURO-FORMOTEROL FUM 200-5 MCG/ACT IN AERO
2.0000 | INHALATION_SPRAY | Freq: Two times a day (BID) | RESPIRATORY_TRACT | Status: DC
  Administered 2020-07-13 – 2020-07-16 (×8): 2 via RESPIRATORY_TRACT
  Filled 2020-07-12: qty 8.8

## 2020-07-12 MED ORDER — VANCOMYCIN HCL 1250 MG/250ML IV SOLN
1250.0000 mg | Freq: Once | INTRAVENOUS | Status: AC
  Administered 2020-07-12: 1250 mg via INTRAVENOUS
  Filled 2020-07-12: qty 250

## 2020-07-12 MED ORDER — ALBUTEROL SULFATE (2.5 MG/3ML) 0.083% IN NEBU
10.0000 mg | INHALATION_SOLUTION | Freq: Once | RESPIRATORY_TRACT | Status: AC
  Administered 2020-07-12: 10 mg via RESPIRATORY_TRACT
  Filled 2020-07-12: qty 12

## 2020-07-12 NOTE — Sepsis Progress Note (Signed)
Notified provider of need to order repeat lactic acid. ° °

## 2020-07-12 NOTE — Progress Notes (Signed)
Notified bedside nurse of need to draw blood cultures and lactate, then give antibiotics.

## 2020-07-12 NOTE — Progress Notes (Signed)
Following for code sepsis 

## 2020-07-12 NOTE — Progress Notes (Signed)
45M ESRD on HD at [facility], COPD, HTN, PVD who presented to [facility] ED today due to dyspnea.  He missed dialysis today due to this. Hypothermic and hypertensive on arrival.  CBC ok, CMP ok but istat with K > 7 --> repeat K pending to ensure normal.  He is currently requiring bipap (tolerating comfortably) with CXR suggestive of multifactorial etiology of hypoxia.  COVID and influenza neg.  Has rec'd broad spectrum antibiotics per code sepsis protocol.   I spoke to the dialysis RN and plan will be to do HD tonight x 3 hrs for volume offloading which will hopefully help HTN and hypoxia.     Full consult to follow tomorrow.  Page with acute issues overnight that I can help with.
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
## 2020-07-12 NOTE — ED Notes (Signed)
Receiving nurse to call back for report on patient.

## 2020-07-12 NOTE — ED Triage Notes (Signed)
Pt brought to ED via [facility] for increased SOB. Pt is a dialysis pt, missed treatment today, last treatment was Friday. Pt sats per EMS 77% placed on 4L. Pt sats in triage 79% on 4L placed on NR.

## 2020-07-12 NOTE — Sepsis Progress Note (Signed)
Monitoring for sepsis protocol. °

## 2020-07-12 NOTE — Progress Notes (Signed)
Pharmacy Antibiotic Note  Luke Mueller is a 60 y.o. male in ED on 07/12/2020 with shortness of breath. Code sepsis activated.  Pharmacy has been consulted for Vancomycin and Cefepime dosing for HCAP.    ESRD, noted missed HD today, last HD 07/09/20.  Blood cultures sent.  COVID and flu negative.  Plan:  Cefepime 2gm IV x 1 given.  Vancomycin 1250 mg IV x 1 loading dose ordered.  Will follow up for hemodialysis plans and will order further antibiotics as indicated.    Height: 5\' 10"  (177.8 cm) Weight: 56.7 kg (125 lb) IBW/kg (Calculated) : 73  Temp (24hrs), Avg:94.2 F (34.6 C), Min:94.2 F (34.6 C), Max:94.2 F (34.6 C)  Recent Labs  Lab 07/12/20 1829 07/12/20 1835  WBC 9.6  --   CREATININE 10.45* 10.60*  LATICACIDVEN 0.9  --     Estimated Creatinine Clearance: 6.1 mL/min (A) (by C-G formula based on SCr of 10.6 mg/dL (H)).    Allergies  Allergen Reactions  . Lotrel [Amlodipine Besy-Benazepril Hcl] Swelling    Lips swelling    Antimicrobials this admission:  Cefepime 1/3 >>  Vancomycin 1/3 >>  Dose adjustments this admission:  n/a  Microbiology results:  1/3 blood x 2: sent  1/3 COVID and flu: negative  Thank you for allowing pharmacy to be a part of this patient's care.  Arty Baumgartner, [place] 07/12/2020 7:42 PM

## 2020-07-13 ENCOUNTER — Other Ambulatory Visit: Payer: Self-pay

## 2020-07-13 DIAGNOSIS — J449 Chronic obstructive pulmonary disease, unspecified: Secondary | ICD-10-CM

## 2020-07-13 DIAGNOSIS — J9601 Acute respiratory failure with hypoxia: Secondary | ICD-10-CM

## 2020-07-13 DIAGNOSIS — J189 Pneumonia, unspecified organism: Principal | ICD-10-CM

## 2020-07-13 DIAGNOSIS — N186 End stage renal disease: Secondary | ICD-10-CM | POA: Diagnosis not present

## 2020-07-13 DIAGNOSIS — F17210 Nicotine dependence, cigarettes, uncomplicated: Secondary | ICD-10-CM

## 2020-07-13 LAB — COMPREHENSIVE METABOLIC PANEL
ALT: 16 U/L (ref 0–44)
ALT: 17 U/L (ref 0–44)
AST: 23 U/L (ref 15–41)
AST: 32 U/L (ref 15–41)
Albumin: 3 g/dL — ABNORMAL LOW (ref 3.5–5.0)
Albumin: 2.5 g/dL — ABNORMAL LOW (ref 3.5–5.0)
Alkaline Phosphatase: 153 U/L — ABNORMAL HIGH (ref 38–126)
Alkaline Phosphatase: 128 U/L — ABNORMAL HIGH (ref 38–126)
Anion gap: 16 — ABNORMAL HIGH (ref 5–15)
Anion gap: 15 (ref 5–15)
BUN: 25 mg/dL — ABNORMAL HIGH (ref 6–20)
BUN: 45 mg/dL — ABNORMAL HIGH (ref 6–20)
CO2: 23 mmol/L (ref 22–32)
CO2: 22 mmol/L (ref 22–32)
Calcium: 8.7 mg/dL — ABNORMAL LOW (ref 8.9–10.3)
Calcium: 8.3 mg/dL — ABNORMAL LOW (ref 8.9–10.3)
Chloride: 95 mmol/L — ABNORMAL LOW (ref 98–111)
Chloride: 91 mmol/L — ABNORMAL LOW (ref 98–111)
Creatinine, Ser: 5.3 mg/dL — ABNORMAL HIGH (ref 0.61–1.24)
Creatinine, Ser: 7.63 mg/dL — ABNORMAL HIGH (ref 0.61–1.24)
GFR, Estimated: 12 mL/min — ABNORMAL LOW (ref >60)
GFR, Estimated: 8 mL/min — ABNORMAL LOW (ref >60)
Glucose, Bld: 80 mg/dL (ref 70–99)
Glucose, Bld: 229 mg/dL — ABNORMAL HIGH (ref 70–99)
Potassium: 3.6 mmol/L (ref 3.5–5.1)
Potassium: 4.5 mmol/L (ref 3.5–5.1)
Sodium: 134 mmol/L — ABNORMAL LOW (ref 135–145)
Sodium: 128 mmol/L — ABNORMAL LOW (ref 135–145)
Total Bilirubin: 1.4 mg/dL — ABNORMAL HIGH (ref 0.3–1.2)
Total Bilirubin: 1.1 mg/dL (ref 0.3–1.2)
Total Protein: 7.3 g/dL (ref 6.5–8.1)
Total Protein: 6.5 g/dL (ref 6.5–8.1)

## 2020-07-13 LAB — CBC
HCT: 35.9 % — ABNORMAL LOW (ref 39.0–52.0)
Hemoglobin: 11.8 g/dL — ABNORMAL LOW (ref 13.0–17.0)
MCH: 33.1 pg (ref 26.0–34.0)
MCHC: 32.9 g/dL (ref 30.0–36.0)
MCV: 100.8 fL — ABNORMAL HIGH (ref 80.0–100.0)
Platelets: 262 10*3/uL (ref 150–400)
RBC: 3.56 MIL/uL — ABNORMAL LOW (ref 4.22–5.81)
RDW: 13.8 % (ref 11.5–15.5)
WBC: 8.6 10*3/uL (ref 4.0–10.5)
nRBC: 0 % (ref 0.0–0.2)

## 2020-07-13 LAB — POCT I-STAT, CHEM 8
BUN: 104 mg/dL — ABNORMAL HIGH (ref 6–20)
Calcium, Ion: 0.93 mmol/L — ABNORMAL LOW (ref 1.15–1.40)
Chloride: 104 mmol/L (ref 98–111)
Creatinine, Ser: 10.6 mg/dL — ABNORMAL HIGH (ref 0.61–1.24)
Glucose, Bld: 126 mg/dL — ABNORMAL HIGH (ref 70–99)
HCT: 40 % (ref 39.0–52.0)
Hemoglobin: 13.6 g/dL (ref 13.0–17.0)
Potassium: 7.4 mmol/L (ref 3.5–5.1)
Sodium: 130 mmol/L — ABNORMAL LOW (ref 135–145)
TCO2: 23 mmol/L (ref 22–32)

## 2020-07-13 LAB — PROTIME-INR
INR: 1 (ref 0.8–1.2)
Prothrombin Time: 12.9 seconds (ref 11.4–15.2)

## 2020-07-13 LAB — APTT: aPTT: 41 seconds — ABNORMAL HIGH (ref 24–36)

## 2020-07-13 LAB — MAGNESIUM
Magnesium: 1.9 mg/dL (ref 1.7–2.4)
Magnesium: 2.1 mg/dL (ref 1.7–2.4)

## 2020-07-13 LAB — PHOSPHORUS: Phosphorus: 3.9 mg/dL (ref 2.5–4.6)

## 2020-07-13 LAB — TROPONIN I (HIGH SENSITIVITY): Troponin I (High Sensitivity): 13 ng/L (ref <18)

## 2020-07-13 LAB — LACTIC ACID, PLASMA: Lactic Acid, Venous: 0.7 mmol/L (ref 0.5–1.9)

## 2020-07-13 LAB — MRSA PCR SCREENING: MRSA by PCR: NEGATIVE

## 2020-07-13 MED ORDER — SODIUM CHLORIDE 0.9 % IV SOLN
2.0000 g | Freq: Once | INTRAVENOUS | Status: AC
  Administered 2020-07-13: 2 g via INTRAVENOUS
  Filled 2020-07-13: qty 2

## 2020-07-13 MED ORDER — VANCOMYCIN HCL IN DEXTROSE 500-5 MG/100ML-% IV SOLN
500.0000 mg | INTRAVENOUS | Status: DC
  Filled 2020-07-13: qty 100

## 2020-07-13 MED ORDER — CARVEDILOL 12.5 MG PO TABS: 25.0000 mg | ORAL_TABLET | Freq: Two times a day (BID) | ORAL | Status: DC

## 2020-07-13 MED ORDER — CARVEDILOL 12.5 MG PO TABS
25.0000 mg | ORAL_TABLET | Freq: Two times a day (BID) | ORAL | Status: DC
  Administered 2020-07-13 – 2020-07-15 (×5): 25 mg via ORAL
  Filled 2020-07-13 (×6): qty 2

## 2020-07-13 MED ORDER — HYDRALAZINE HCL 25 MG PO TABS
100.0000 mg | ORAL_TABLET | Freq: Three times a day (TID) | ORAL | Status: DC
  Administered 2020-07-13 – 2020-07-15 (×8): 100 mg via ORAL
  Filled 2020-07-13 (×9): qty 4

## 2020-07-13 MED ORDER — SODIUM CHLORIDE 0.9 % IV SOLN
1.0000 g | INTRAVENOUS | Status: DC
  Administered 2020-07-13 – 2020-07-15 (×3): 1 g via INTRAVENOUS
  Filled 2020-07-13 (×8): qty 1

## 2020-07-13 MED ORDER — VANCOMYCIN HCL 500 MG/100ML IV SOLN
500.0000 mg | Freq: Once | INTRAVENOUS | Status: AC
  Administered 2020-07-13: 500 mg via INTRAVENOUS
  Filled 2020-07-13 (×2): qty 100

## 2020-07-13 MED ORDER — ACETAMINOPHEN 325 MG PO TABS
650.0000 mg | ORAL_TABLET | Freq: Four times a day (QID) | ORAL | Status: DC | PRN
  Administered 2020-07-14: 650 mg via ORAL
  Filled 2020-07-13: qty 2

## 2020-07-13 MED ORDER — LABETALOL HCL 5 MG/ML IV SOLN
10.0000 mg | INTRAVENOUS | Status: DC | PRN
  Administered 2020-07-13 – 2020-07-15 (×9): 10 mg via INTRAVENOUS
  Filled 2020-07-13 (×8): qty 4

## 2020-07-13 MED ORDER — PREDNISONE 20 MG PO TABS
50.0000 mg | ORAL_TABLET | Freq: Every day | ORAL | Status: DC
  Administered 2020-07-13 – 2020-07-16 (×4): 50 mg via ORAL
  Filled 2020-07-13 (×4): qty 1

## 2020-07-13 MED ORDER — AMLODIPINE BESYLATE 5 MG PO TABS
10.0000 mg | ORAL_TABLET | Freq: Every day | ORAL | Status: DC
  Administered 2020-07-13 – 2020-07-15 (×2): 10 mg via ORAL
  Filled 2020-07-13 (×3): qty 2
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
## 2020-07-13 NOTE — Progress Notes (Signed)
Patient Demographics:    Luke Mueller, is a 60 y.o. male, DOB - [birth year], KTG:[id number]  Admit date - 07/12/2020   Admitting Physician Bernadette Hoit, DO  Outpatient Primary MD for the patient is Rosita Fire, MD  LOS - 1   Chief Complaint  Patient presents with  . Shortness of Breath        Subjective:    Luke Mueller today has no fevers, no emesis,  No chest pain,   Cough and shortness of breath persist -No high fevers -  Assessment  & Plan :    Active Problems:   COPD  GOLD 2/ active smoker AB hx    ESRD (end stage renal disease) (HCC)   Hyperkalemia   Cigarette smoker   Acute respiratory failure with hypoxia (HCC)   Pneumonia   Volume overload   Hypoalbuminemia   Prolonged QT interval  Brief Summary:- 60 y.o. male with medical history significant for COPD, HTN, PVD, GERD, ESRD on HD (MWF) admitted on 07/12/20 with dyspnea and acute hypoxic respiratory failure in the setting of Missed HD session---  - A/p 1) acute hypoxic respiratory failure secondary to volume overload in the setting of missed HD session -Need to rule out underlying CAP/pneumonia -Patient had HD session on 07/12/2020 ,plan is for additional HD session on 07/14/2020  -Continue Vanco and cefepime for now (No leukocytosis or high-grade fevers) -Would repeat chest x-ray after HD session on 07/14/2020 -Patient has been weaned off BiPAP, -We will attempt to wean off O2 currently requiring 2 L via nasal cannula -Patient meets SIRS criteria, sepsis has Not been ruled in yet as source of infection not identified yet  2)ESRD--HD as above #1 (usually M/W/F) Discussed with Dr Posey Pronto -Continue Renvela, PhosLo  3)Social/Ethics--patient and his mother are requesting a note stating that he is currently admitted to the hospital- --patient was to appear for court 07/13/20 and needs documentation sent to the courthouse proving he is in the  hospital. She states otherwise a bench warrant may be issued for the patient for being a no-show.   fax number [phone]) [phone], attention: Criminal Department.  4) hypertensive crisis-currently on nitro drip,  ---okay to wean off wean off Nitro drip , amlodipine 10 mg daily, Coreg 25 mg twice daily and hydralazine 100 mg 3 times daily  5) COPD/ongoing tobacco abuse--with exacerbation Dulera as ordered, prednisone as ordered, supplemental oxygen as above #1 -Patient declines nicotine patch  6) chronic anemia of ESRD--- hemoglobin stable close to baseline, Procrit/ESA agent per nephrology team  Disposition/Need for in-Hospital Stay- patient unable to be discharged at this time due to --acute hypoxic respiratory failure  And SIRs--requires IV antibiotics and additional HD sessions to address volume status, hypertensive crisis requiring IV nitro drip  Status is: Inpatient  Remains inpatient appropriate because:Please see above,   Disposition: The patient is from: Home              Anticipated d/c is to: Home              Anticipated d/c date is: 2 days              Patient currently is not medically stable to d/c. Barriers: Not Clinically Stable-   Code Status :  -  Code Status: Full Code   Family Communication:    (patient is alert, awake and coherent)  Discussed with his mother  Consults  :  Nephrology  DVT Prophylaxis  :   - SCDs  heparin injection 5,000 Units Start: 07/12/20 2300 SCDs Start: 07/12/20 2208    Lab Results  Component Value Date   PLT 262 07/13/2020    Inpatient Medications  Scheduled Meds: . amLODipine  10 mg Oral Daily  . calcium acetate  667 mg Oral Q breakfast  . carvedilol  25 mg Oral BID WC  . Chlorhexidine Gluconate Cloth  6 each Topical Q0600  . dextromethorphan-guaiFENesin  1 tablet Oral BID  . insulin aspart  5 Units Intravenous Once   And  . dextrose  1 ampule Intravenous Once  . feeding supplement  237 mL Oral BID BM  . heparin  5,000 Units  Subcutaneous Q8H  . hydrALAZINE  100 mg Oral TID  . mometasone-formoterol  2 puff Inhalation BID  . pantoprazole  40 mg Oral Daily  . predniSONE  50 mg Oral Q breakfast  . sevelamer carbonate  800 mg Oral TID WC   Continuous Infusions: . sodium chloride    . sodium chloride    . calcium gluconate    . ceFEPime (MAXIPIME) IV    . nitroGLYCERIN 160 mcg/min (07/13/20 1403)   PRN Meds:.sodium chloride, sodium chloride, labetalol, lidocaine (PF), lidocaine-prilocaine, pentafluoroprop-tetrafluoroeth    Anti-infectives (From admission, onward)   Start     Dose/Rate Route Frequency Ordered Stop   07/14/20 1200  vancomycin (VANCOCIN) IVPB 500 mg/100 ml premix  Status:  Discontinued        500 mg 100 mL/hr over 60 Minutes Intravenous Every M-W-F (Hemodialysis) 07/13/20 0119 07/13/20 0959   07/13/20 2200  ceFEPIme (MAXIPIME) 1 g in sodium chloride 0.9 % 100 mL IVPB        1 g 200 mL/hr over 30 Minutes Intravenous Every 24 hours 07/13/20 0119     07/13/20 0215  vancomycin (VANCOREADY) IVPB 500 mg/100 mL        500 mg 100 mL/hr over 60 Minutes Intravenous  Once 07/13/20 0115 07/13/20 0415   07/13/20 0215  ceFEPIme (MAXIPIME) 2 g in sodium chloride 0.9 % 100 mL IVPB        2 g 200 mL/hr over 30 Minutes Intravenous  Once 07/13/20 0115 07/13/20 0309   07/12/20 1900  vancomycin (VANCOREADY) IVPB 1250 mg/250 mL        1,250 mg 166.7 mL/hr over 90 Minutes Intravenous  Once 07/12/20 1857 07/12/20 2127   07/12/20 1815  vancomycin (VANCOCIN) IVPB 1000 mg/200 mL premix  Status:  Discontinued        1,000 mg 200 mL/hr over 60 Minutes Intravenous  Once 07/12/20 1802 07/12/20 1857   07/12/20 1815  ceFEPIme (MAXIPIME) 2 g in sodium chloride 0.9 % 100 mL IVPB        2 g 200 mL/hr over 30 Minutes Intravenous  Once 07/12/20 1802 07/12/20 1953        Objective:   Vitals:   07/13/20 1100 07/13/20 1200 07/13/20 1300 07/13/20 1400  BP: (!) 175/68 (!) 148/67 (!) 156/64 (!) 156/75  Pulse: 93 86 80 82   Resp: 18 (!) 21 (!) 22 17  Temp:   99.1 F (37.3 C)   TempSrc:   Oral   SpO2: 92% 94% 94% 98%  Weight:      Height:        Wt  Readings from Last 3 Encounters:  07/12/20 57.1 kg  04/29/20 54 kg  04/01/20 54.1 kg     Intake/Output Summary (Last 24 hours) at 07/13/2020 1508 Last data filed at 07/13/2020 1300 Gross per 24 hour  Intake 1279.96 ml  Output 4000 ml  Net -2720.04 ml   Temp:  [94.2 F (34.6 C)-100.2 F (37.9 C)] 99.1 F (37.3 C) (01/04 1300) Pulse Rate:  [75-103] 82 (01/04 1400) Resp:  [14-31] 17 (01/04 1400) BP: (148-223)/(64-112) 156/75 (01/04 1400) SpO2:  [89 %-100 %] 98 % (01/04 1400) FiO2 (%):  [60 %] 60 % (01/03 2208) Weight:  [56.7 kg-57.1 kg] 57.1 kg (01/03 2206)   Physical Exam  Gen:- Awake Alert, no conversational dyspnea HEENT:- Stem.AT, No sclera icterus Nose- [place] 2L/min Neck-Supple Neck, ,.  Lungs-diminished breath sounds with scattered wheeze CV- S1, S2 normal, regular Abd-  +ve B.Sounds, Abd Soft, No tenderness,    Extremity/Skin:- No  edema, pedal pulses present  Psych-affect is appropriate, oriented x3 Neuro-no new focal deficits, no tremors MSK-right upper extremity AV fistula positive thrill and bruit   Data Review:   Micro Results Recent Results (from the past 240 hour(s))  Resp Panel by RT-PCR (Flu A&B, Covid) Nasopharyngeal Swab     Status: None   Collection Time: 07/12/20  5:20 PM   Specimen: Nasopharyngeal Swab; Nasopharyngeal(NP) swabs in vial transport medium  Result Value Ref Range Status   SARS Coronavirus 2 by RT PCR NEGATIVE NEGATIVE Final    Comment: (NOTE) SARS-CoV-2 target nucleic acids are NOT DETECTED.  The SARS-CoV-2 RNA is generally detectable in upper respiratory specimens during the acute phase of infection. The lowest concentration of SARS-CoV-2 viral copies this assay can detect is 138 copies/mL. A negative result does not preclude SARS-Cov-2 infection and should not be used as the sole basis for treatment  or other patient management decisions. A negative result may occur with  improper specimen collection/handling, submission of specimen other than nasopharyngeal swab, presence of viral mutation(s) within the areas targeted by this assay, and inadequate number of viral copies(<138 copies/mL). A negative result must be combined with clinical observations, patient history, and epidemiological information. The expected result is Negative.  Fact Sheet for Patients:  EntrepreneurPulse.com.au  Fact Sheet for Healthcare Providers:  IncredibleEmployment.be  This test is no t yet approved or cleared by the [place] FDA and  has been authorized for detection and/or diagnosis of SARS-CoV-2 by FDA under an Emergency Use Authorization (EUA). This EUA will remain  in effect (meaning this test can be used) for the duration of the COVID-19 declaration under Section 564(b)(1) of the Act, 21 U.S.C.section 360bbb-3(b)(1), unless the authorization is terminated  or revoked sooner.       Influenza A by PCR NEGATIVE NEGATIVE Final   Influenza B by PCR NEGATIVE NEGATIVE Final    Comment: (NOTE) The Xpert Xpress SARS-CoV-2/FLU/RSV plus assay is intended as an aid in the diagnosis of influenza from Nasopharyngeal swab specimens and should not be used as a sole basis for treatment. Nasal washings and aspirates are unacceptable for Xpert Xpress SARS-CoV-2/FLU/RSV testing.  Fact Sheet for Patients: EntrepreneurPulse.com.au  Fact Sheet for Healthcare Providers: IncredibleEmployment.be  This test is not yet approved or cleared by the [place] FDA and has been authorized for detection and/or diagnosis of SARS-CoV-2 by FDA under an Emergency Use Authorization (EUA). This EUA will remain in effect (meaning this test can be used) for the duration of the COVID-19 declaration under Section 564(b)(1) of the  Act, 21 U.S.C. section  360bbb-3(b)(1), unless the authorization is terminated or revoked.  Performed at [facility], [address], [place], [place] [address]   Culture, blood (routine x 2)     Status: None (Preliminary result)   Collection Time: 07/12/20  6:29 PM   Specimen: Left Antecubital; Blood  Result Value Ref Range Status   Specimen Description LEFT ANTECUBITAL  Final   Special Requests   Final    BOTTLES DRAWN AEROBIC AND ANAEROBIC Blood Culture adequate volume Performed at [facility], [address], [place], [place] [address]    Culture PENDING  Incomplete   Report Status PENDING  Incomplete  Culture, blood (routine x 2)     Status: None (Preliminary result)   Collection Time: 07/12/20  7:51 PM   Specimen: BLOOD LEFT FOREARM  Result Value Ref Range Status   Specimen Description BLOOD LEFT FOREARM  Final   Special Requests   Final    BOTTLES DRAWN AEROBIC AND ANAEROBIC Blood Culture adequate volume Performed at [facility], [address], [place], [place] [address]    Culture PENDING  Incomplete   Report Status PENDING  Incomplete  MRSA PCR Screening     Status: None   Collection Time: 07/12/20  9:26 PM   Specimen: Nasal Mucosa; Nasopharyngeal  Result Value Ref Range Status   MRSA by PCR NEGATIVE NEGATIVE Final    Comment:        The GeneXpert MRSA Assay (FDA approved for NASAL specimens only), is one component of a comprehensive MRSA colonization surveillance program. It is not intended to diagnose MRSA infection nor to guide or monitor treatment for MRSA infections. Performed at [facility], [address], [place],  [address]     Radiology Reports DG Chest Portable 1 View  Result Date: 07/12/2020 CLINICAL DATA:  Increased shortness of breath. Missed dialysis treatment today. EXAM: PORTABLE CHEST 1 VIEW COMPARISON:  12/23/2019 FINDINGS: Cardiac silhouette is grossly normal in size, partly obscured by contiguous lung opacities. No mediastinal or hilar masses. Lungs  demonstrate bilateral irregular interstitial thickening with hazy opacity noted in the mid to lower lungs. Opacity at the lung bases partly obscures hemidiaphragms. Lungs are hyperexpanded. Scarring and changes of emphysema noted at the apices. Suspect small effusions.  No evidence of a pneumothorax. Skeletal structures are demineralized but grossly intact. IMPRESSION: 1. Findings consistent with volume overload with interstitial thickening and hazy mid to lower lung zone airspace opacities as well as probable small pleural effusions. Pneumonia should be considered if there are consistent clinical findings. The current changes are superimposed on chronic changes of COPD. Electronically Signed   By: Lajean Manes M.D.   On: 07/12/2020 17:54     CBC Recent Labs  Lab 07/12/20 1829 07/12/20 1835 07/13/20 0234  WBC 9.6  --  8.6  HGB 12.2* 13.6  13.6 11.8*  HCT 37.9* 40.0  40.0 35.9*  PLT 233  --  262  MCV 102.4*  --  100.8*  MCH 33.0  --  33.1  MCHC 32.2  --  32.9  RDW 14.4  --  13.8  LYMPHSABS 0.6*  --   --   MONOABS 0.4  --   --   EOSABS 0.0  --   --   BASOSABS 0.0  --   --     Chemistries  Recent Labs  Lab 07/12/20 1829 07/12/20 1835 07/12/20 1956 07/13/20 0234  NA 132* 130*  130*  --  134*  K 4.8 7.4*  7.4* 5.3* 3.6  CL 96* 104  104  --  95*  CO2 20*  --   --  23  GLUCOSE 129* 126*  126*  --  80  BUN 68* 104*  104*  --  25*  CREATININE 10.45* 10.60*  10.60*  --  5.30*  CALCIUM 8.4*  --   --  8.7*  MG  --   --   --  1.9  AST 27  --   --  23  ALT 17  --   --  16  ALKPHOS 167*  --   --  153*  BILITOT 1.2  --   --  1.4*   ------------------------------------------------------------------------------------------------------------------ No results for input(s): CHOL, HDL, LDLCALC, TRIG, CHOLHDL, LDLDIRECT in the last 72 hours.  No results found for:  HGBA1C ------------------------------------------------------------------------------------------------------------------ No results for input(s): TSH, T4TOTAL, T3FREE, THYROIDAB in the last 72 hours.  Invalid input(s): FREET3 ------------------------------------------------------------------------------------------------------------------ No results for input(s): VITAMINB12, FOLATE, FERRITIN, TIBC, IRON, RETICCTPCT in the last 72 hours.  Coagulation profile Recent Labs  Lab 07/13/20 0234  INR 1.0    No results for input(s): DDIMER in the last 72 hours.  Cardiac Enzymes No results for input(s): CKMB, TROPONINI, MYOGLOBIN in the last 168 hours.  Invalid input(s): CK ------------------------------------------------------------------------------------------------------------------    Component Value Date/Time   BNP 1,679.0 (H) 09/02/2018 1325     Roxan Hockey M.D on 07/13/2020 at 3:08 PM  Go to [email or web address] - for contact info  [facility] - Office  [phone]

## 2020-07-13 NOTE — Progress Notes (Signed)
12 lead EKG done. Result available in the chart. No STEMI

## 2020-07-13 NOTE — Procedures (Signed)
   EMERGENT HEMODIALYSIS TREATMENT NOTE:  Admitted with pneumonia / volume overload.  On BiPAP and NTG drip.  5.1kg over EDW of 52kg.  HD initiated via right forearm AVF (15g/antegrade). Goal met: 4 liters removed in 3 hours with no interruption in UF.  Weaned from BiPAP to 5L O2 via [place].  Saturating 97%.  All blood was returned.  Hemostasis was achieved in 25 minutes.  Rockwell Alexandria, RN
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
## 2020-07-13 NOTE — Progress Notes (Signed)
Spoke to patient's mother- patient was to appear for court today and needs documentation sent to the courthouse proving he is in the hospital. She states otherwise a bench warrant may be issued for the patient for being a no-show. She gave a fax number [phone]) [phone], attention: Criminal Department. Notified Wells Guiles, Education officer, museum of request.

## 2020-07-13 NOTE — Progress Notes (Signed)
Notice ST elevation changes on tele monitor. Pt is c/o hand pain. No chest pain present. 12 lead EKG ordered. Dr. Josephine Cables notified via [email or web address]

## 2020-07-13 NOTE — Sepsis Progress Note (Signed)
Sepsis monitoring complete 

## 2020-07-14 ENCOUNTER — Inpatient Hospital Stay (HOSPITAL_COMMUNITY): Payer: Medicare HMO

## 2020-07-14 DIAGNOSIS — J9601 Acute respiratory failure with hypoxia: Secondary | ICD-10-CM | POA: Diagnosis not present

## 2020-07-14 LAB — CBC
HCT: 31 % — ABNORMAL LOW (ref 39.0–52.0)
HCT: 33.8 % — ABNORMAL LOW (ref 39.0–52.0)
Hemoglobin: 10.2 g/dL — ABNORMAL LOW (ref 13.0–17.0)
Hemoglobin: 11.3 g/dL — ABNORMAL LOW (ref 13.0–17.0)
MCH: 33.1 pg (ref 26.0–34.0)
MCH: 33.3 pg (ref 26.0–34.0)
MCHC: 32.9 g/dL (ref 30.0–36.0)
MCHC: 33.4 g/dL (ref 30.0–36.0)
MCV: 100.6 fL — ABNORMAL HIGH (ref 80.0–100.0)
MCV: 99.7 fL (ref 80.0–100.0)
Platelets: 215 10*3/uL (ref 150–400)
Platelets: 247 10*3/uL (ref 150–400)
RBC: 3.08 MIL/uL — ABNORMAL LOW (ref 4.22–5.81)
RBC: 3.39 MIL/uL — ABNORMAL LOW (ref 4.22–5.81)
RDW: 13.3 % (ref 11.5–15.5)
RDW: 13.2 % (ref 11.5–15.5)
WBC: 5.1 10*3/uL (ref 4.0–10.5)
WBC: 5.1 10*3/uL (ref 4.0–10.5)
nRBC: 0 % (ref 0.0–0.2)
nRBC: 0 % (ref 0.0–0.2)

## 2020-07-14 LAB — BASIC METABOLIC PANEL
Anion gap: 13 (ref 5–15)
BUN: 51 mg/dL — ABNORMAL HIGH (ref 6–20)
CO2: 22 mmol/L (ref 22–32)
Calcium: 8.2 mg/dL — ABNORMAL LOW (ref 8.9–10.3)
Chloride: 92 mmol/L — ABNORMAL LOW (ref 98–111)
Creatinine, Ser: 8.34 mg/dL — ABNORMAL HIGH (ref 0.61–1.24)
GFR, Estimated: 7 mL/min — ABNORMAL LOW (ref >60)
Glucose, Bld: 151 mg/dL — ABNORMAL HIGH (ref 70–99)
Potassium: 3.9 mmol/L (ref 3.5–5.1)
Sodium: 127 mmol/L — ABNORMAL LOW (ref 135–145)

## 2020-07-14 LAB — TROPONIN I (HIGH SENSITIVITY): Troponin I (High Sensitivity): 13 ng/L (ref <18)

## 2020-07-14 LAB — COMPREHENSIVE METABOLIC PANEL
ALT: 17 U/L (ref 0–44)
AST: 26 U/L (ref 15–41)
Albumin: 2.7 g/dL — ABNORMAL LOW (ref 3.5–5.0)
Alkaline Phosphatase: 124 U/L (ref 38–126)
Anion gap: 15 (ref 5–15)
BUN: 18 mg/dL (ref 6–20)
CO2: 23 mmol/L (ref 22–32)
Calcium: 8.6 mg/dL — ABNORMAL LOW (ref 8.9–10.3)
Chloride: 90 mmol/L — ABNORMAL LOW (ref 98–111)
Creatinine, Ser: 4.18 mg/dL — ABNORMAL HIGH (ref 0.61–1.24)
GFR, Estimated: 16 mL/min — ABNORMAL LOW (ref >60)
Glucose, Bld: 155 mg/dL — ABNORMAL HIGH (ref 70–99)
Potassium: 3.4 mmol/L — ABNORMAL LOW (ref 3.5–5.1)
Sodium: 128 mmol/L — ABNORMAL LOW (ref 135–145)
Total Bilirubin: 0.9 mg/dL (ref 0.3–1.2)
Total Protein: 6.9 g/dL (ref 6.5–8.1)

## 2020-07-14 LAB — PHOSPHORUS: Phosphorus: 3.5 mg/dL (ref 2.5–4.6)

## 2020-07-14 LAB — MAGNESIUM: Magnesium: 1.8 mg/dL (ref 1.7–2.4)

## 2020-07-14 MED ORDER — AMOXICILLIN-POT CLAVULANATE 500-125 MG PO TABS: 1.0000 | ORAL_TABLET | ORAL | 0 refills | Status: DC

## 2020-07-14 MED ORDER — TORSEMIDE 100 MG PO TABS: 100.0000 mg | ORAL_TABLET | Freq: Every day | ORAL | 5 refills | Status: AC

## 2020-07-14 MED ORDER — DIAZEPAM 2 MG PO TABS
2.0000 mg | ORAL_TABLET | Freq: Three times a day (TID) | ORAL | Status: DC
  Administered 2020-07-15 – 2020-07-16 (×5): 2 mg via ORAL
  Filled 2020-07-14 (×5): qty 1

## 2020-07-14 MED ORDER — FOLIC ACID 1 MG PO TABS
1.0000 mg | ORAL_TABLET | Freq: Every day | ORAL | Status: DC
  Administered 2020-07-15 – 2020-07-16 (×2): 1 mg via ORAL
  Filled 2020-07-14 (×2): qty 1

## 2020-07-14 MED ORDER — AMOXICILLIN-POT CLAVULANATE 500-125 MG PO TABS
1.0000 | ORAL_TABLET | Freq: Once | ORAL | Status: AC
  Administered 2020-07-14: 500 mg via ORAL
  Filled 2020-07-14: qty 1

## 2020-07-14 MED ORDER — CARVEDILOL 25 MG PO TABS: 25.0000 mg | ORAL_TABLET | Freq: Two times a day (BID) | ORAL | 5 refills | Status: AC

## 2020-07-14 MED ORDER — CALCIUM ACETATE (PHOS BINDER) 667 MG PO CAPS: 667.0000 mg | ORAL_CAPSULE | Freq: Every day | ORAL | 3 refills | Status: AC

## 2020-07-14 MED ORDER — HYDRALAZINE HCL 100 MG PO TABS: 100.0000 mg | ORAL_TABLET | Freq: Three times a day (TID) | ORAL | 3 refills | Status: AC

## 2020-07-14 MED ORDER — ADULT MULTIVITAMIN W/MINERALS CH
1.0000 | ORAL_TABLET | Freq: Every day | ORAL | Status: DC
  Administered 2020-07-15 – 2020-07-16 (×2): 1 via ORAL
  Filled 2020-07-14 (×2): qty 1

## 2020-07-14 MED ORDER — LORAZEPAM 1 MG PO TABS
1.0000 mg | ORAL_TABLET | ORAL | Status: DC | PRN
  Administered 2020-07-15: 2 mg via ORAL
  Administered 2020-07-15: 1 mg via ORAL
  Filled 2020-07-14 (×2): qty 2

## 2020-07-14 MED ORDER — LORAZEPAM 2 MG/ML IJ SOLN
1.0000 mg | INTRAMUSCULAR | Status: DC | PRN
  Administered 2020-07-14: 4 mg via INTRAVENOUS
  Administered 2020-07-15 (×2): 2 mg via INTRAVENOUS
  Filled 2020-07-14: qty 2
  Filled 2020-07-14 (×2): qty 1

## 2020-07-14 MED ORDER — THIAMINE HCL 100 MG/ML IJ SOLN
100.0000 mg | Freq: Every day | INTRAMUSCULAR | Status: DC
  Administered 2020-07-15: 100 mg via INTRAVENOUS
  Filled 2020-07-14: qty 2

## 2020-07-14 MED ORDER — HEPARIN SODIUM (PORCINE) 1000 UNIT/ML DIALYSIS
40.0000 [IU]/kg | INTRAMUSCULAR | Status: DC | PRN
  Filled 2020-07-14: qty 3

## 2020-07-14 MED ORDER — BUDESONIDE-FORMOTEROL FUMARATE 160-4.5 MCG/ACT IN AERO: 2.0000 | INHALATION_SPRAY | Freq: Two times a day (BID) | RESPIRATORY_TRACT | 12 refills | Status: AC

## 2020-07-14 MED ORDER — THIAMINE HCL 100 MG PO TABS
100.0000 mg | ORAL_TABLET | Freq: Every day | ORAL | Status: DC
  Administered 2020-07-16: 100 mg via ORAL
  Filled 2020-07-14: qty 1

## 2020-07-14 MED ORDER — CLONIDINE HCL 0.1 MG PO TABS
0.1000 mg | ORAL_TABLET | Freq: Two times a day (BID) | ORAL | Status: DC
Start: 2020-07-14 — End: 2020-07-17
  Administered 2020-07-15 (×2): 0.1 mg via ORAL
  Filled 2020-07-14 (×3): qty 1

## 2020-07-14 NOTE — Evaluation (Signed)
Physical Therapy Evaluation Patient Details Name: Luke Mueller MRN: [id number] DOB: December 22, 1960 Today's Date: 07/14/2020   History of Present Illness  Luke Mueller is a 60 y.o. male with medical history significant for COPD, hypertension, PVD, GERD, ESRD on HD (MWF) who presents to the emergency department via EMS due to shortness of breath that started about 1 hour prior to arrival to the ED.  He complained of 3-week onset of cold symptoms  Including nasal congestion, chest congestion, cough with occasional production of green phlegm, weakness.  He states that he has taken Tylenol and over-the-counter cold medications with some relief.  Patient states that patient states that he was notified by dialysis center that there would be no dialysis today and he was scheduled for dialysis on Wednesday (1/5).  Last dialysis was on Friday (07/09/2020), patient states that while at home this afternoon, he started to have increasing shortness of breath, so he activated EMS, on arrival of EMS team, O2 sat was 77% on room air, supplemental oxygen via [place] at 4 LPM was provided and patient was taken to the ED for further evaluation.  He denies shortness of breath on exertion (prior to onset of symptoms), orthopnea, increased leg swelling or abdominal girth, chest pain or abdominal pain.    Clinical Impression  Patient demonstrates good return for transferring to commode in room, ambulating in room/hallways without loss of balance.  Patient encouraged to stay out of bed and ambulate as tolerated for length of stay.  Plan:  Patient discharged from physical therapy to care of nursing for ambulation daily as tolerated for length of stay.    Follow Up Recommendations No PT follow up    Equipment Recommendations  None recommended by PT    Recommendations for Other Services       Precautions / Restrictions Precautions Precautions: None Restrictions Weight Bearing Restrictions: No      Mobility  Bed  Mobility Overal bed mobility: Modified Independent                  Transfers Overall transfer level: Modified independent                  Ambulation/Gait Ambulation/Gait assistance: Modified independent (Device/Increase time) Gait Distance (Feet): 100 Feet Assistive device: None Gait Pattern/deviations: WFL(Within Functional Limits) Gait velocity: decreased   General Gait Details: demonstrates good return for ambulation in room/hallways with slightly labored cadence without loss of balance  Stairs            Wheelchair Mobility    Modified Rankin (Stroke Patients Only)       Balance Overall balance assessment: No apparent balance deficits (not formally assessed)                                           Pertinent Vitals/Pain Pain Assessment: No/denies pain    Home Living Family/patient expects to be discharged to:: Private residence Living Arrangements: Parent Available Help at Discharge: Family;Available PRN/intermittently Type of Home: House Home Access: Stairs to enter   [employer] of Steps: 1 Home Layout: One level Home Equipment: None      Prior Function Level of Independence: Independent               Hand Dominance        Extremity/Trunk Assessment   Upper Extremity Assessment Upper Extremity Assessment: Overall WFL for  tasks assessed    Lower Extremity Assessment Lower Extremity Assessment: Overall WFL for tasks assessed    Cervical / Trunk Assessment Cervical / Trunk Assessment: Normal  Communication   Communication: Expressive difficulties  Cognition Arousal/Alertness: Awake/alert Behavior During Therapy: WFL for tasks assessed/performed Overall Cognitive Status: Within Functional Limits for tasks assessed                                        General Comments      Exercises     Assessment/Plan    PT Assessment Patent does not need any further PT  services  PT Problem List         PT Treatment Interventions      PT Goals (Current goals can be found in the Care Plan section)  Acute Rehab PT Goals Patient Stated Goal: return home PT Goal Formulation: With patient Time For Goal Achievement: 07/14/20 Potential to Achieve Goals: Good    Frequency     Barriers to discharge        Co-evaluation               AM-PAC PT "6 Clicks" Mobility  Outcome Measure Help needed turning from your back to your side while in a flat bed without using bedrails?: None Help needed moving from lying on your back to sitting on the side of a flat bed without using bedrails?: None Help needed moving to and from a bed to a chair (including a wheelchair)?: None Help needed standing up from a chair using your arms (e.g., wheelchair or bedside chair)?: None Help needed to walk in hospital room?: None Help needed climbing 3-5 steps with a railing? : None 6 Click Score: 24    End of Session   Activity Tolerance: Patient tolerated treatment well Patient left: in chair;with call Bastin/phone within reach Nurse Communication: Mobility status PT Visit Diagnosis: Unsteadiness on feet (R26.81);Other abnormalities of gait and mobility (R26.89);Muscle weakness (generalized) (M62.81)    Time: 4259-5638 PT Time Calculation (min) (ACUTE ONLY): 27 min   Charges:   PT Evaluation $PT Eval Moderate Complexity: 1 Mod PT Treatments $Therapeutic Activity: 23-37 mins        11:45 AM, 07/14/20 Lonell Grandchild, MPT Physical Therapist with [facility] [phone] office [phone] mobile phone

## 2020-07-14 NOTE — Progress Notes (Signed)
RN called due to noticing STEMI on the monitor.  EKG was done and showed normal sinus rhythm at rate of 82 beats per minute.  Magnesium level checked was 2.1, troponin I was 13, CMP was without significant change from prior labs.  Patient is scheduled for dialysis in the morning.  Patient was asymptomatic.  Continue to monitor and treat accordingly
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
## 2020-07-14 NOTE — Progress Notes (Signed)
Patient Demographics:    Luke Mueller, is a 60 y.o. male, DOB - 08-16-1960, MOQ:[id number]  Admit date - 07/12/2020   Admitting Physician Bernadette Hoit, DO  Outpatient Primary MD for the patient is Rosita Fire, MD  LOS - 2   Chief Complaint  Patient presents with  . Shortness of Breath        Subjective:    Luke Mueller today has no fevers, no emesis,  No chest pain,   -Memory more confused\with visual hallucinations and speech problems -Concern for stroke versus alcohol withdrawal -  Assessment  & Plan :    Active Problems:   COPD  GOLD 2/ active smoker AB hx    ESRD (end stage renal disease) (HCC)   Hyperkalemia   Cigarette smoker   Acute respiratory failure with hypoxia (HCC)   Pneumonia   Volume overload   Hypoalbuminemia   Prolonged QT interval  Brief Summary:- 60 y.o. male with medical history significant for COPD, HTN, PVD, GERD, ESRD on HD (MWF) admitted on 07/12/20 with dyspnea and acute hypoxic respiratory failure in the setting of Missed HD session---  -Went into DTs on 07/14/2020 - A/p 1) acute hypoxic respiratory failure secondary to volume overload in the setting of missed HD session -Need to rule out underlying CAP/pneumonia -Patient had HD session on 07/12/2020 ,plan is for additional HD session on 07/14/2020  Treated with Vanco and cefepime for now (No leukocytosis or high-grade fevers) - repeat chest x-ray after HD session on 07/14/2020 with improved aeration, still concerns about right-sided pneumonia -Patient has been weaned off BiPAP, -We will attempt to wean off O2 currently requiring 2 L via nasal cannula -Patient meets criteria for sepsis due to right-sided pneumonia- sepsis -POA  2) delirium tremens--- IV Ativan per CIWA protocol -Multivitamin/thiamine/folic acid as ordered  3)Social/Ethics--patient and his mother are requesting a note stating that he is currently  admitted to the hospital- --patient was to appear for court 07/13/20 and needs documentation sent to the courthouse proving he is in the hospital. She states otherwise a bench warrant may be issued for the patient for being a no-show.   fax number [phone]) [phone], attention: Criminal Department.  4)Hypertensive crisis-BP going high again most likely due to delirium tremens, -continue nitro drip,  -c/n  amlodipine 10 mg daily, Coreg 25 mg twice daily and hydralazine 100 mg 3 times daily  5) COPD/ongoing tobacco abuse--with exacerbation Dulera as ordered, prednisone as ordered, supplemental oxygen as above #1 -Patient declines nicotine patch  6) chronic anemia of ESRD--- hemoglobin stable close to baseline, Procrit/ESA agent per nephrology team  7)ESRD--HD as above #1 (usually M/W/F) Discussed with Dr Posey Pronto -Last HD 07/14/2020 -Continue Renvela, PhosLo  Disposition/Need for in-Hospital Stay- patient unable to be discharged at this time due to --acute hypoxic respiratory failure  And sepsis--requires IV antibiotics and additional HD sessions to address volume status, hypertensive crisis requiring IV nitro drip -Requires IV Ativan for DTs  Status is: Inpatient  Remains inpatient appropriate because:Please see above,   Disposition: The patient is from: Home              Anticipated d/c is to: Home              Anticipated d/c  date is: 2 days              Patient currently is not medically stable to d/c. Barriers: Not Clinically Stable-   Code Status :  -  Code Status: Full Code   Family Communication:    (patient is alert, awake and coherent)  Discussed with his mother  Consults  :  Nephrology  DVT Prophylaxis  :   - SCDs  heparin injection 5,000 Units Start: 07/12/20 2300 SCDs Start: 07/12/20 2208    Lab Results  Component Value Date   PLT 215 07/14/2020    Inpatient Medications  Scheduled Meds: . amLODipine  10 mg Oral Daily  . amoxicillin-clavulanate  1 tablet Oral Once   . calcium acetate  667 mg Oral Q breakfast  . carvedilol  25 mg Oral BID WC  . Chlorhexidine Gluconate Cloth  6 each Topical Q0600  . dextromethorphan-guaiFENesin  1 tablet Oral BID  . insulin aspart  5 Units Intravenous Once   And  . dextrose  1 ampule Intravenous Once  . feeding supplement  237 mL Oral BID BM  . heparin  5,000 Units Subcutaneous Q8H  . hydrALAZINE  100 mg Oral TID  . mometasone-formoterol  2 puff Inhalation BID  . pantoprazole  40 mg Oral Daily  . predniSONE  50 mg Oral Q breakfast  . sevelamer carbonate  800 mg Oral TID WC   Continuous Infusions: . sodium chloride    . sodium chloride    . calcium gluconate    . ceFEPime (MAXIPIME) IV Stopped (07/13/20 2243)  . nitroGLYCERIN Stopped (07/14/20 1416)   PRN Meds:.sodium chloride, sodium chloride, acetaminophen, heparin, labetalol, lidocaine (PF), lidocaine-prilocaine, pentafluoroprop-tetrafluoroeth    Anti-infectives (From admission, onward)   Start     Dose/Rate Route Frequency Ordered Stop   07/14/20 1530  amoxicillin-clavulanate (AUGMENTIN) 500-125 MG per tablet 500 mg        1 tablet Oral Once 07/14/20 1440     07/14/20 1200  vancomycin (VANCOCIN) IVPB 500 mg/100 ml premix  Status:  Discontinued        500 mg 100 mL/hr over 60 Minutes Intravenous Every M-W-F (Hemodialysis) 07/13/20 0119 07/13/20 0959   07/14/20 0000  amoxicillin-clavulanate (AUGMENTIN) 500-125 MG tablet        1 tablet Oral Every M-W-F (1800) 07/14/20 1445 07/17/20 2359   07/13/20 2200  ceFEPIme (MAXIPIME) 1 g in sodium chloride 0.9 % 100 mL IVPB        1 g 200 mL/hr over 30 Minutes Intravenous Every 24 hours 07/13/20 0119     07/13/20 0215  vancomycin (VANCOREADY) IVPB 500 mg/100 mL        500 mg 100 mL/hr over 60 Minutes Intravenous  Once 07/13/20 0115 07/13/20 0415   07/13/20 0215  ceFEPIme (MAXIPIME) 2 g in sodium chloride 0.9 % 100 mL IVPB        2 g 200 mL/hr over 30 Minutes Intravenous  Once 07/13/20 0115 07/13/20 0309    07/12/20 1900  vancomycin (VANCOREADY) IVPB 1250 mg/250 mL        1,250 mg 166.7 mL/hr over 90 Minutes Intravenous  Once 07/12/20 1857 07/12/20 2127   07/12/20 1815  vancomycin (VANCOCIN) IVPB 1000 mg/200 mL premix  Status:  Discontinued        1,000 mg 200 mL/hr over 60 Minutes Intravenous  Once 07/12/20 1802 07/12/20 1857   07/12/20 1815  ceFEPIme (MAXIPIME) 2 g in sodium chloride 0.9 % 100 mL IVPB  2 g 200 mL/hr over 30 Minutes Intravenous  Once 07/12/20 1802 07/12/20 1953        Objective:   Vitals:   07/14/20 1415 07/14/20 1422 07/14/20 1430 07/14/20 1445  BP: 125/60 (!) 148/82 (!) 154/71 (!) 150/75  Pulse: 100 (!) 102 (!) 101 (!) 103  Resp: 19 17 18 20   Temp:      TempSrc:      SpO2: 100%     Weight:      Height:        Wt Readings from Last 3 Encounters:  07/14/20 58.4 kg  04/29/20 54 kg  04/01/20 54.1 kg     Intake/Output Summary (Last 24 hours) at 07/14/2020 1501 Last data filed at 07/14/2020 1417 Gross per 24 hour  Intake 1214.88 ml  Output --  Net 1214.88 ml   Temp:  [98.2 F (36.8 C)-98.8 F (37.1 C)] 98.8 F (37.1 C) (01/05 1116) Pulse Rate:  [76-109] 103 (01/05 1445) Resp:  [8-30] 20 (01/05 1445) BP: (125-231)/(58-100) 150/75 (01/05 1445) SpO2:  [88 %-100 %] 100 % (01/05 1415) Weight:  [58.4 kg] 58.4 kg (01/05 1100)  Physical Exam  Gen:- Awake Alert, no conversational dyspnea HEENT:- [place].AT, No sclera icterus Nose- [place] 2L/min Neck-Supple Neck, ,.  Lungs-diminished breath sounds with scattered wheeze CV- S1, S2 normal, regular Abd-  +ve B.Sounds, Abd Soft, No tenderness,    Extremity/Skin:- No  edema, pedal pulses present  Psych-confused, disoriented, agitated, restless, psychotic due to delirium tremens  neuro-no new focal deficits, +ve tremors MSK-right upper extremity AV fistula positive thrill and bruit   Data Review:   Micro Results Recent Results (from the past 240 hour(s))  Resp Panel by RT-PCR (Flu A&B, Covid) Nasopharyngeal  Swab     Status: None   Collection Time: 07/12/20  5:20 PM   Specimen: Nasopharyngeal Swab; Nasopharyngeal(NP) swabs in vial transport medium  Result Value Ref Range Status   SARS Coronavirus 2 by RT PCR NEGATIVE NEGATIVE Final    Comment: (NOTE) SARS-CoV-2 target nucleic acids are NOT DETECTED.  The SARS-CoV-2 RNA is generally detectable in upper respiratory specimens during the acute phase of infection. The lowest concentration of SARS-CoV-2 viral copies this assay can detect is 138 copies/mL. A negative result does not preclude SARS-Cov-2 infection and should not be used as the sole basis for treatment or other patient management decisions. A negative result may occur with  improper specimen collection/handling, submission of specimen other than nasopharyngeal swab, presence of viral mutation(s) within the areas targeted by this assay, and inadequate number of viral copies(<138 copies/mL). A negative result must be combined with clinical observations, patient history, and epidemiological information. The expected result is Negative.  Fact Sheet for Patients:  EntrepreneurPulse.com.au  Fact Sheet for Healthcare Providers:  IncredibleEmployment.be  This test is no t yet approved or cleared by the [place] FDA and  has been authorized for detection and/or diagnosis of SARS-CoV-2 by FDA under an Emergency Use Authorization (EUA). This EUA will remain  in effect (meaning this test can be used) for the duration of the COVID-19 declaration under Section 564(b)(1) of the Act, 21 U.S.C.section 360bbb-3(b)(1), unless the authorization is terminated  or revoked sooner.       Influenza A by PCR NEGATIVE NEGATIVE Final   Influenza B by PCR NEGATIVE NEGATIVE Final    Comment: (NOTE) The Xpert Xpress SARS-CoV-2/FLU/RSV plus assay is intended as an aid in the diagnosis of influenza from Nasopharyngeal swab specimens and should not be used  as a sole  basis for treatment. Nasal washings and aspirates are unacceptable for Xpert Xpress SARS-CoV-2/FLU/RSV testing.  Fact Sheet for Patients: EntrepreneurPulse.com.au  Fact Sheet for Healthcare Providers: IncredibleEmployment.be  This test is not yet approved or cleared by the [place] FDA and has been authorized for detection and/or diagnosis of SARS-CoV-2 by FDA under an Emergency Use Authorization (EUA). This EUA will remain in effect (meaning this test can be used) for the duration of the COVID-19 declaration under Section 564(b)(1) of the Act, 21 U.S.C. section 360bbb-3(b)(1), unless the authorization is terminated or revoked.  Performed at [facility], [address] [place], [place] [address]   Culture, blood (routine x 2)     Status: None (Preliminary result)   Collection Time: 07/12/20  6:29 PM   Specimen: Left Antecubital; Blood  Result Value Ref Range Status   Specimen Description LEFT ANTECUBITAL  Final   Special Requests   Final    BOTTLES DRAWN AEROBIC AND ANAEROBIC Blood Culture adequate volume   Culture   Final    NO GROWTH 2 DAYS Performed at [facility], [address], [place], [place] [address]    Report Status PENDING  Incomplete  Culture, blood (routine x 2)     Status: None (Preliminary result)   Collection Time: 07/12/20  7:51 PM   Specimen: BLOOD LEFT FOREARM  Result Value Ref Range Status   Specimen Description BLOOD LEFT FOREARM  Final   Special Requests   Final    BOTTLES DRAWN AEROBIC AND ANAEROBIC Blood Culture adequate volume   Culture   Final    NO GROWTH 2 DAYS Performed at [facility], [address] [place], [place] [address]    Report Status PENDING  Incomplete  MRSA PCR Screening     Status: None   Collection Time: 07/12/20  9:26 PM   Specimen: Nasal Mucosa; Nasopharyngeal  Result Value Ref Range Status   MRSA by PCR NEGATIVE NEGATIVE Final    Comment:        The GeneXpert MRSA Assay  (FDA approved for NASAL specimens only), is one component of a comprehensive MRSA colonization surveillance program. It is not intended to diagnose MRSA infection nor to guide or monitor treatment for MRSA infections. Performed at [facility], [address] [place], [place] [address]     Radiology Reports DG Chest Portable 1 View  Result Date: 07/12/2020 CLINICAL DATA:  Increased shortness of breath. Missed dialysis treatment today. EXAM: PORTABLE CHEST 1 VIEW COMPARISON:  12/23/2019 FINDINGS: Cardiac silhouette is grossly normal in size, partly obscured by contiguous lung opacities. No mediastinal or hilar masses. Lungs demonstrate bilateral irregular interstitial thickening with hazy opacity noted in the mid to lower lungs. Opacity at the lung bases partly obscures hemidiaphragms. Lungs are hyperexpanded. Scarring and changes of emphysema noted at the apices. Suspect small effusions.  No evidence of a pneumothorax. Skeletal structures are demineralized but grossly intact. IMPRESSION: 1. Findings consistent with volume overload with interstitial thickening and hazy mid to lower lung zone airspace opacities as well as probable small pleural effusions. Pneumonia should be considered if there are consistent clinical findings. The current changes are superimposed on chronic changes of COPD. Electronically Signed   By: Lajean Manes M.D.   On: 07/12/2020 17:54     CBC Recent Labs  Lab 07/12/20 1829 07/12/20 1835 07/13/20 0234 07/14/20 0721  WBC 9.6  --  8.6 5.1  HGB 12.2* 13.6  13.6 11.8* 10.2*  HCT 37.9* 40.0  40.0 35.9* 31.0*  PLT 233  --  262 215  MCV 102.4*  --  100.8* 100.6*  MCH 33.0  --  33.1 33.1  MCHC 32.2  --  32.9 32.9  RDW 14.4  --  13.8 13.3  LYMPHSABS 0.6*  --   --   --   MONOABS 0.4  --   --   --   EOSABS 0.0  --   --   --   BASOSABS 0.0  --   --   --    Chemistries  Recent Labs  Lab 07/12/20 1829 07/12/20 1835 07/12/20 1956 07/13/20 0234 07/13/20 2238  07/14/20 0721  NA 132* 130*  130*  --  134* 128* 127*  K 4.8 7.4*  7.4* 5.3* 3.6 4.5 3.9  CL 96* 104  104  --  95* 91* 92*  CO2 20*  --   --  23 22 22   GLUCOSE 129* 126*  126*  --  80 229* 151*  BUN 68* 104*  104*  --  25* 45* 51*  CREATININE 10.45* 10.60*  10.60*  --  5.30* 7.63* 8.34*  CALCIUM 8.4*  --   --  8.7* 8.3* 8.2*  MG  --   --   --  1.9 2.1  --   AST 27  --   --  23 32  --   ALT 17  --   --  16 17  --   ALKPHOS 167*  --   --  153* 128*  --   BILITOT 1.2  --   --  1.4* 1.1  --    ------------------------------------------------------------------------------------------------------------------ No results for input(s): CHOL, HDL, LDLCALC, TRIG, CHOLHDL, LDLDIRECT in the last 72 hours.  No results found for: HGBA1C ------------------------------------------------------------------------------------------------------------------ No results for input(s): TSH, T4TOTAL, T3FREE, THYROIDAB in the last 72 hours.  Invalid input(s): FREET3 ------------------------------------------------------------------------------------------------------------------ No results for input(s): VITAMINB12, FOLATE, FERRITIN, TIBC, IRON, RETICCTPCT in the last 72 hours.  Coagulation profile Recent Labs  Lab 07/13/20 0234  INR 1.0    No results for input(s): DDIMER in the last 72 hours.  Cardiac Enzymes No results for input(s): CKMB, TROPONINI, MYOGLOBIN in the last 168 hours.  Invalid input(s): CK ------------------------------------------------------------------------------------------------------------------    Component Value Date/Time   BNP 1,679.0 (H) 09/02/2018 1325    Roxan Hockey M.D on 07/14/2020 at 3:01 PM  Go to [email or web address] - for contact info  [facility] - Office  [phone]

## 2020-07-14 NOTE — Progress Notes (Signed)
Patient is restless, hypertensive, tachycardic, with tremors, confirmed with mother, patient drinks 2 beers daily, she reports 1 beer per day, patient tells me 2, he is confused, he does appear to be in alcohol withdrawal, so he is started on stepdown CIWA protocol, as well I have started him on clonidine to help with withdrawal, and blood pressure. Phillips Climes MD

## 2020-07-14 NOTE — Progress Notes (Signed)
Patient is having changes in LOC according to NT. Upon my entering the room patient is able to clearly state his name, birthdate, and where he is. He drinks water from a straw without spilling. There are no changes in grip strength or ability to follow commands. No facial droop noted, No limb drift noted. Pt is due for dialysis today.

## 2020-07-14 NOTE — Pre-Procedure Instructions (Signed)
Patient is currently in the hospital. Dr Isaias Sakai per interoffice mail to see if he wants to proceed with procedure on 07/20/2020.

## 2020-07-14 NOTE — Progress Notes (Signed)
[facility] Progress Note    Assessment/ Plan:   1.  Acute hypoxic respiratory failure: Appears to be multifactorial with COPD exacerbation and volume overload.  Had some clinical improvement with HD on admission. On HCAP coverage with vancomycin and cefepime along with bronchodilators and Mucinex. 2.  End-stage renal disease: MWF at [facility]. S/p emergent HD on admit. Adherence to regimen is an issue here which we encouraged him to discuss his HD plan with his unit and outpatient nephrologist. HD planned for today.  3.  Hypertension: Appears to have spotty adherence with outpatient antihypertensive therapy and appears to have been exacerbated by volume overload.  Remains uncontrolled, planning for aggressive UF today (up to 5L if tolerates). Currently on tridil gtt. 4.  Secondary hyperparathyroidism: on phoslo and renvela. Phos and PTH ordered for AM 5.  Anemia of chronic kidney disease: Without overt loss, hemoglobin and hematocrit currently at goal and without indications for ESA. 6. Access: right RC AVF +b/t  Subjective:   Walked into his room and patient keeps repeating 'I can't talk' but then is saying 'I don't know why I can't talk.' Keeps repeating sentence. Discussed with RN, [place] pending per primary. When asked him if his SOB is better as compared to before, he did nod his head. Concern for STEMI overnight on tele but turned out to not be the case upon further investigation.   Objective:   BP (!) 190/84   Pulse 80   Temp 98.2 F (36.8 C) (Oral)   Resp (!) 21   Ht 5\' 10"  (1.778 m)   Wt 58.4 kg   SpO2 99%   BMI 18.47 kg/m   Intake/Output Summary (Last 24 hours) at 07/14/2020 0754 Last data filed at 07/13/2020 2104 Gross per 24 hour  Intake 935.22 ml  Output --  Net 935.22 ml   Weight change: 1.7 kg  Physical Exam: Gen:nad, sitting in bed CVS:s1s2, rrr Resp:decrease breath sounds bibasilar, no w/r/r/c heard today, unlabored, bl chest  expansion YDX:AJOI, nt/nd Ext:no edema Neuro: moves all ext spontaneously, speech clear and coherent, keeps repeating 'I can't talk', following commands Access: rue rc avf +b/t  Imaging: DG Chest Portable 1 View  Result Date: 07/12/2020 CLINICAL DATA:  Increased shortness of breath. Missed dialysis treatment today. EXAM: PORTABLE CHEST 1 VIEW COMPARISON:  12/23/2019 FINDINGS: Cardiac silhouette is grossly normal in size, partly obscured by contiguous lung opacities. No mediastinal or hilar masses. Lungs demonstrate bilateral irregular interstitial thickening with hazy opacity noted in the mid to lower lungs. Opacity at the lung bases partly obscures hemidiaphragms. Lungs are hyperexpanded. Scarring and changes of emphysema noted at the apices. Suspect small effusions.  No evidence of a pneumothorax. Skeletal structures are demineralized but grossly intact. IMPRESSION: 1. Findings consistent with volume overload with interstitial thickening and hazy mid to lower lung zone airspace opacities as well as probable small pleural effusions. Pneumonia should be considered if there are consistent clinical findings. The current changes are superimposed on chronic changes of COPD. Electronically Signed   By: Lajean Manes M.D.   On: 07/12/2020 17:54    Labs: BMET Recent Labs  Lab 07/12/20 1829 07/12/20 1835 07/12/20 1956 07/13/20 0234 07/13/20 2238  NA 132* 130*  130*  --  134* 128*  K 4.8 7.4*  7.4* 5.3* 3.6 4.5  CL 96* 104  104  --  95* 91*  CO2 20*  --   --  23 22  GLUCOSE 129* 126*  126*  --  80 229*  BUN 68* 104*  104*  --  25* 45*  CREATININE 10.45* 10.60*  10.60*  --  5.30* 7.63*  CALCIUM 8.4*  --   --  8.7* 8.3*  PHOS  --   --   --  3.9  --    CBC Recent Labs  Lab 07/12/20 1829 07/12/20 1835 07/13/20 0234  WBC 9.6  --  8.6  NEUTROABS 8.6*  --   --   HGB 12.2* 13.6  13.6 11.8*  HCT 37.9* 40.0  40.0 35.9*  MCV 102.4*  --  100.8*  PLT 233  --  262    Medications:    .  amLODipine  10 mg Oral Daily  . calcium acetate  667 mg Oral Q breakfast  . carvedilol  25 mg Oral BID WC  . Chlorhexidine Gluconate Cloth  6 each Topical Q0600  . dextromethorphan-guaiFENesin  1 tablet Oral BID  . insulin aspart  5 Units Intravenous Once   And  . dextrose  1 ampule Intravenous Once  . feeding supplement  237 mL Oral BID BM  . heparin  5,000 Units Subcutaneous Q8H  . hydrALAZINE  100 mg Oral TID  . mometasone-formoterol  2 puff Inhalation BID  . pantoprazole  40 mg Oral Daily  . predniSONE  50 mg Oral Q breakfast  . sevelamer carbonate  800 mg Oral TID WC      Gean Quint, MD [facility] 07/14/2020, 7:54 AM

## 2020-07-14 NOTE — Procedures (Addendum)
   HEMODIALYSIS TREATMENT NOTE:  Ambulated around unit with PT prior to HD.  Steady gait.  Transferred from recliner to bed without difficulty.  States he is having trouble speaking.  Occasional nonsensical and repetitive speech noted:  "I've been here forever and where the people?"  "I wanna.... I wanna.... damn, damn, damn."  "Get me, get me, get me Leretha Dykes here." Yet oriented to self, place, and situation. For head CT after HD.  SBP 220-230 and still on NTG drip pre-dialysis.  Room air sats 99%.  4 hour heparin-free HD completed via left forearm AVF.  Successfully weaned off nitro by the third hour of treatment.  BP normalized as low as 125/60 before it began rising again.  Net UF 4.3 liters.  All blood was returned.  Post HD BP 206/97.  Oral antihypertensives were administered by primary RN.  Rockwell Alexandria, RN

## 2020-07-15 ENCOUNTER — Encounter (HOSPITAL_COMMUNITY)
Admission: RE | Admit: 2020-07-15 | Discharge: 2020-07-15 | Disposition: A | Discharge status: Home / Self Care | Payer: Medicaid Other | Source: Ambulatory Visit | Attending: Internal Medicine | Admitting: Internal Medicine

## 2020-07-15 DIAGNOSIS — E8809 Other disorders of plasma-protein metabolism, not elsewhere classified: Secondary | ICD-10-CM

## 2020-07-15 DIAGNOSIS — F10231 Alcohol dependence with withdrawal delirium: Secondary | ICD-10-CM | POA: Diagnosis present

## 2020-07-15 DIAGNOSIS — F10931 Alcohol use, unspecified with withdrawal delirium: Secondary | ICD-10-CM | POA: Diagnosis present

## 2020-07-15 LAB — RENAL FUNCTION PANEL
Albumin: 2.6 g/dL — ABNORMAL LOW (ref 3.5–5.0)
Anion gap: 12 (ref 5–15)
BUN: 22 mg/dL — ABNORMAL HIGH (ref 6–20)
CO2: 27 mmol/L (ref 22–32)
Calcium: 8.8 mg/dL — ABNORMAL LOW (ref 8.9–10.3)
Chloride: 92 mmol/L — ABNORMAL LOW (ref 98–111)
Creatinine, Ser: 4.99 mg/dL — ABNORMAL HIGH (ref 0.61–1.24)
GFR, Estimated: 13 mL/min — ABNORMAL LOW (ref >60)
Glucose, Bld: 121 mg/dL — ABNORMAL HIGH (ref 70–99)
Phosphorus: 3.9 mg/dL (ref 2.5–4.6)
Potassium: 3.6 mmol/L (ref 3.5–5.1)
Sodium: 131 mmol/L — ABNORMAL LOW (ref 135–145)

## 2020-07-15 LAB — CBC
HCT: 35.9 % — ABNORMAL LOW (ref 39.0–52.0)
Hemoglobin: 12.1 g/dL — ABNORMAL LOW (ref 13.0–17.0)
MCH: 33.9 pg (ref 26.0–34.0)
MCHC: 33.7 g/dL (ref 30.0–36.0)
MCV: 100.6 fL — ABNORMAL HIGH (ref 80.0–100.0)
Platelets: 265 10*3/uL (ref 150–400)
RBC: 3.57 MIL/uL — ABNORMAL LOW (ref 4.22–5.81)
RDW: 13.3 % (ref 11.5–15.5)
WBC: 5.2 10*3/uL (ref 4.0–10.5)
nRBC: 0 % (ref 0.0–0.2)

## 2020-07-15 LAB — GLUCOSE, CAPILLARY: Glucose-Capillary: 265 mg/dL — ABNORMAL HIGH (ref 70–99)

## 2020-07-15 MED ORDER — LORAZEPAM 2 MG/ML IJ SOLN
1.0000 mg | Freq: Once | INTRAMUSCULAR | Status: AC
  Administered 2020-07-15: 1 mg via INTRAVENOUS
  Filled 2020-07-15: qty 1

## 2020-07-15 NOTE — Progress Notes (Addendum)
Patient Demographics:    Luke Mueller, is a 60 y.o. male, DOB - 03-13-1961, DJM:[id number]  Admit date - 07/12/2020   Admitting Physician Bernadette Hoit, DO  Outpatient Primary MD for the patient is Rosita Fire, MD  LOS - 3   Chief Complaint  Patient presents with  . Shortness of Breath        Subjective:    [name] today has no fevers, no emesis,  No chest pain,   -DT symptoms persist despite IV Ativan -Hallucinating, will add IV Precedex drip -  Assessment  & Plan :    Active Problems:   COPD  GOLD 2/ active smoker AB hx    ESRD (end stage renal disease) (HCC)   Hyperkalemia   Cigarette smoker   Acute respiratory failure with hypoxia (HCC)   Pneumonia   Volume overload   Hypoalbuminemia   Prolonged QT interval  Brief Summary:- 60 y.o. male with medical history significant for COPD, HTN, PVD, GERD, ESRD on HD (MWF) admitted on 07/12/20 with dyspnea and acute hypoxic respiratory failure in the setting of Missed HD session---  -Went into DTs on 07/14/2020 - A/p 1) acute hypoxic respiratory failure secondary to volume overload in the setting of missed HD session -Need to rule out underlying CAP/pneumonia -Patient had HD session on 07/12/2020 ,plan is for additional HD session on 07/14/2020  Treated with Vanco and cefepime for now (No leukocytosis or high-grade fevers) - repeat chest x-ray after HD session on 07/14/2020 with improved aeration, still concerns about right-sided pneumonia -Patient has been weaned off BiPAP, -We will attempt to wean off O2 currently requiring 2 L via nasal cannula -Patient meets criteria for sepsis due to right-sided pneumonia- sepsis -POA  2)Delirium Tremens--- DT symptoms including hallucinations and agitation worsened despite IV Ativan  -Place patient on IV Precedex Okay to use as needed IV Ativan per CIWA protocol -Continue multivitamin/thiamine/folic acid  as ordered  3)Social/Ethics--patient and his mother are requesting a note stating that he is currently admitted to the hospital- --patient was to appear for court 07/13/20 and needs documentation sent to the courthouse proving he is in the hospital. She states otherwise a bench warrant may be issued for the patient for being a no-show.   fax number [phone]) [phone], attention: Criminal Department.  4)Hypertensive crisis-BP improving on nitro drip -continue nitro drip,  -Anticipate further improvement in BP with improvement in delirium tremens -c/n  amlodipine 10 mg daily, Coreg 25 mg twice daily and hydralazine 100 mg 3 times daily  5) COPD/ongoing tobacco abuse---- c/n  [name] as ordered, prednisone as ordered, supplemental oxygen as above #1 -Patient declines nicotine patch  6) chronic anemia of ESRD--- hemoglobin stable close to baseline, Procrit/ESA agent per nephrology team  7)ESRD--HD as above #1 (usually M/W/F) Discussed with Dr Posey Pronto -Last HD 07/14/2020 -Continue Renvela, PhosLo   CRITICAL CARE Performed by: Roxan Hockey   Total critical care time: 43 minutes  Critical care time was exclusive of separately billable procedures and treating other patients.  Critical care was necessary to treat or prevent imminent or life-threatening deterioration.  -Persistent delirium tremens symptoms with elevated BP elevated heart rate confusion agitation and restlessness requiring continuous IV Precedex drip along with as needed IV Ativan- High  risk of decompensation and death if delirium tremens not managed appropriately  Critical care was time spent personally by me on the following activities: development of treatment plan with patient and/or surrogate as well as nursing, discussions with consultants, evaluation of patient's response to treatment, examination of patient, obtaining history from patient or surrogate, ordering and performing treatments and interventions, ordering and review  of laboratory studies, ordering and review of radiographic studies, pulse oximetry and re-evaluation of patient's condition.   Disposition/Need for in-Hospital Stay- patient unable to be discharged at this time due to --acute hypoxic respiratory failure  And sepsis--requires IV antibiotics and additional HD sessions to address volume status, hypertensive crisis requiring IV nitro drip -Requires IV Ativan and IV Precedex drip for DTs  Status is: Inpatient  Remains inpatient appropriate because:Please see above,   Disposition: The patient is from: Home              Anticipated d/c is to: Home              Anticipated d/c date is: 2 days              Patient currently is not medically stable to d/c. Barriers: Not Clinically Stable-   Code Status :  -  Code Status: Full Code   Family Communication:    (patient is alert, awake and coherent)  Discussed with his mother  Consults  :  Nephrology  DVT Prophylaxis  :   - SCDs  heparin injection 5,000 Units Start: 07/12/20 2300 SCDs Start: 07/12/20 2208  Lab Results  Component Value Date   PLT 265 07/15/2020   Inpatient Medications  Scheduled Meds: . amLODipine  10 mg Oral Daily  . calcium acetate  667 mg Oral Q breakfast  . carvedilol  25 mg Oral BID WC  . Chlorhexidine Gluconate Cloth  6 each Topical Q0600  . cloNIDine  0.1 mg Oral BID  . dextromethorphan-guaiFENesin  1 tablet Oral BID  . insulin aspart  5 Units Intravenous Once   And  . dextrose  1 ampule Intravenous Once  . diazepam  2 mg Oral TID  . feeding supplement  237 mL Oral BID BM  . folic acid  1 mg Oral Daily  . heparin  5,000 Units Subcutaneous Q8H  . hydrALAZINE  100 mg Oral TID  . mometasone-formoterol  2 puff Inhalation BID  . multivitamin with minerals  1 tablet Oral Daily  . pantoprazole  40 mg Oral Daily  . predniSONE  50 mg Oral Q breakfast  . sevelamer carbonate  800 mg Oral TID WC  . thiamine  100 mg Oral Daily   Or  . thiamine  100 mg Intravenous  Daily   Continuous Infusions: . sodium chloride    . sodium chloride    . calcium gluconate    . ceFEPime (MAXIPIME) IV Stopped (07/14/20 2132)  . nitroGLYCERIN Stopped (07/15/20 0946)   PRN Meds:.sodium chloride, sodium chloride, acetaminophen, heparin, labetalol, lidocaine (PF), lidocaine-prilocaine, LORazepam **OR** LORazepam, pentafluoroprop-tetrafluoroeth    Anti-infectives (From admission, onward)   Start     Dose/Rate Route Frequency Ordered Stop   07/14/20 1530  amoxicillin-clavulanate (AUGMENTIN) 500-125 MG per tablet 500 mg        1 tablet Oral Once 07/14/20 1440 07/14/20 1536   07/14/20 1200  vancomycin (VANCOCIN) IVPB 500 mg/100 ml premix  Status:  Discontinued        500 mg 100 mL/hr over 60 Minutes Intravenous Every M-W-F (Hemodialysis) 07/13/20 0119  07/13/20 0959   07/14/20 0000  amoxicillin-clavulanate (AUGMENTIN) 500-125 MG tablet        1 tablet Oral Every M-W-F (1800) 07/14/20 1445 07/17/20 2359   07/13/20 2200  ceFEPIme (MAXIPIME) 1 g in sodium chloride 0.9 % 100 mL IVPB        1 g 200 mL/hr over 30 Minutes Intravenous Every 24 hours 07/13/20 0119     07/13/20 0215  vancomycin (VANCOREADY) IVPB 500 mg/100 mL        500 mg 100 mL/hr over 60 Minutes Intravenous  Once 07/13/20 0115 07/13/20 0415   07/13/20 0215  ceFEPIme (MAXIPIME) 2 g in sodium chloride 0.9 % 100 mL IVPB        2 g 200 mL/hr over 30 Minutes Intravenous  Once 07/13/20 0115 07/13/20 0309   07/12/20 1900  vancomycin (VANCOREADY) IVPB 1250 mg/250 mL        1,250 mg 166.7 mL/hr over 90 Minutes Intravenous  Once 07/12/20 1857 07/12/20 2127   07/12/20 1815  vancomycin (VANCOCIN) IVPB 1000 mg/200 mL premix  Status:  Discontinued        1,000 mg 200 mL/hr over 60 Minutes Intravenous  Once 07/12/20 1802 07/12/20 1857   07/12/20 1815  ceFEPIme (MAXIPIME) 2 g in sodium chloride 0.9 % 100 mL IVPB        2 g 200 mL/hr over 30 Minutes Intravenous  Once 07/12/20 1802 07/12/20 1953        Objective:    Vitals:   07/15/20 1215 07/15/20 1230 07/15/20 1245 07/15/20 1300  BP: 135/67 (!) 148/67 (!) 145/71 (!) 144/75  Pulse: 78 73 75 78  Resp: 20 18 17 19   Temp:      TempSrc:      SpO2: 96% 100% 100% 100%  Weight:      Height:        Wt Readings from Last 3 Encounters:  07/15/20 58.2 kg  04/29/20 54 kg  04/01/20 54.1 kg     Intake/Output Summary (Last 24 hours) at 07/15/2020 1443 Last data filed at 07/15/2020 0947 Gross per 24 hour  Intake 269.73 ml  Output 4385 ml  Net -4115.27 ml   Temp:  [97.7 F (36.5 C)-98.7 F (37.1 C)] 97.7 F (36.5 C) (01/06 1127) Pulse Rate:  [70-104] 78 (01/06 1300) Resp:  [10-33] 19 (01/06 1300) BP: (99-229)/(51-103) 144/75 (01/06 1300) SpO2:  [93 %-100 %] 100 % (01/06 1300) Weight:  [58.2 kg] 58.2 kg (01/06 0500)  Physical Exam  Gen:- Awake Alert, no conversational dyspnea HEENT:- [place].AT, No sclera icterus Nose- [place] 2L/min Neck-Supple Neck, ,.  Lungs-diminished breath sounds with scattered wheeze CV- S1, S2 normal, regular Abd-  +ve B.Sounds, Abd Soft, No tenderness,    Extremity/Skin:- No  edema, pedal pulses present  Psych-confused, disoriented, agitated, restless, psychotic due to delirium tremens  neuro-no new focal deficits, +ve tremors MSK-right upper extremity AV fistula positive thrill and bruit   Data Review:   Micro Results Recent Results (from the past 240 hour(s))  Resp Panel by RT-PCR (Flu A&B, Covid) Nasopharyngeal Swab     Status: None   Collection Time: 07/12/20  5:20 PM   Specimen: Nasopharyngeal Swab; Nasopharyngeal(NP) swabs in vial transport medium  Result Value Ref Range Status   SARS Coronavirus 2 by RT PCR NEGATIVE NEGATIVE Final    Comment: (NOTE) SARS-CoV-2 target nucleic acids are NOT DETECTED.  The SARS-CoV-2 RNA is generally detectable in upper respiratory specimens during the acute phase of infection. The lowest  concentration of SARS-CoV-2 viral copies this assay can detect is 138 copies/mL. A  negative result does not preclude SARS-Cov-2 infection and should not be used as the sole basis for treatment or other patient management decisions. A negative result may occur with  improper specimen collection/handling, submission of specimen other than nasopharyngeal swab, presence of viral mutation(s) within the areas targeted by this assay, and inadequate number of viral copies(<138 copies/mL). A negative result must be combined with clinical observations, patient history, and epidemiological information. The expected result is Negative.  Fact Sheet for Patients:  EntrepreneurPulse.com.au  Fact Sheet for Healthcare Providers:  IncredibleEmployment.be  This test is no t yet approved or cleared by the [place] FDA and  has been authorized for detection and/or diagnosis of SARS-CoV-2 by FDA under an Emergency Use Authorization (EUA). This EUA will remain  in effect (meaning this test can be used) for the duration of the COVID-19 declaration under Section 564(b)(1) of the Act, 21 U.S.C.section 360bbb-3(b)(1), unless the authorization is terminated  or revoked sooner.       Influenza A by PCR NEGATIVE NEGATIVE Final   Influenza B by PCR NEGATIVE NEGATIVE Final    Comment: (NOTE) The Xpert Xpress SARS-CoV-2/FLU/RSV plus assay is intended as an aid in the diagnosis of influenza from Nasopharyngeal swab specimens and should not be used as a sole basis for treatment. Nasal washings and aspirates are unacceptable for Xpert Xpress SARS-CoV-2/FLU/RSV testing.  Fact Sheet for Patients: EntrepreneurPulse.com.au  Fact Sheet for Healthcare Providers: IncredibleEmployment.be  This test is not yet approved or cleared by the [place] FDA and has been authorized for detection and/or diagnosis of SARS-CoV-2 by FDA under an Emergency Use Authorization (EUA). This EUA will remain in effect (meaning this test can  be used) for the duration of the COVID-19 declaration under Section 564(b)(1) of the Act, 21 U.S.C. section 360bbb-3(b)(1), unless the authorization is terminated or revoked.  Performed at [facility], [address] [place], [place] [address]   Culture, blood (routine x 2)     Status: None (Preliminary result)   Collection Time: 07/12/20  6:29 PM   Specimen: Left Antecubital; Blood  Result Value Ref Range Status   Specimen Description LEFT ANTECUBITAL  Final   Special Requests   Final    BOTTLES DRAWN AEROBIC AND ANAEROBIC Blood Culture adequate volume   Culture   Final    NO GROWTH 3 DAYS Performed at [facility], [address] [place], [place] [address]    Report Status PENDING  Incomplete  Culture, blood (routine x 2)     Status: None (Preliminary result)   Collection Time: 07/12/20  7:51 PM   Specimen: BLOOD LEFT FOREARM  Result Value Ref Range Status   Specimen Description BLOOD LEFT FOREARM  Final   Special Requests   Final    BOTTLES DRAWN AEROBIC AND ANAEROBIC Blood Culture adequate volume   Culture   Final    NO GROWTH 3 DAYS Performed at [facility], [address] [place], [place] [address]    Report Status PENDING  Incomplete  MRSA PCR Screening     Status: None   Collection Time: 07/12/20  9:26 PM   Specimen: Nasal Mucosa; Nasopharyngeal  Result Value Ref Range Status   MRSA by PCR NEGATIVE NEGATIVE Final    Comment:        The GeneXpert MRSA Assay (FDA approved for NASAL specimens only), is one component of a comprehensive MRSA colonization surveillance program. It is  not intended to diagnose MRSA infection nor to guide or monitor treatment for MRSA infections. Performed at [facility], [address] [place], [place] [address]     Radiology Reports DG Chest 2 View  Result Date: 07/14/2020 CLINICAL DATA:  Shortness of breath EXAM: CHEST - 2 VIEW COMPARISON:  07/12/2020 FINDINGS: Cardiomediastinal silhouette within normal limits. There is been  interval improvement in aeration of the lungs. Some patchy opacity remains at the right lung base. IMPRESSION: Interval improvement in aeration of the lungs. Remaining opacity at the right lung base likely combination of airspace disease and small pleural effusion. Electronically Signed   By: Miachel Roux M.D.   On: 07/14/2020 16:20   CT HEAD WO CONTRAST  Result Date: 07/14/2020 CLINICAL DATA:  Altered mental status, difficulty speaking EXAM: CT HEAD WITHOUT CONTRAST TECHNIQUE: Contiguous axial images were obtained from the base of the skull through the vertex without intravenous contrast. COMPARISON:  None. FINDINGS: Brain: No evidence of acute infarction, hemorrhage, hydrocephalus, extra-axial collection or mass lesion/mass effect. Focal hypoattenuation of the periventricular white matter adjacent to the bilateral frontal horns with extension into the subcortical and deep white matter greater on the left than the right. Vascular: No hyperdense vessel or unexpected calcification. Skull: Normal. Negative for fracture or focal lesion. Sinuses/Orbits: Complete opacification of the right maxillary sinus. Intermediate to high density secretions. Slight hyperostosis of the posterior wall. Partial opacification of the right anterior ethmoid air cells as well. Other: None. IMPRESSION: 1. Focal white matter hypoattenuation in the inferior aspect of the left greater than right frontal lobes. This pattern is nonspecific but can be seen in the setting of prior traumatic brain injury. Alternately, chronic microvascular ischemic white matter disease, or an underlying demyelinating process are additional considerations. 2. No evidence of acute intracranial hemorrhage or stroke. 3. Chronic appearing right maxillary sinusitis with inspissated secretions. Electronically Signed   By: Jacqulynn Cadet M.D.   On: 07/14/2020 16:12   MR BRAIN WO CONTRAST  Result Date: 07/14/2020 CLINICAL DATA:  Mental status change, unknown cause.  EXAM: MRI HEAD WITHOUT CONTRAST TECHNIQUE: Multiplanar, multiecho pulse sequences of the brain and surrounding structures were obtained without intravenous contrast. COMPARISON:  Head CT July 14, 2020. FINDINGS: The study is significantly degraded by motion and incomplete due to patient inability to lie still in the scanner for the duration of the study. Brain: No acute infarction, hemorrhage, hydrocephalus, extra-axial collection or mass lesion. Scattered and confluent foci of T2 hyperintensity within the white matter of the cerebral hemispheres, including deep, juxta cortical and periventricular white matter as well as corpus callosum, and within the pons. Small focus of tissue loss within the genu of the corpus callosum. Vascular: Normal flow voids. Skull and upper cervical spine: Grossly unremarkable. Sinuses/Orbits: Opacification of the right maxillary sinus and anterior right ethmoid cells. The orbits are maintained. IMPRESSION: 1. Mmotion degraded and incomplete examination due to patient inability to lie still in the scanner for the duration of the study. 2. No acute intracranial abnormality identified. 3. Scattered and confluent foci of T2 hyperintensity within the white matter of the cerebral hemispheres, including deep, juxta cortical and periventricular white matter as well as corpus callosum, and within the pons, and within the pons. Findings are nonspecific but may represent microvascular ischemic changes, sequela of prior infectious/inflammatory process or demyelinating disease. 4. Right maxillary and anterior ethmoid sinus disease. Electronically Signed   By: Pedro Earls M.D.   On: 07/14/2020 17:53   DG Chest  Portable 1 View  Result Date: 07/12/2020 CLINICAL DATA:  Increased shortness of breath. Missed dialysis treatment today. EXAM: PORTABLE CHEST 1 VIEW COMPARISON:  12/23/2019 FINDINGS: Cardiac silhouette is grossly normal in size, partly obscured by contiguous lung  opacities. No mediastinal or hilar masses. Lungs demonstrate bilateral irregular interstitial thickening with hazy opacity noted in the mid to lower lungs. Opacity at the lung bases partly obscures hemidiaphragms. Lungs are hyperexpanded. Scarring and changes of emphysema noted at the apices. Suspect small effusions.  No evidence of a pneumothorax. Skeletal structures are demineralized but grossly intact. IMPRESSION: 1. Findings consistent with volume overload with interstitial thickening and hazy mid to lower lung zone airspace opacities as well as probable small pleural effusions. Pneumonia should be considered if there are consistent clinical findings. The current changes are superimposed on chronic changes of COPD. Electronically Signed   By: Lajean Manes M.D.   On: 07/12/2020 17:54     CBC Recent Labs  Lab 07/12/20 1829 07/12/20 1835 07/13/20 0234 07/14/20 0721 07/14/20 2207 07/15/20 0540  WBC 9.6  --  8.6 5.1 5.1 5.2  HGB 12.2* 13.6  13.6 11.8* 10.2* 11.3* 12.1*  HCT 37.9* 40.0  40.0 35.9* 31.0* 33.8* 35.9*  PLT 233  --  262 215 247 265  MCV 102.4*  --  100.8* 100.6* 99.7 100.6*  MCH 33.0  --  33.1 33.1 33.3 33.9  MCHC 32.2  --  32.9 32.9 33.4 33.7  RDW 14.4  --  13.8 13.3 13.2 13.3  LYMPHSABS 0.6*  --   --   --   --   --   MONOABS 0.4  --   --   --   --   --   EOSABS 0.0  --   --   --   --   --   BASOSABS 0.0  --   --   --   --   --    Chemistries  Recent Labs  Lab 07/12/20 1829 07/12/20 1835 07/13/20 0234 07/13/20 2238 07/14/20 0721 07/14/20 2207 07/15/20 0540  NA 132*   < > 134* 128* 127* 128* 131*  K 4.8   < > 3.6 4.5 3.9 3.4* 3.6  CL 96*   < > 95* 91* 92* 90* 92*  CO2 20*  --  23 22 22 23 27   GLUCOSE 129*   < > 80 229* 151* 155* 121*  BUN 68*   < > 25* 45* 51* 18 22*  CREATININE 10.45*   < > 5.30* 7.63* 8.34* 4.18* 4.99*  CALCIUM 8.4*  --  8.7* 8.3* 8.2* 8.6* 8.8*  MG  --   --  1.9 2.1  --  1.8  --   AST 27  --  23 32  --  26  --   ALT 17  --  16 17  --  17   --   ALKPHOS 167*  --  153* 128*  --  124  --   BILITOT 1.2  --  1.4* 1.1  --  0.9  --    < > = values in this interval not displayed.   ------------------------------------------------------------------------------------------------------------------ No results for input(s): CHOL, HDL, LDLCALC, TRIG, CHOLHDL, LDLDIRECT in the last 72 hours.  No results found for: HGBA1C ------------------------------------------------------------------------------------------------------------------ No results for input(s): TSH, T4TOTAL, T3FREE, THYROIDAB in the last 72 hours.  Invalid input(s): FREET3 ------------------------------------------------------------------------------------------------------------------ No results for input(s): VITAMINB12, FOLATE, FERRITIN, TIBC, IRON, RETICCTPCT in the last 72 hours.  Coagulation profile Recent Labs  Lab 07/13/20 0234  INR 1.0    No results for input(s): DDIMER in the last 72 hours.  Cardiac Enzymes No results for input(s): CKMB, TROPONINI, MYOGLOBIN in the last 168 hours.  Invalid input(s): CK ------------------------------------------------------------------------------------------------------------------    Component Value Date/Time   BNP 1,679.0 (H) 09/02/2018 1325    Roxan Hockey M.D on 07/15/2020 at 2:43 PM  Go to [email or web address] - for contact info  [facility] - Office  [phone]

## 2020-07-15 NOTE — Progress Notes (Signed)
[facility] Progress Note    Assessment/ Plan:   1.  Acute hypoxic respiratory failure: Appears to be multifactorial with COPD exacerbation and volume overload.  Had some clinical improvement with HD on admission. On HCAP coverage with cefepime along with bronchodilators and Mucinex. 2. Encephalopathy, Alcohol withdrawal: on CIWA per primary. CTH and MRI negative for any acute pathologies 3.  End-stage renal disease: MWF at [facility]. S/p emergent HD on admit. Adherence to regimen is an issue here which we encouraged him to discuss his HD plan with his unit and outpatient nephrologist. HD planned for tomorrow 4.  Hypertension: Appears to have spotty adherence with outpatient antihypertensive therapy and appears to have been exacerbated by volume overload.  Uncontrolled due to withdrawals, BP now better with CIWA 5.  Secondary hyperparathyroidism: on phoslo and renvela. Phos acceptable, monitor daily 6.  Anemia of chronic kidney disease: Without overt loss, hemoglobin and hematocrit currently at goal and without indications for ESA. 7. Access: right RC AVF +b/t  Subjective:   On CIWA for etoh withdrawals. Drowsy right now but responds to vocal stimuli. 100% on RA. S/p hd yesterday with net uf 4.3L. BP better with UF and addressing withdrawals   Objective:   BP (!) 143/78   Pulse 84   Temp 97.8 F (36.6 C) (Oral)   Resp 17   Ht 5\' 10"  (1.778 m)   Wt 58.2 kg   SpO2 98%   BMI 18.41 kg/m   Intake/Output Summary (Last 24 hours) at 07/15/2020 0819 Last data filed at 07/14/2020 1515 Gross per 24 hour  Intake 849.86 ml  Output 4385 ml  Net -3535.14 ml   Weight change: 0 kg  Physical Exam: Gen:nad, laying flat in bed CVS:s1s2, rrr Resp:decrease breath sounds bibasilar, no w/r/r/c, unlabored, bl chest expansion, 100% on RA QMG:QQPY, nt/nd Ext:no edema Neuro: moves all ext spontaneously, drowsy, not following commands Access: rue rc avf +b/t  Imaging: DG Chest  2 View  Result Date: 07/14/2020 CLINICAL DATA:  Shortness of breath EXAM: CHEST - 2 VIEW COMPARISON:  07/12/2020 FINDINGS: Cardiomediastinal silhouette within normal limits. There is been interval improvement in aeration of the lungs. Some patchy opacity remains at the right lung base. IMPRESSION: Interval improvement in aeration of the lungs. Remaining opacity at the right lung base likely combination of airspace disease and small pleural effusion. Electronically Signed   By: Miachel Roux M.D.   On: 07/14/2020 16:20   CT HEAD WO CONTRAST  Result Date: 07/14/2020 CLINICAL DATA:  Altered mental status, difficulty speaking EXAM: CT HEAD WITHOUT CONTRAST TECHNIQUE: Contiguous axial images were obtained from the base of the skull through the vertex without intravenous contrast. COMPARISON:  None. FINDINGS: Brain: No evidence of acute infarction, hemorrhage, hydrocephalus, extra-axial collection or mass lesion/mass effect. Focal hypoattenuation of the periventricular white matter adjacent to the bilateral frontal horns with extension into the subcortical and deep white matter greater on the left than the right. Vascular: No hyperdense vessel or unexpected calcification. Skull: Normal. Negative for fracture or focal lesion. Sinuses/Orbits: Complete opacification of the right maxillary sinus. Intermediate to high density secretions. Slight hyperostosis of the posterior wall. Partial opacification of the right anterior ethmoid air cells as well. Other: None. IMPRESSION: 1. Focal white matter hypoattenuation in the inferior aspect of the left greater than right frontal lobes. This pattern is nonspecific but can be seen in the setting of prior traumatic brain injury. Alternately, chronic microvascular ischemic white matter disease, or an underlying demyelinating process  are additional considerations. 2. No evidence of acute intracranial hemorrhage or stroke. 3. Chronic appearing right maxillary sinusitis with inspissated  secretions. Electronically Signed   By: Jacqulynn Cadet M.D.   On: 07/14/2020 16:12   MR BRAIN WO CONTRAST  Result Date: 07/14/2020 CLINICAL DATA:  Mental status change, unknown cause. EXAM: MRI HEAD WITHOUT CONTRAST TECHNIQUE: Multiplanar, multiecho pulse sequences of the brain and surrounding structures were obtained without intravenous contrast. COMPARISON:  Head CT July 14, 2020. FINDINGS: The study is significantly degraded by motion and incomplete due to patient inability to lie still in the scanner for the duration of the study. Brain: No acute infarction, hemorrhage, hydrocephalus, extra-axial collection or mass lesion. Scattered and confluent foci of T2 hyperintensity within the white matter of the cerebral hemispheres, including deep, juxta cortical and periventricular white matter as well as corpus callosum, and within the pons. Small focus of tissue loss within the genu of the corpus callosum. Vascular: Normal flow voids. Skull and upper cervical spine: Grossly unremarkable. Sinuses/Orbits: Opacification of the right maxillary sinus and anterior right ethmoid cells. The orbits are maintained. IMPRESSION: 1. Mmotion degraded and incomplete examination due to patient inability to lie still in the scanner for the duration of the study. 2. No acute intracranial abnormality identified. 3. Scattered and confluent foci of T2 hyperintensity within the white matter of the cerebral hemispheres, including deep, juxta cortical and periventricular white matter as well as corpus callosum, and within the pons, and within the pons. Findings are nonspecific but may represent microvascular ischemic changes, sequela of prior infectious/inflammatory process or demyelinating disease. 4. Right maxillary and anterior ethmoid sinus disease. Electronically Signed   By: Pedro Earls M.D.   On: 07/14/2020 17:53    Labs: BMET Recent Labs  Lab 07/12/20 1829 07/12/20 1835 07/12/20 1956 07/13/20 0234  07/13/20 2238 07/14/20 0721 07/14/20 2207 07/15/20 0540  NA 132* 130*  130*  --  134* 128* 127* 128* 131*  K 4.8 7.4*  7.4* 5.3* 3.6 4.5 3.9 3.4* 3.6  CL 96* 104  104  --  95* 91* 92* 90* 92*  CO2 20*  --   --  23 22 22 23 27   GLUCOSE 129* 126*  126*  --  80 229* 151* 155* 121*  BUN 68* 104*  104*  --  25* 45* 51* 18 22*  CREATININE 10.45* 10.60*  10.60*  --  5.30* 7.63* 8.34* 4.18* 4.99*  CALCIUM 8.4*  --   --  8.7* 8.3* 8.2* 8.6* 8.8*  PHOS  --   --   --  3.9  --   --  3.5 3.9   CBC Recent Labs  Lab 07/12/20 1829 07/12/20 1835 07/13/20 0234 07/14/20 0721 07/14/20 2207 07/15/20 0540  WBC 9.6  --  8.6 5.1 5.1 5.2  NEUTROABS 8.6*  --   --   --   --   --   HGB 12.2*   < > 11.8* 10.2* 11.3* 12.1*  HCT 37.9*   < > 35.9* 31.0* 33.8* 35.9*  MCV 102.4*  --  100.8* 100.6* 99.7 100.6*  PLT 233  --  262 215 247 265   < > = values in this interval not displayed.    Medications:    . amLODipine  10 mg Oral Daily  . calcium acetate  667 mg Oral Q breakfast  . carvedilol  25 mg Oral BID WC  . Chlorhexidine Gluconate Cloth  6 each Topical Q0600  . cloNIDine  0.1 mg Oral BID  . dextromethorphan-guaiFENesin  1 tablet Oral BID  . insulin aspart  5 Units Intravenous Once   And  . dextrose  1 ampule Intravenous Once  . diazepam  2 mg Oral TID  . feeding supplement  237 mL Oral BID BM  . folic acid  1 mg Oral Daily  . heparin  5,000 Units Subcutaneous Q8H  . hydrALAZINE  100 mg Oral TID  . LORazepam  1 mg Intravenous Once  . mometasone-formoterol  2 puff Inhalation BID  . multivitamin with minerals  1 tablet Oral Daily  . pantoprazole  40 mg Oral Daily  . predniSONE  50 mg Oral Q breakfast  . sevelamer carbonate  800 mg Oral TID WC  . thiamine  100 mg Oral Daily   Or  . thiamine  100 mg Intravenous Daily      Gean Quint, MD [facility] 07/15/2020, 8:19 AM

## 2020-07-15 NOTE — Care Management Important Message (Signed)
Important Message  Patient Details  Name: [facility] MRN: [id number] Date of Birth: 1960-08-11   Medicare Important Message Given:  Yes     Tommy Medal 07/15/2020, 1:15 PM

## 2020-07-16 ENCOUNTER — Telehealth: Payer: Self-pay | Admitting: *Deleted

## 2020-07-16 DIAGNOSIS — F10231 Alcohol dependence with withdrawal delirium: Secondary | ICD-10-CM | POA: Diagnosis not present

## 2020-07-16 LAB — GLUCOSE, CAPILLARY
Glucose-Capillary: 163 mg/dL — ABNORMAL HIGH (ref 70–99)
Glucose-Capillary: 167 mg/dL — ABNORMAL HIGH (ref 70–99)
Glucose-Capillary: 213 mg/dL — ABNORMAL HIGH (ref 70–99)

## 2020-07-16 LAB — PTH, INTACT AND CALCIUM
Calcium, Total (PTH): 8.6 mg/dL — ABNORMAL LOW (ref 8.7–10.2)
PTH: 161 pg/mL — ABNORMAL HIGH (ref 15–65)

## 2020-07-16 MED ORDER — AMLODIPINE BESYLATE 10 MG PO TABS: 10.0000 mg | ORAL_TABLET | Freq: Every day | ORAL | 3 refills | Status: AC

## 2020-07-16 MED ORDER — AMOXICILLIN-POT CLAVULANATE 500-125 MG PO TABS: 1.0000 | ORAL_TABLET | Freq: Every evening | ORAL | 0 refills | Status: AC

## 2020-07-16 MED ORDER — LORAZEPAM 2 MG/ML IJ SOLN
1.0000 mg | Freq: Once | INTRAMUSCULAR | Status: AC
  Administered 2020-07-16: 1 mg via INTRAVENOUS
  Filled 2020-07-16: qty 1

## 2020-07-16 MED ORDER — AMOXICILLIN-POT CLAVULANATE 500-125 MG PO TABS
1.0000 | ORAL_TABLET | Freq: Once | ORAL | Status: AC
  Administered 2020-07-16: 500 mg via ORAL
  Filled 2020-07-16: qty 1

## 2020-07-16 MED ORDER — PREDNISONE 20 MG PO TABS: 40.0000 mg | ORAL_TABLET | Freq: Every day | ORAL | Status: DC

## 2020-07-16 NOTE — Progress Notes (Signed)
Patient Demographics:    Luke Mueller, is a 60 y.o. male, DOB - 1960/12/19, NWG:[id number]  Admit date - 07/12/2020   Admitting Physician Bernadette Hoit, DO  Outpatient Primary MD for the patient is Rosita Fire, MD  LOS - 4   Chief Complaint  Patient presents with  . Shortness of Breath        Subjective:    Luke Mueller today has no fevers, no emesis,  No chest pain,   -Confusion, delirium tremens symptoms improving slowly with benzos -Unfortunately patient did not receive his IV Ativan overnight and so he is a bit more confused, restless and hallucinating today -  Assessment  & Plan :    Principal Problem:   Delirium tremens ([place]) Active Problems:   ESRD (end stage renal disease) ([place])   Acute respiratory failure with hypoxia ([place])   COPD  GOLD 2/ active smoker AB hx    Hyperkalemia   Cigarette smoker   Pneumonia   Volume overload   Hypoalbuminemia   Prolonged QT interval  Brief Summary:- 60 y.o. male with medical history significant for COPD, HTN, PVD, GERD, ESRD on HD (MWF) admitted on 07/12/20 with dyspnea and acute hypoxic respiratory failure in the setting of Missed HD session---  -Went into DTs on 07/14/2020 -Anticipate discharge home in 1 to 2 days if DT symptoms continue to improve  A/p 1) acute hypoxic respiratory failure secondary to volume overload in the setting of missed HD session -Exam and imaging studies suggest sepsis secondary to  underlying CAP/pneumonia -Patient had HD session on 07/12/2020 ,plan is for additional HD session on 07/14/2020  Treated with Vanco and cefepime for now (No leukocytosis or high-grade fevers) -Last dose of cefepime 07/17/2020 - repeat chest x-ray after HD session on 07/14/2020 with improved aeration, still concerns about right-sided pneumonia -Patient has been weaned off BiPAP, -We will attempt to wean off O2 currently requiring 2 L via nasal  cannula -Patient met criteria for sepsis due to right-sided pneumonia- sepsis -POA -Sepsis pathophysiology appears to be mostly resolved  2)Delirium Tremens--- DT symptoms including hallucinations and agitation persist -Confusion, delirium tremens symptoms improving slowly with benzos -Unfortunately patient did not receive his IV Ativan overnight and so he is a bit more confused, restless and hallucinating today -Place patient on IV Precedex -Continue IV Ativan per CIWA protocol -Continue multivitamin/thiamine/folic acid as ordered  3)Social/Ethics--patient and his mother are requested a note stating that he is currently admitted to the hospital- --patient was to appear for court 07/13/20 and needs documentation sent to the courthouse proving he is in the hospital. She states otherwise a bench warrant may be issued for the patient for being a no-show.   fax number [phone], attention: Criminal Department.  4)Hypertensive crisis-BP improved on nitro drip -Patient has been weaned off nitro drip -Anticipate further improvement in BP with improvement in delirium tremens -c/n  amlodipine 10 mg daily, Coreg 25 mg twice daily and hydralazine 100 mg 3 times daily  5) COPD/ongoing tobacco abuse---- c/n  [name] as ordered, prednisone as ordered, supplemental oxygen as above #1 -Patient declines nicotine patch  6) chronic anemia of ESRD--- hemoglobin stable close to baseline, Procrit/ESA agent per nephrology team  7)ESRD--HD as above #1 (usually M/W/F) Discussed  with Dr Zachery Dauer -Last HD 07/14/2020, next HD 07/16/2020 -Continue Renvela, PhosLo  Disposition/Need for in-Hospital Stay- patient unable to be discharged at this time due to --acute hypoxic respiratory failure  And sepsis--requires IV antibiotics and additional HD sessions to address volume status, -Requires IV Ativan for DTs  Status is: Inpatient  Remains inpatient appropriate because:Please see above,   Disposition: The patient  is from: Home              Anticipated d/c is to: Home              Anticipated d/c date is: 1 day              Patient currently is not medically stable to d/c. Barriers: Not Clinically Stable-   Code Status :  -  Code Status: Full Code   Family Communication:   Discussed with his mother  Consults  :  Nephrology  DVT Prophylaxis  :   - SCDs  heparin injection 5,000 Units Start: 07/12/20 2300 SCDs Start: 07/12/20 2208  Lab Results  Component Value Date   PLT 265 07/15/2020   Inpatient Medications  Scheduled Meds: . amLODipine  10 mg Oral Daily  . calcium acetate  667 mg Oral Q breakfast  . carvedilol  25 mg Oral BID WC  . Chlorhexidine Gluconate Cloth  6 each Topical Q0600  . cloNIDine  0.1 mg Oral BID  . dextromethorphan-guaiFENesin  1 tablet Oral BID  . insulin aspart  5 Units Intravenous Once   And  . dextrose  1 ampule Intravenous Once  . diazepam  2 mg Oral TID  . feeding supplement  237 mL Oral BID BM  . folic acid  1 mg Oral Daily  . heparin  5,000 Units Subcutaneous Q8H  . hydrALAZINE  100 mg Oral TID  . mometasone-formoterol  2 puff Inhalation BID  . multivitamin with minerals  1 tablet Oral Daily  . pantoprazole  40 mg Oral Daily  . predniSONE  50 mg Oral Q breakfast  . sevelamer carbonate  800 mg Oral TID WC  . thiamine  100 mg Oral Daily   Or  . thiamine  100 mg Intravenous Daily   Continuous Infusions: . sodium chloride    . sodium chloride    . calcium gluconate    . ceFEPime (MAXIPIME) IV 1 g (07/15/20 2251)   PRN Meds:.sodium chloride, sodium chloride, acetaminophen, heparin, labetalol, lidocaine (PF), lidocaine-prilocaine, LORazepam **OR** LORazepam, pentafluoroprop-tetrafluoroeth    Anti-infectives (From admission, onward)   Start     Dose/Rate Route Frequency Ordered Stop   07/14/20 1530  amoxicillin-clavulanate (AUGMENTIN) 500-125 MG per tablet 500 mg        1 tablet Oral Once 07/14/20 1440 07/14/20 1536   07/14/20 1200  vancomycin  (VANCOCIN) IVPB 500 mg/100 ml premix  Status:  Discontinued        500 mg 100 mL/hr over 60 Minutes Intravenous Every M-W-F (Hemodialysis) 07/13/20 0119 07/13/20 0959   07/14/20 0000  amoxicillin-clavulanate (AUGMENTIN) 500-125 MG tablet        1 tablet Oral Every M-W-F (1800) 07/14/20 1445 07/17/20 2359   07/13/20 2200  ceFEPIme (MAXIPIME) 1 g in sodium chloride 0.9 % 100 mL IVPB        1 g 200 mL/hr over 30 Minutes Intravenous Every 24 hours 07/13/20 0119     07/13/20 0215  vancomycin (VANCOREADY) IVPB 500 mg/100 mL        500 mg 100 mL/hr over  60 Minutes Intravenous  Once 07/13/20 0115 07/13/20 0415   07/13/20 0215  ceFEPIme (MAXIPIME) 2 g in sodium chloride 0.9 % 100 mL IVPB        2 g 200 mL/hr over 30 Minutes Intravenous  Once 07/13/20 0115 07/13/20 0309   07/12/20 1900  vancomycin (VANCOREADY) IVPB 1250 mg/250 mL        1,250 mg 166.7 mL/hr over 90 Minutes Intravenous  Once 07/12/20 1857 07/12/20 2127   07/12/20 1815  vancomycin (VANCOCIN) IVPB 1000 mg/200 mL premix  Status:  Discontinued        1,000 mg 200 mL/hr over 60 Minutes Intravenous  Once 07/12/20 1802 07/12/20 1857   07/12/20 1815  ceFEPIme (MAXIPIME) 2 g in sodium chloride 0.9 % 100 mL IVPB        2 g 200 mL/hr over 30 Minutes Intravenous  Once 07/12/20 1802 07/12/20 1953        Objective:   Vitals:   07/16/20 0007 07/16/20 0500 07/16/20 0528 07/16/20 0907  BP: 127/68  136/86   Pulse: 85  88   Resp: 18  18   Temp: 98.2 F (36.8 C)  98.1 F (36.7 C)   TempSrc:      SpO2: 94%  95% 95%  Weight:  47.2 kg    Height:        Wt Readings from Last 3 Encounters:  07/16/20 47.2 kg  04/29/20 54 kg  04/01/20 54.1 kg     Intake/Output Summary (Last 24 hours) at 07/16/2020 1334 Last data filed at 07/15/2020 2159 Gross per 24 hour  Intake 250 ml  Output --  Net 250 ml   Temp:  [97.4 F (36.3 C)-98.2 F (36.8 C)] 98.1 F (36.7 C) (01/07 0528) Pulse Rate:  [71-88] 88 (01/07 0528) Resp:  [15-20] 18 (01/07  0528) BP: (102-170)/(56-93) 136/86 (01/07 0528) SpO2:  [94 %-100 %] 95 % (01/07 0907) Weight:  [47.2 kg] 47.2 kg (01/07 0500)  Physical Exam  Gen:-Episodes of confusion and disorientation  HEENT:- [place].AT, No sclera icterus Nose- [place] 2L/min Neck-Supple Neck, ,.  Lungs-improving air movement, no significant wheezing  CV- S1, S2 normal, regular Abd-  +ve B.Sounds, Abd Soft, No tenderness,    Extremity/Skin:- No  edema, pedal pulses present  Psych-confused, disoriented, agitated, restless, psychotic/10 due to delirium tremens  neuro-generalized weakness, no new focal deficits, +ve tremors MSK-right upper extremity AV fistula positive thrill and bruit   Data Review:   Micro Results Recent Results (from the past 240 hour(s))  Resp Panel by RT-PCR (Flu A&B, Covid) Nasopharyngeal Swab     Status: None   Collection Time: 07/12/20  5:20 PM   Specimen: Nasopharyngeal Swab; Nasopharyngeal(NP) swabs in vial transport medium  Result Value Ref Range Status   SARS Coronavirus 2 by RT PCR NEGATIVE NEGATIVE Final    Comment: (NOTE) SARS-CoV-2 target nucleic acids are NOT DETECTED.  The SARS-CoV-2 RNA is generally detectable in upper respiratory specimens during the acute phase of infection. The lowest concentration of SARS-CoV-2 viral copies this assay can detect is 138 copies/mL. A negative result does not preclude SARS-Cov-2 infection and should not be used as the sole basis for treatment or other patient management decisions. A negative result may occur with  improper specimen collection/handling, submission of specimen other than nasopharyngeal swab, presence of viral mutation(s) within the areas targeted by this assay, and inadequate number of viral copies(<138 copies/mL). A negative result must be combined with clinical observations, patient history, and epidemiological information.  The expected result is Negative.  Fact Sheet for Patients:   EntrepreneurPulse.com.au  Fact Sheet for Healthcare Providers:  IncredibleEmployment.be  This test is no t yet approved or cleared by the [place] FDA and  has been authorized for detection and/or diagnosis of SARS-CoV-2 by FDA under an Emergency Use Authorization (EUA). This EUA will remain  in effect (meaning this test can be used) for the duration of the COVID-19 declaration under Section 564(b)(1) of the Act, 21 U.S.C.section 360bbb-3(b)(1), unless the authorization is terminated  or revoked sooner.       Influenza A by PCR NEGATIVE NEGATIVE Final   Influenza B by PCR NEGATIVE NEGATIVE Final    Comment: (NOTE) The Xpert Xpress SARS-CoV-2/FLU/RSV plus assay is intended as an aid in the diagnosis of influenza from Nasopharyngeal swab specimens and should not be used as a sole basis for treatment. Nasal washings and aspirates are unacceptable for Xpert Xpress SARS-CoV-2/FLU/RSV testing.  Fact Sheet for Patients: EntrepreneurPulse.com.au  Fact Sheet for Healthcare Providers: IncredibleEmployment.be  This test is not yet approved or cleared by the [place] FDA and has been authorized for detection and/or diagnosis of SARS-CoV-2 by FDA under an Emergency Use Authorization (EUA). This EUA will remain in effect (meaning this test can be used) for the duration of the COVID-19 declaration under Section 564(b)(1) of the Act, 21 U.S.C. section 360bbb-3(b)(1), unless the authorization is terminated or revoked.  Performed at [facility], [address] [place], [place] [address]   Culture, blood (routine x 2)     Status: None (Preliminary result)   Collection Time: 07/12/20  6:29 PM   Specimen: Left Antecubital; Blood  Result Value Ref Range Status   Specimen Description LEFT ANTECUBITAL  Final   Special Requests   Final    BOTTLES DRAWN AEROBIC AND ANAEROBIC Blood Culture adequate volume   Culture    Final    NO GROWTH 4 DAYS Performed at [facility], [address] [place], [place] [address]    Report Status PENDING  Incomplete  Culture, blood (routine x 2)     Status: None (Preliminary result)   Collection Time: 07/12/20  7:51 PM   Specimen: BLOOD LEFT FOREARM  Result Value Ref Range Status   Specimen Description BLOOD LEFT FOREARM  Final   Special Requests   Final    BOTTLES DRAWN AEROBIC AND ANAEROBIC Blood Culture adequate volume   Culture   Final    NO GROWTH 4 DAYS Performed at [facility], [address], [place],  [address]    Report Status PENDING  Incomplete  MRSA PCR Screening     Status: None   Collection Time: 07/12/20  9:26 PM   Specimen: Nasal Mucosa; Nasopharyngeal  Result Value Ref Range Status   MRSA by PCR NEGATIVE NEGATIVE Final    Comment:        The GeneXpert MRSA Assay (FDA approved for NASAL specimens only), is one component of a comprehensive MRSA colonization surveillance program. It is not intended to diagnose MRSA infection nor to guide or monitor treatment for MRSA infections. Performed at [facility], [address] [place],  [address]     Radiology Reports DG Chest 2 View  Result Date: 07/14/2020 CLINICAL DATA:  Shortness of breath EXAM: CHEST - 2 VIEW COMPARISON:  07/12/2020 FINDINGS: Cardiomediastinal silhouette within normal limits. There is been interval improvement in aeration of the lungs. Some patchy opacity remains at the right lung base. IMPRESSION: Interval improvement in aeration of  the lungs. Remaining opacity at the right lung base likely combination of airspace disease and small pleural effusion. Electronically Signed   By: Miachel Roux M.D.   On: 07/14/2020 16:20   CT HEAD WO CONTRAST  Result Date: 07/14/2020 CLINICAL DATA:  Altered mental status, difficulty speaking EXAM: CT HEAD WITHOUT CONTRAST TECHNIQUE: Contiguous axial images were obtained from the base of the skull through the vertex without  intravenous contrast. COMPARISON:  None. FINDINGS: Brain: No evidence of acute infarction, hemorrhage, hydrocephalus, extra-axial collection or mass lesion/mass effect. Focal hypoattenuation of the periventricular white matter adjacent to the bilateral frontal horns with extension into the subcortical and deep white matter greater on the left than the right. Vascular: No hyperdense vessel or unexpected calcification. Skull: Normal. Negative for fracture or focal lesion. Sinuses/Orbits: Complete opacification of the right maxillary sinus. Intermediate to high density secretions. Slight hyperostosis of the posterior wall. Partial opacification of the right anterior ethmoid air cells as well. Other: None. IMPRESSION: 1. Focal white matter hypoattenuation in the inferior aspect of the left greater than right frontal lobes. This pattern is nonspecific but can be seen in the setting of prior traumatic brain injury. Alternately, chronic microvascular ischemic white matter disease, or an underlying demyelinating process are additional considerations. 2. No evidence of acute intracranial hemorrhage or stroke. 3. Chronic appearing right maxillary sinusitis with inspissated secretions. Electronically Signed   By: Jacqulynn Cadet M.D.   On: 07/14/2020 16:12   MR BRAIN WO CONTRAST  Result Date: 07/14/2020 CLINICAL DATA:  Mental status change, unknown cause. EXAM: MRI HEAD WITHOUT CONTRAST TECHNIQUE: Multiplanar, multiecho pulse sequences of the brain and surrounding structures were obtained without intravenous contrast. COMPARISON:  Head CT July 14, 2020. FINDINGS: The study is significantly degraded by motion and incomplete due to patient inability to lie still in the scanner for the duration of the study. Brain: No acute infarction, hemorrhage, hydrocephalus, extra-axial collection or mass lesion. Scattered and confluent foci of T2 hyperintensity within the white matter of the cerebral hemispheres, including deep, juxta  cortical and periventricular white matter as well as corpus callosum, and within the pons. Small focus of tissue loss within the genu of the corpus callosum. Vascular: Normal flow voids. Skull and upper cervical spine: Grossly unremarkable. Sinuses/Orbits: Opacification of the right maxillary sinus and anterior right ethmoid cells. The orbits are maintained. IMPRESSION: 1. Mmotion degraded and incomplete examination due to patient inability to lie still in the scanner for the duration of the study. 2. No acute intracranial abnormality identified. 3. Scattered and confluent foci of T2 hyperintensity within the white matter of the cerebral hemispheres, including deep, juxta cortical and periventricular white matter as well as corpus callosum, and within the pons, and within the pons. Findings are nonspecific but may represent microvascular ischemic changes, sequela of prior infectious/inflammatory process or demyelinating disease. 4. Right maxillary and anterior ethmoid sinus disease. Electronically Signed   By: Pedro Earls M.D.   On: 07/14/2020 17:53   DG Chest Portable 1 View  Result Date: 07/12/2020 CLINICAL DATA:  Increased shortness of breath. Missed dialysis treatment today. EXAM: PORTABLE CHEST 1 VIEW COMPARISON:  12/23/2019 FINDINGS: Cardiac silhouette is grossly normal in size, partly obscured by contiguous lung opacities. No mediastinal or hilar masses. Lungs demonstrate bilateral irregular interstitial thickening with hazy opacity noted in the mid to lower lungs. Opacity at the lung bases partly obscures hemidiaphragms. Lungs are hyperexpanded. Scarring and changes of emphysema noted at the apices. Suspect small effusions.  No evidence of a pneumothorax. Skeletal structures are demineralized but grossly intact. IMPRESSION: 1. Findings consistent with volume overload with interstitial thickening and hazy mid to lower lung zone airspace opacities as well as probable small pleural  effusions. Pneumonia should be considered if there are consistent clinical findings. The current changes are superimposed on chronic changes of COPD. Electronically Signed   By: Lajean Manes M.D.   On: 07/12/2020 17:54     CBC Recent Labs  Lab 07/12/20 1829 07/12/20 1835 07/13/20 0234 07/14/20 0721 07/14/20 2207 07/15/20 0540  WBC 9.6  --  8.6 5.1 5.1 5.2  HGB 12.2* 13.6  13.6 11.8* 10.2* 11.3* 12.1*  HCT 37.9* 40.0  40.0 35.9* 31.0* 33.8* 35.9*  PLT 233  --  262 215 247 265  MCV 102.4*  --  100.8* 100.6* 99.7 100.6*  MCH 33.0  --  33.1 33.1 33.3 33.9  MCHC 32.2  --  32.9 32.9 33.4 33.7  RDW 14.4  --  13.8 13.3 13.2 13.3  LYMPHSABS 0.6*  --   --   --   --   --   MONOABS 0.4  --   --   --   --   --   EOSABS 0.0  --   --   --   --   --   BASOSABS 0.0  --   --   --   --   --    Chemistries  Recent Labs  Lab 07/12/20 1829 07/12/20 1835 07/13/20 0234 07/13/20 2238 07/14/20 0721 07/14/20 2207 07/15/20 0540  NA 132*   < > 134* 128* 127* 128* 131*  K 4.8   < > 3.6 4.5 3.9 3.4* 3.6  CL 96*   < > 95* 91* 92* 90* 92*  CO2 20*  --  _0 GLUCOSE 129*   < > 80 229* 151* 155* 121*  BUN 68*   < > 25* 45* 51* 18 22*  CREATININE 10.45*   < > 5.30* 7.63* 8.34* 4.18* 4.99*  CALCIUM 8.4*  --  8.7* 8.3* 8.2* 8.6* 8.8*  8.6*  MG  --   --  1.9 2.1  --  1.8  --   AST 27  --  23 32  --  26  --   ALT 17  --  16 17  --  17  --   ALKPHOS 167*  --  153* 128*  --  124  --   BILITOT 1.2  --  1.4* 1.1  --  0.9  --    < > = values in this interval not displayed.   ------------------------------------------------------------------------------------------------------------------ No results for input(s): CHOL, HDL, LDLCALC, TRIG, CHOLHDL, LDLDIRECT in the last 72 hours.  No results found for: HGBA1C ------------------------------------------------------------------------------------------------------------------ No results for input(s): TSH, T4TOTAL, T3FREE, THYROIDAB in the last 72  hours.  Invalid input(s): FREET3 ------------------------------------------------------------------------------------------------------------------ No results for input(s): VITAMINB12, FOLATE, FERRITIN, TIBC, IRON, RETICCTPCT in the last 72 hours.  Coagulation profile Recent Labs  Lab 07/13/20 0234  INR 1.0    No results for input(s): DDIMER in the last 72 hours.  Cardiac Enzymes No results for input(s): CKMB, TROPONINI, MYOGLOBIN in the last 168 hours.  Invalid input(s): CK ------------------------------------------------------------------------------------------------------------------    Component Value Date/Time   BNP 1,679.0 (H) 09/02/2018 1325    Roxan Hockey M.D on 07/16/2020 at 1:34 PM  Go to [email or web address] - for contact info  [facility] - Office  [phone]

## 2020-07-16 NOTE — Care Management Important Message (Signed)
Important Message  Patient Details  Name: Luke Mueller MRN: [id number] Date of Birth: Feb 07, 1961   Medicare Important Message Given:  Yes     Tommy Medal 07/16/2020, 11:55 AM

## 2020-07-16 NOTE — Progress Notes (Signed)
Patient often rings call light for food, snacks and complains of being "doped up", patient became agitated with nurse and I when we go in the room. He will state he is cold, never received anything to eat or drink, even while he is eating and snacking in front of [place]. Patient also stated he was in ICU and stated he hasn't eaten since being in ICU. We would explain to patient that he ate dinner with tray was still in front of him, but patient denied that he had food in front of him. Patient stated we had attitudes, but I explained to the patient he cursed at staff several times when we kept asking him what we could do to help. Patient will keep asking for graham crackers, peanut butter and candy.

## 2020-07-16 NOTE — Progress Notes (Signed)
[facility] Progress Note    Assessment/ Plan:   1.  Acute hypoxic respiratory failure: Appears to be multifactorial with COPD exacerbation and volume overload.  Had some clinical improvement with HD on admission. On HCAP coverage with cefepime along with bronchodilators and Mucinex. 95% on RA 2. Encephalopathy, Alcohol withdrawal: on CIWA per primary. CTH and MRI negative for any acute pathologies 3.  End-stage renal disease: MWF at [facility]. S/p emergent HD on admit. Adherence to regimen is an issue here which we encouraged him to discuss his HD plan with his unit and outpatient nephrologist. HD planned for today 4.  Hypertension: Appears to have spotty adherence with outpatient antihypertensive therapy and appears to have been exacerbated by volume overload.  Uncontrolled due to withdrawals, BP now better with CIWA 5.  Secondary hyperparathyroidism: on phoslo and renvela. Phos acceptable, monitor daily 6.  Anemia of chronic kidney disease: Without overt loss, hemoglobin and hematocrit currently at goal and without indications for ESA. 7. Access: right RC AVF +b/t  Subjective:   Agitated, in withdrawals especially overnight. No other acute events.   Objective:   BP 136/86 (BP Location: Left Arm)   Pulse 88   Temp 98.1 F (36.7 C)   Resp 18   Ht 5\' 10"  (1.778 m)   Wt 47.2 kg   SpO2 95%   BMI 14.93 kg/m   Intake/Output Summary (Last 24 hours) at 07/16/2020 1248 Last data filed at 07/15/2020 2159 Gross per 24 hour  Intake 250 ml  Output --  Net 250 ml   Weight change: -11.2 kg  Physical Exam: Gen:nad, sitting up in bed CVS:s1s2, rrr Resp: unlabored, normal WOB, speaking in full sentences RKY:HCWC, nt/nd Ext:no edema Neuro: moves all ext spontaneously, awake, agitated, speech clear and coherent Access: rue rc avf +b/t  Imaging: DG Chest 2 View  Result Date: 07/14/2020 CLINICAL DATA:  Shortness of breath EXAM: CHEST - 2 VIEW COMPARISON:  07/12/2020  FINDINGS: Cardiomediastinal silhouette within normal limits. There is been interval improvement in aeration of the lungs. Some patchy opacity remains at the right lung base. IMPRESSION: Interval improvement in aeration of the lungs. Remaining opacity at the right lung base likely combination of airspace disease and small pleural effusion. Electronically Signed   By: Miachel Roux M.D.   On: 07/14/2020 16:20   CT HEAD WO CONTRAST  Result Date: 07/14/2020 CLINICAL DATA:  Altered mental status, difficulty speaking EXAM: CT HEAD WITHOUT CONTRAST TECHNIQUE: Contiguous axial images were obtained from the base of the skull through the vertex without intravenous contrast. COMPARISON:  None. FINDINGS: Brain: No evidence of acute infarction, hemorrhage, hydrocephalus, extra-axial collection or mass lesion/mass effect. Focal hypoattenuation of the periventricular white matter adjacent to the bilateral frontal horns with extension into the subcortical and deep white matter greater on the left than the right. Vascular: No hyperdense vessel or unexpected calcification. Skull: Normal. Negative for fracture or focal lesion. Sinuses/Orbits: Complete opacification of the right maxillary sinus. Intermediate to high density secretions. Slight hyperostosis of the posterior wall. Partial opacification of the right anterior ethmoid air cells as well. Other: None. IMPRESSION: 1. Focal white matter hypoattenuation in the inferior aspect of the left greater than right frontal lobes. This pattern is nonspecific but can be seen in the setting of prior traumatic brain injury. Alternately, chronic microvascular ischemic white matter disease, or an underlying demyelinating process are additional considerations. 2. No evidence of acute intracranial hemorrhage or stroke. 3. Chronic appearing right maxillary sinusitis with inspissated  secretions. Electronically Signed   By: Jacqulynn Cadet M.D.   On: 07/14/2020 16:12   MR BRAIN WO  CONTRAST  Result Date: 07/14/2020 CLINICAL DATA:  Mental status change, unknown cause. EXAM: MRI HEAD WITHOUT CONTRAST TECHNIQUE: Multiplanar, multiecho pulse sequences of the brain and surrounding structures were obtained without intravenous contrast. COMPARISON:  Head CT July 14, 2020. FINDINGS: The study is significantly degraded by motion and incomplete due to patient inability to lie still in the scanner for the duration of the study. Brain: No acute infarction, hemorrhage, hydrocephalus, extra-axial collection or mass lesion. Scattered and confluent foci of T2 hyperintensity within the white matter of the cerebral hemispheres, including deep, juxta cortical and periventricular white matter as well as corpus callosum, and within the pons. Small focus of tissue loss within the genu of the corpus callosum. Vascular: Normal flow voids. Skull and upper cervical spine: Grossly unremarkable. Sinuses/Orbits: Opacification of the right maxillary sinus and anterior right ethmoid cells. The orbits are maintained. IMPRESSION: 1. Mmotion degraded and incomplete examination due to patient inability to lie still in the scanner for the duration of the study. 2. No acute intracranial abnormality identified. 3. Scattered and confluent foci of T2 hyperintensity within the white matter of the cerebral hemispheres, including deep, juxta cortical and periventricular white matter as well as corpus callosum, and within the pons, and within the pons. Findings are nonspecific but may represent microvascular ischemic changes, sequela of prior infectious/inflammatory process or demyelinating disease. 4. Right maxillary and anterior ethmoid sinus disease. Electronically Signed   By: Pedro Earls M.D.   On: 07/14/2020 17:53    Labs: BMET Recent Labs  Lab 07/12/20 1829 07/12/20 1835 07/12/20 1956 07/13/20 0234 07/13/20 2238 07/14/20 0721 07/14/20 2207 07/15/20 0540  NA 132* 130*  130*  --  134* 128* 127*  128* 131*  K 4.8 7.4*  7.4* 5.3* 3.6 4.5 3.9 3.4* 3.6  CL 96* 104  104  --  95* 91* 92* 90* 92*  CO2 20*  --   --  23 22 22 23 27   GLUCOSE 129* 126*  126*  --  80 229* 151* 155* 121*  BUN 68* 104*  104*  --  25* 45* 51* 18 22*  CREATININE 10.45* 10.60*  10.60*  --  5.30* 7.63* 8.34* 4.18* 4.99*  CALCIUM 8.4*  --   --  8.7* 8.3* 8.2* 8.6* 8.8*  8.6*  PHOS  --   --   --  3.9  --   --  3.5 3.9   CBC Recent Labs  Lab 07/12/20 1829 07/12/20 1835 07/13/20 0234 07/14/20 0721 07/14/20 2207 07/15/20 0540  WBC 9.6  --  8.6 5.1 5.1 5.2  NEUTROABS 8.6*  --   --   --   --   --   HGB 12.2*   < > 11.8* 10.2* 11.3* 12.1*  HCT 37.9*   < > 35.9* 31.0* 33.8* 35.9*  MCV 102.4*  --  100.8* 100.6* 99.7 100.6*  PLT 233  --  262 215 247 265   < > = values in this interval not displayed.    Medications:    . amLODipine  10 mg Oral Daily  . calcium acetate  667 mg Oral Q breakfast  . carvedilol  25 mg Oral BID WC  . Chlorhexidine Gluconate Cloth  6 each Topical Q0600  . cloNIDine  0.1 mg Oral BID  . dextromethorphan-guaiFENesin  1 tablet Oral BID  . insulin aspart  5  Units Intravenous Once   And  . dextrose  1 ampule Intravenous Once  . diazepam  2 mg Oral TID  . feeding supplement  237 mL Oral BID BM  . folic acid  1 mg Oral Daily  . heparin  5,000 Units Subcutaneous Q8H  . hydrALAZINE  100 mg Oral TID  . mometasone-formoterol  2 puff Inhalation BID  . multivitamin with minerals  1 tablet Oral Daily  . pantoprazole  40 mg Oral Daily  . predniSONE  50 mg Oral Q breakfast  . sevelamer carbonate  800 mg Oral TID WC  . thiamine  100 mg Oral Daily   Or  . thiamine  100 mg Intravenous Daily      Gean Quint, MD [facility] 07/16/2020, 12:48 PM

## 2020-07-16 NOTE — Telephone Encounter (Signed)
Patient scheduled and [facility] nurse will tell him at discharge
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
## 2020-07-16 NOTE — Procedures (Signed)
   HEMODIALYSIS TREATMENT NOTE:  Uneventful HD session completed, but pt signed off after 3 hours.  3 liters removed.  Rockwell Alexandria, RN

## 2020-07-16 NOTE — Telephone Encounter (Signed)
Called endo and LMOVM to cancel. Stacey please schedule OV as stated below, Thanks!

## 2020-07-16 NOTE — TOC Initial Note (Signed)
Transition of Care [facility]) - Initial/Assessment Note    Patient Details  Name: Luke Mueller MRN: [id number] Date of Birth: [birth year]  [facility]) CM/SW Contact:    Boneta Lucks, RN Phone Number: 07/16/2020, 11:22 AM  Clinical Narrative:   Patient admitted with delirium tremens. Patient has high risk for readmission. Patient lives at home with mother, is independent at baseline. He drives himself and her as needed. He is dialysis patient. She does not drive. PT has no needs recommendations. TOC gave patient Substance Abuse resources.  Plan to get HD today and discharge home. Mother is working on transportation home.           Expected Discharge Plan: Home/Self Care Barriers to Discharge: No Barriers Identified  Patient Goals and CMS Choice Patient states their goals for this hospitalization and ongoing recovery are:: to return home. CMS Medicare.gov Compare Post Acute Care list provided to:: Patient Represenative (must comment) Choice offered to / list presented to : Parent  Expected Discharge Plan and Services Expected Discharge Plan: Home/Self Care  Living arrangements for the past 2 months: Single Family Home Expected Discharge Date: 07/14/20                Prior Living Arrangements/Services Living arrangements for the past 2 months: Single Family Home Lives with:: Parents Patient language and need for interpreter reviewed:: Yes        Need for Family Participation in Patient Care: Yes (Comment) Care giver support system in place?: Yes (comment)   Criminal Activity/Legal Involvement Pertinent to Current Situation/Hospitalization: No - Comment as needed  Activities of Daily Living Home Assistive Devices/Equipment: None ADL Screening (condition at time of admission) Patient's cognitive ability adequate to safely complete daily activities?: Yes Is the patient deaf or have difficulty hearing?: No Does the patient have difficulty seeing, even when wearing  glasses/contacts?: No Does the patient have difficulty concentrating, remembering, or making decisions?: No Patient able to express need for assistance with ADLs?: Yes Does the patient have difficulty dressing or bathing?: No Independently performs ADLs?: Yes (appropriate for developmental age) Does the patient have difficulty walking or climbing stairs?: No Weakness of Legs: None Weakness of Arms/Hands: None  Permission Sought/Granted      Share Information with NAME: [place] granted to share info w Relationship: Mother     Emotional Assessment    Alcohol / Substance Use: Alcohol Use Psych Involvement: No (comment)  Admission diagnosis:  Acute respiratory failure with hypoxia ([place]) [J96.01] Patient Active Problem List   Diagnosis Date Noted  . Delirium tremens ([place]) 07/15/2020  . Hypoalbuminemia 07/12/2020  . Prolonged QT interval 07/12/2020  . Hepatic cirrhosis ([place]) 04/29/2020  . Uremia of renal origin 09/22/2019  . Uremia 09/21/2019  . Generalized weakness 09/21/2019  . HBP (high blood pressure) 09/21/2019  . Anemia, macrocytic 07/31/2019  . Essential hypertension   . Volume overload 10/01/2018  . Anemia in ESRD (end-stage renal disease) ([place]) 10/01/2018  . Leukocytosis 10/01/2018  . Lobar pneumonia ([place]) 09/04/2018  . HCAP (healthcare-associated pneumonia)   . Pneumonia 09/02/2018  . ESRD (end stage renal disease) ([place]) 07/24/2018  . Nonspecific chest pain 07/24/2018  . Hyperkalemia 07/24/2018  . Acute respiratory failure with hypoxia ([place]) 07/24/2018  . Cigarette smoker   . Pulmonary edema 07/23/2018  . End stage renal disease ([place]) 04/08/2013  . PAD (peripheral artery disease) ([place]) 11/22/2012  . Claudication of right lower extremity ([place]) 11/22/2012  . CKD (chronic kidney disease)  stage 4, GFR 15-29 ml/min (HCC) 11/22/2012  . COPD  GOLD 2/ active smoker AB hx  11/22/2012  . HTN (hypertension), malignant 11/22/2012   PCP:  Rosita Fire,  MD Pharmacy:   [facility], [address] [place] [address] Phone: [phone] Fax: [phone]  [id number] - [place], [place] [address] [place] [address] Phone: [phone] Fax: [phone]   Readmission Risk Interventions Readmission Risk Prevention Plan 07/16/2020  Transportation Screening Complete  Medication Review ([place]) Complete  HRI or [place] Complete  SW Recovery Care/Counseling Consult Complete  [place] Not Applicable  Some recent data might be hidden

## 2020-07-16 NOTE — Discharge Instructions (Signed)
1)Very low-salt diet advised 2)Weigh yourself daily, call if you gain more than 3 pounds in 1 day or more than 5 pounds in 1 week as your diuretic medications and your hemodialysis dry weight may need to be adjusted 3)Limit your Fluid  intake to no more than 60 ounces (1.8 Liters) per day 4) please note that your blood pressure medications have been adjusted to help better control your blood pressure 5) please continue hemodialysis on Mondays Wednesdays and Fridays as previously advised 6) complete abstinence from alcohol strongly advised--- consider alcohol rehab treatment

## 2020-07-16 NOTE — Telephone Encounter (Signed)
-----   Message from Encarnacion Chu, RN sent at 07/16/2020  8:28 AM EST ----- Regarding: FW: Change of medical state  ----- Message ----- From: Eloise Harman, DO Sent: 07/15/2020   9:13 AM EST To: Encarnacion Chu, RN Subject: RE: Change of medical state                    Lets delay for now. Can we set up f/u visit with Cyril Mourning in 6 weeks to re evaluate things? Thanks  ----- Message ----- From: Encarnacion Chu, RN Sent: 07/14/2020  11:35 AM EST To: Eloise Harman, DO Subject: Change of medical state                        Good morning Dr Abbey Chatters. Luke Mueller is scheduled for a procedure on 07/20/2020, but is currently an inpateint with respiratory distress among other things. Will you please evaluate his chart and let me know if you still want to proceed on 07/20/2020? Thank you!

## 2020-07-16 NOTE — TOC Transition Note (Signed)
Transition of Care [facility]) - CM/SW Discharge Note   Patient Details  Name: Luke Mueller MRN: [id number] Date of Birth: Jan 28, 1961  [facility]) CM/SW Contact:  Boneta Lucks, RN Phone Number: 07/16/2020, 4:15 PM   Clinical Narrative:   Patient on dialysis at present, plans to be completed at [facility], discharge planning for tomorrow. Mother on the phone with MD and TOC, "I have to get him out to there tonight"  Patient states he is leaving after dialysis. He has been calling his mother all day. TOC can not provide any transportation after 5:30. Patient is aware he does not have to leave tonight. Patient states he will find a way home. MD will put in discharge order, up to patient to find a ride home. If unable TOC can call a cab in the morning.  Final next level of care: Home/Self Care Barriers to Discharge: No Barriers Identified   Patient Goals and CMS Choice Patient states their goals for this hospitalization and ongoing recovery are:: to return home. CMS Medicare.gov Compare Post Acute Care list provided to:: Patient Represenative (must comment) Choice offered to / list presented to : Parent  Discharge Placement                Patient to be transferred to facility by: Patient will find a ride. Name of family member notified: Mother - [place] Patient and family notified of of transfer: 07/16/20   Readmission Risk Interventions Readmission Risk Prevention Plan 07/16/2020  Transportation Screening Complete  Medication Review Press [employer]) Complete  HRI or [place] Complete  SW Recovery Care/Counseling Consult Complete  [place] Not Applicable  Some recent data might be hidden

## 2020-07-17 LAB — CULTURE, BLOOD (ROUTINE X 2)
Culture: NO GROWTH
Culture: NO GROWTH
Special Requests: ADEQUATE
Special Requests: ADEQUATE

## 2020-07-19 ENCOUNTER — Other Ambulatory Visit: Payer: Self-pay

## 2020-07-19 ENCOUNTER — Other Ambulatory Visit (HOSPITAL_COMMUNITY)
Admission: RE | Admit: 2020-07-19 | Discharge: 2020-07-19 | Disposition: A | Discharge status: Home / Self Care | Payer: Medicaid Other | Source: Ambulatory Visit | Attending: Internal Medicine | Admitting: Internal Medicine

## 2020-07-19 ENCOUNTER — Telehealth: Payer: Self-pay | Admitting: Internal Medicine

## 2020-07-19 NOTE — Telephone Encounter (Signed)
[phone]  Please call patient, he needs to reschedule his procedure.  He was admitted and has a lot of appointments he is not able to do.  Has a lot of questions

## 2020-07-19 NOTE — Progress Notes (Signed)
Please refer to [facility] note and the previous one. Pt has other appts. He needs to keep first.

## 2020-07-19 NOTE — Patient Outreach (Signed)
[facility]) Care Management  07/19/2020  LANEY BAGSHAW [birth year] [id number]     Transition of Care Referral  Referral Date: 07/19/2020  Referral Source: [facility] Discharge Report Date of Discharge: 07/16/2020 Facility: [place]: [facility]    Referral received. Transition of care calls being completed via EMMI-automated calls. RN CM will outreach patient for any red flags received.     Plan: RN CM will close case at this time.   Enzo Montgomery, RN,BSN,CCM [place] Management Telephonic Care Management Coordinator Direct Phone: [phone] Toll Free: [phone] Fax: [phone]

## 2020-07-19 NOTE — Telephone Encounter (Signed)
See prior note. Per Dr. Abbey Chatters patient will need to f/u with Aliene Altes PA-C prior to rescheduling. He can then be rescheduled at that time. Called pt and made aware. He voiced understanding.

## 2020-07-20 ENCOUNTER — Encounter (HOSPITAL_COMMUNITY): Admission: RE | Payer: Self-pay | Source: Home / Self Care

## 2020-07-20 ENCOUNTER — Ambulatory Visit (HOSPITAL_COMMUNITY): Admission: RE | Admit: 2020-07-20 | Payer: Medicaid Other | Source: Home / Self Care

## 2020-07-20 SURGERY — COLONOSCOPY WITH PROPOFOL
Anesthesia: Monitor Anesthesia Care

## 2020-07-21 DIAGNOSIS — N186 End stage renal disease: Secondary | ICD-10-CM | POA: Diagnosis not present

## 2020-07-21 DIAGNOSIS — Z992 Dependence on renal dialysis: Secondary | ICD-10-CM | POA: Diagnosis not present

## 2020-07-23 DIAGNOSIS — Z79899 Other long term (current) drug therapy: Secondary | ICD-10-CM | POA: Diagnosis not present

## 2020-07-23 DIAGNOSIS — N186 End stage renal disease: Secondary | ICD-10-CM | POA: Diagnosis not present

## 2020-07-23 DIAGNOSIS — Z992 Dependence on renal dialysis: Secondary | ICD-10-CM | POA: Diagnosis not present

## 2020-07-28 DIAGNOSIS — N186 End stage renal disease: Secondary | ICD-10-CM | POA: Diagnosis not present

## 2020-07-28 DIAGNOSIS — Z992 Dependence on renal dialysis: Secondary | ICD-10-CM | POA: Diagnosis not present

## 2020-08-02 DIAGNOSIS — N186 End stage renal disease: Secondary | ICD-10-CM | POA: Diagnosis not present

## 2020-08-02 DIAGNOSIS — Z992 Dependence on renal dialysis: Secondary | ICD-10-CM | POA: Diagnosis not present

## 2020-08-03 DIAGNOSIS — F17200 Nicotine dependence, unspecified, uncomplicated: Secondary | ICD-10-CM | POA: Diagnosis not present

## 2020-08-03 DIAGNOSIS — N186 End stage renal disease: Secondary | ICD-10-CM | POA: Diagnosis not present

## 2020-08-03 DIAGNOSIS — I10 Essential (primary) hypertension: Secondary | ICD-10-CM | POA: Diagnosis not present

## 2020-08-03 DIAGNOSIS — J449 Chronic obstructive pulmonary disease, unspecified: Secondary | ICD-10-CM | POA: Diagnosis not present

## 2020-08-04 DIAGNOSIS — N186 End stage renal disease: Secondary | ICD-10-CM | POA: Diagnosis not present

## 2020-08-04 DIAGNOSIS — Z992 Dependence on renal dialysis: Secondary | ICD-10-CM | POA: Diagnosis not present

## 2020-08-06 DIAGNOSIS — Z992 Dependence on renal dialysis: Secondary | ICD-10-CM | POA: Diagnosis not present

## 2020-08-06 DIAGNOSIS — N186 End stage renal disease: Secondary | ICD-10-CM | POA: Diagnosis not present

## 2020-08-09 DIAGNOSIS — N186 End stage renal disease: Secondary | ICD-10-CM | POA: Diagnosis not present

## 2020-08-09 DIAGNOSIS — Z992 Dependence on renal dialysis: Secondary | ICD-10-CM | POA: Diagnosis not present

## 2020-08-11 DIAGNOSIS — N186 End stage renal disease: Secondary | ICD-10-CM | POA: Diagnosis not present

## 2020-08-11 DIAGNOSIS — Z992 Dependence on renal dialysis: Secondary | ICD-10-CM | POA: Diagnosis not present

## 2020-08-13 DIAGNOSIS — Z992 Dependence on renal dialysis: Secondary | ICD-10-CM | POA: Diagnosis not present

## 2020-08-13 DIAGNOSIS — N186 End stage renal disease: Secondary | ICD-10-CM | POA: Diagnosis not present

## 2020-08-16 DIAGNOSIS — N186 End stage renal disease: Secondary | ICD-10-CM | POA: Diagnosis not present

## 2020-08-16 DIAGNOSIS — Z992 Dependence on renal dialysis: Secondary | ICD-10-CM | POA: Diagnosis not present

## 2020-08-18 DIAGNOSIS — Z992 Dependence on renal dialysis: Secondary | ICD-10-CM | POA: Diagnosis not present

## 2020-08-18 DIAGNOSIS — N186 End stage renal disease: Secondary | ICD-10-CM | POA: Diagnosis not present

## 2020-08-23 DIAGNOSIS — Z992 Dependence on renal dialysis: Secondary | ICD-10-CM | POA: Diagnosis not present

## 2020-08-23 DIAGNOSIS — N186 End stage renal disease: Secondary | ICD-10-CM | POA: Diagnosis not present

## 2020-08-25 DIAGNOSIS — Z992 Dependence on renal dialysis: Secondary | ICD-10-CM | POA: Diagnosis not present

## 2020-08-25 DIAGNOSIS — N186 End stage renal disease: Secondary | ICD-10-CM | POA: Diagnosis not present

## 2020-08-27 DIAGNOSIS — N186 End stage renal disease: Secondary | ICD-10-CM | POA: Diagnosis not present

## 2020-08-27 DIAGNOSIS — Z992 Dependence on renal dialysis: Secondary | ICD-10-CM | POA: Diagnosis not present
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
## 2020-08-30 ENCOUNTER — Other Ambulatory Visit: Payer: Self-pay

## 2020-08-30 ENCOUNTER — Ambulatory Visit (INDEPENDENT_AMBULATORY_CARE_PROVIDER_SITE_OTHER): Payer: Medicare HMO | Admitting: Gastroenterology

## 2020-08-30 ENCOUNTER — Encounter: Payer: Self-pay | Admitting: Gastroenterology

## 2020-08-30 VITALS — BP 182/76 | HR 85 | Temp 97.7°F | Ht 70.0 in | Wt 118.8 lb

## 2020-08-30 DIAGNOSIS — K7460 Unspecified cirrhosis of liver: Secondary | ICD-10-CM | POA: Diagnosis not present

## 2020-08-30 DIAGNOSIS — D539 Nutritional anemia, unspecified: Secondary | ICD-10-CM | POA: Diagnosis not present

## 2020-08-30 DIAGNOSIS — N186 End stage renal disease: Secondary | ICD-10-CM | POA: Diagnosis not present

## 2020-08-30 DIAGNOSIS — Z992 Dependence on renal dialysis: Secondary | ICD-10-CM | POA: Diagnosis not present

## 2020-08-30 DIAGNOSIS — K219 Gastro-esophageal reflux disease without esophagitis: Secondary | ICD-10-CM | POA: Insufficient documentation

## 2020-08-30 DIAGNOSIS — Z1211 Encounter for screening for malignant neoplasm of colon: Secondary | ICD-10-CM | POA: Diagnosis not present

## 2020-08-30 NOTE — Patient Instructions (Addendum)
Have labs completed at [facility] [place], [place], [place] [address]  We will get you scheduled for a colonoscopy with Dr. Abbey Chatters in the near future. You will take Linzess 145 mcg x4 days prior to your colon prep to help get your bowels moving.  You will need to be on clear liquids for 2 days prior to your colonoscopy.   For Cirrhosis:  Work towards abstinence of alcohol.   No more than 2000 mg of Tylenol per 24 hours. No more than 2000 mg of sodium in 24 hours.  Consume between 53-80 g of protein daily. Monitor for swelling in your abdomen or lower extremities, significant bruising or bright red blood per rectum or black stools, yellowing of your eyes, or mental status changes and let us know if this occurs.  For heartburn:  Continue taking omeprazole 40 mg daily.  We will plan to see back after your colonoscopy.  Do not hesitate to call if have any questions or concerns prior to your next visit.  Aliene Altes, PA-C [facility]

## 2020-08-31 ENCOUNTER — Telehealth: Payer: Self-pay | Admitting: *Deleted

## 2020-08-31 NOTE — Telephone Encounter (Signed)
Called pt, LMOVM He needs TCS w/ propofol w/ carver, ASA 3

## 2020-09-01 ENCOUNTER — Encounter: Payer: Self-pay | Admitting: *Deleted

## 2020-09-01 MED ORDER — PEG 3350-KCL-NA BICARB-NACL 420 G PO SOLR: ORAL | 0 refills | Status: AC

## 2020-09-01 NOTE — Telephone Encounter (Signed)
PA submitted via [employer]. [facility] Tracking Number: [id number]

## 2020-09-01 NOTE — Telephone Encounter (Signed)
LMTCB. Letter mailed.  

## 2020-09-01 NOTE — Telephone Encounter (Signed)
Patient returned call. He has been scheduled for 3/29 pm appt. Aware will mail covid test appt with his prep instructions. He voiced understanding

## 2020-09-01 NOTE — Addendum Note (Signed)
Addended by: Cheron Every on: 09/01/2020 03:20 PM   Modules accepted: Orders

## 2020-09-02 NOTE — Telephone Encounter (Signed)
PA approved.auth# [id number] DOS 10/05/2020-11/04/2020

## 2020-09-03 ENCOUNTER — Telehealth: Payer: Self-pay

## 2020-09-03 NOTE — Telephone Encounter (Signed)
Samples of Linzess 145 mcg from 06/07/20 left for the pt was put back in cabinet on 09/03/20 (1 box)

## 2020-09-05 ENCOUNTER — Encounter: Payer: Self-pay | Admitting: Gastroenterology

## 2020-09-06 DIAGNOSIS — N186 End stage renal disease: Secondary | ICD-10-CM | POA: Diagnosis not present

## 2020-09-06 DIAGNOSIS — Z992 Dependence on renal dialysis: Secondary | ICD-10-CM | POA: Diagnosis not present

## 2020-09-07 DIAGNOSIS — N186 End stage renal disease: Secondary | ICD-10-CM | POA: Diagnosis not present

## 2020-09-07 DIAGNOSIS — I739 Peripheral vascular disease, unspecified: Secondary | ICD-10-CM | POA: Diagnosis not present

## 2020-09-07 DIAGNOSIS — G629 Polyneuropathy, unspecified: Secondary | ICD-10-CM | POA: Diagnosis not present

## 2020-09-07 DIAGNOSIS — I10 Essential (primary) hypertension: Secondary | ICD-10-CM | POA: Diagnosis not present

## 2020-09-08 DIAGNOSIS — N186 End stage renal disease: Secondary | ICD-10-CM | POA: Diagnosis not present

## 2020-09-08 DIAGNOSIS — Z992 Dependence on renal dialysis: Secondary | ICD-10-CM | POA: Diagnosis not present

## 2020-09-10 DIAGNOSIS — N186 End stage renal disease: Secondary | ICD-10-CM | POA: Diagnosis not present

## 2020-09-10 DIAGNOSIS — Z992 Dependence on renal dialysis: Secondary | ICD-10-CM | POA: Diagnosis not present

## 2020-09-13 DIAGNOSIS — Z992 Dependence on renal dialysis: Secondary | ICD-10-CM | POA: Diagnosis not present

## 2020-09-13 DIAGNOSIS — N186 End stage renal disease: Secondary | ICD-10-CM | POA: Diagnosis not present

## 2020-09-15 DIAGNOSIS — Z992 Dependence on renal dialysis: Secondary | ICD-10-CM | POA: Diagnosis not present

## 2020-09-15 DIAGNOSIS — N186 End stage renal disease: Secondary | ICD-10-CM | POA: Diagnosis not present

## 2020-09-17 ENCOUNTER — Telehealth: Payer: Self-pay | Admitting: *Deleted

## 2020-09-17 ENCOUNTER — Telehealth: Payer: Self-pay | Admitting: Internal Medicine

## 2020-09-17 DIAGNOSIS — N186 End stage renal disease: Secondary | ICD-10-CM | POA: Diagnosis not present

## 2020-09-17 DIAGNOSIS — Z992 Dependence on renal dialysis: Secondary | ICD-10-CM | POA: Diagnosis not present

## 2020-09-17 NOTE — Telephone Encounter (Signed)
Received 2 VM's from patient stating he needs to confirm his appt for procedure. He stated on VM he is going to keep calling until he gets someone because the # he is being transferred too is not answering the phone.  I tried calling patient back x 3 and the phone line is busy.

## 2020-09-17 NOTE — Telephone Encounter (Signed)
Pt needs to speak to nurse regarding his LInzess. [phone]

## 2020-09-17 NOTE — Telephone Encounter (Signed)
Returned the pt's call and it went to his vm. Left vm to return call.

## 2020-09-20 DIAGNOSIS — N186 End stage renal disease: Secondary | ICD-10-CM | POA: Diagnosis not present

## 2020-09-20 DIAGNOSIS — Z992 Dependence on renal dialysis: Secondary | ICD-10-CM | POA: Diagnosis not present

## 2020-09-21 NOTE — Telephone Encounter (Signed)
Pt walked in yesterday and got his questions answered regarding the [place] for his up coming procedure

## 2020-09-24 DIAGNOSIS — N186 End stage renal disease: Secondary | ICD-10-CM | POA: Diagnosis not present

## 2020-09-24 DIAGNOSIS — Z992 Dependence on renal dialysis: Secondary | ICD-10-CM | POA: Diagnosis not present

## 2020-09-27 DIAGNOSIS — Z992 Dependence on renal dialysis: Secondary | ICD-10-CM | POA: Diagnosis not present

## 2020-09-27 DIAGNOSIS — N186 End stage renal disease: Secondary | ICD-10-CM | POA: Diagnosis not present

## 2020-09-28 ENCOUNTER — Telehealth: Payer: Self-pay | Admitting: Internal Medicine

## 2020-09-28 DIAGNOSIS — K7460 Unspecified cirrhosis of liver: Secondary | ICD-10-CM

## 2020-09-28 NOTE — Telephone Encounter (Signed)
Recall mailed 

## 2020-09-28 NOTE — Telephone Encounter (Signed)
RECALL FOR ULTRASOUND 

## 2020-09-29 DIAGNOSIS — Z992 Dependence on renal dialysis: Secondary | ICD-10-CM | POA: Diagnosis not present

## 2020-09-29 DIAGNOSIS — N186 End stage renal disease: Secondary | ICD-10-CM | POA: Diagnosis not present

## 2020-09-30 ENCOUNTER — Encounter (HOSPITAL_COMMUNITY)
Admission: RE | Admit: 2020-09-30 | Discharge: 2020-09-30 | Disposition: A | Discharge status: Home / Self Care | Payer: Medicare HMO | Source: Ambulatory Visit | Attending: Internal Medicine | Admitting: Internal Medicine

## 2020-09-30 ENCOUNTER — Other Ambulatory Visit: Payer: Self-pay

## 2020-09-30 NOTE — Pre-Procedure Instructions (Signed)
   DB     1        Detrell G. [place] Male, 60 y.o., May 22, 1961  MRN:  [id number] Phone:  [phone] (H) ...       PCP:  Rosita Fire, MD Primary Cvg:  [employer] Hmo  Next Appt With [facility]-SCREENING 10/01/2020 at 12:30 PM           RE: pletal Received: Today Eloise Harman, DO  Erenest Rasher, PA-C; [place], Antionette Fairy, RN  Okay to proceed thanks        Previous Messages   ----- Message -----  From: Roselyn Reef  Sent: 09/29/2020  6:18 PM EDT  To: Encarnacion Chu, RN, Eloise Harman, DO  Subject: RE: Herminio Heads,   As far as I know, this should be fine; however, I will run it by Dr. Abbey Chatters.   Dr. Abbey Chatters, are you ok with patients continuing Pletal for procedures?   Thanks,  Cyril Mourning   ----- Message -----  From: Encarnacion Chu, RN  Sent: 09/29/2020 10:38 AM EDT  To: Erenest Rasher, PA-C  Subject: Marliss Coots! I was reviewing D'Arcy Basaldua's chart and was just checking to make sure it is okay for him to remain on his pletal for his procedure.

## 2020-10-01 ENCOUNTER — Other Ambulatory Visit (HOSPITAL_COMMUNITY): Payer: Medicare HMO

## 2020-10-01 ENCOUNTER — Other Ambulatory Visit: Payer: Self-pay

## 2020-10-01 ENCOUNTER — Other Ambulatory Visit (HOSPITAL_COMMUNITY)
Admission: RE | Admit: 2020-10-01 | Discharge: 2020-10-01 | Disposition: A | Discharge status: Home / Self Care | Payer: Medicare HMO | Source: Ambulatory Visit | Attending: Internal Medicine | Admitting: Internal Medicine

## 2020-10-01 DIAGNOSIS — Z20822 Contact with and (suspected) exposure to covid-19: Secondary | ICD-10-CM | POA: Insufficient documentation

## 2020-10-01 DIAGNOSIS — Z01812 Encounter for preprocedural laboratory examination: Secondary | ICD-10-CM | POA: Diagnosis not present

## 2020-10-02 LAB — SARS CORONAVIRUS 2 (TAT 6-24 HRS): SARS Coronavirus 2: NEGATIVE

## 2020-10-04 ENCOUNTER — Telehealth: Payer: Self-pay | Admitting: *Deleted

## 2020-10-04 DIAGNOSIS — Z992 Dependence on renal dialysis: Secondary | ICD-10-CM | POA: Diagnosis not present

## 2020-10-04 DIAGNOSIS — N186 End stage renal disease: Secondary | ICD-10-CM | POA: Diagnosis not present

## 2020-10-04 NOTE — Telephone Encounter (Signed)
Received a call from [place] in endo that they have tried to reach patient multiple times and no call back. Patient procedure is scheduled for tomorrow. He needs to call them [phone].  Called pt home # no answer and no VM, called mobile, LMOVM and left hospital # he needs to call

## 2020-10-04 NOTE — Telephone Encounter (Signed)
Pt mom called to schedule [place] for pt. Needs tues/thursday late as possible in morning.   Called CS and [place] scheduled for 5/5 at 11:30am, arrival 11:15am, npo midnight. Called pt mom back and made aware of appt details. She voiced understanding.

## 2020-10-04 NOTE — Addendum Note (Signed)
Addended by: Cheron Every on: 10/04/2020 03:00 PM   Modules accepted: Orders

## 2020-10-04 NOTE — Telephone Encounter (Signed)
Pt mom called and stated she has spoke with endo staff just now

## 2020-10-05 ENCOUNTER — Ambulatory Visit (HOSPITAL_COMMUNITY): Payer: Medicare HMO | Admitting: Anesthesiology

## 2020-10-05 ENCOUNTER — Other Ambulatory Visit: Payer: Self-pay

## 2020-10-05 ENCOUNTER — Encounter (HOSPITAL_COMMUNITY)
Admission: RE | Disposition: A | Discharge status: Home / Self Care | Payer: Self-pay | Source: Home / Self Care | Attending: Internal Medicine

## 2020-10-05 ENCOUNTER — Encounter (HOSPITAL_COMMUNITY): Payer: Self-pay

## 2020-10-05 ENCOUNTER — Ambulatory Visit (HOSPITAL_COMMUNITY)
Admission: RE | Admit: 2020-10-05 | Discharge: 2020-10-05 | Disposition: A | Discharge status: Home / Self Care | Payer: Medicare HMO | Attending: Internal Medicine | Admitting: Internal Medicine

## 2020-10-05 DIAGNOSIS — K5730 Diverticulosis of large intestine without perforation or abscess without bleeding: Secondary | ICD-10-CM | POA: Insufficient documentation

## 2020-10-05 DIAGNOSIS — Z7951 Long term (current) use of inhaled steroids: Secondary | ICD-10-CM | POA: Diagnosis not present

## 2020-10-05 DIAGNOSIS — N186 End stage renal disease: Secondary | ICD-10-CM | POA: Diagnosis not present

## 2020-10-05 DIAGNOSIS — Z800 Family history of malignant neoplasm of digestive organs: Secondary | ICD-10-CM | POA: Insufficient documentation

## 2020-10-05 DIAGNOSIS — J449 Chronic obstructive pulmonary disease, unspecified: Secondary | ICD-10-CM | POA: Diagnosis not present

## 2020-10-05 DIAGNOSIS — F17210 Nicotine dependence, cigarettes, uncomplicated: Secondary | ICD-10-CM | POA: Insufficient documentation

## 2020-10-05 DIAGNOSIS — Z79899 Other long term (current) drug therapy: Secondary | ICD-10-CM | POA: Insufficient documentation

## 2020-10-05 DIAGNOSIS — I120 Hypertensive chronic kidney disease with stage 5 chronic kidney disease or end stage renal disease: Secondary | ICD-10-CM | POA: Diagnosis not present

## 2020-10-05 DIAGNOSIS — Z992 Dependence on renal dialysis: Secondary | ICD-10-CM | POA: Diagnosis not present

## 2020-10-05 DIAGNOSIS — K648 Other hemorrhoids: Secondary | ICD-10-CM | POA: Diagnosis not present

## 2020-10-05 DIAGNOSIS — Z888 Allergy status to other drugs, medicaments and biological substances status: Secondary | ICD-10-CM | POA: Insufficient documentation

## 2020-10-05 DIAGNOSIS — D509 Iron deficiency anemia, unspecified: Secondary | ICD-10-CM | POA: Diagnosis not present

## 2020-10-05 HISTORY — PX: COLONOSCOPY WITH PROPOFOL: SHX5780

## 2020-10-05 LAB — POCT I-STAT, CHEM 8
BUN: 35 mg/dL — ABNORMAL HIGH (ref 6–20)
Calcium, Ion: 0.88 mmol/L — CL (ref 1.15–1.40)
Chloride: 105 mmol/L (ref 98–111)
Creatinine, Ser: 9.9 mg/dL — ABNORMAL HIGH (ref 0.61–1.24)
Glucose, Bld: 90 mg/dL (ref 70–99)
HCT: 37 % — ABNORMAL LOW (ref 39.0–52.0)
Hemoglobin: 12.6 g/dL — ABNORMAL LOW (ref 13.0–17.0)
Potassium: 4.9 mmol/L (ref 3.5–5.1)
Sodium: 136 mmol/L (ref 135–145)
TCO2: 24 mmol/L (ref 22–32)

## 2020-10-05 SURGERY — COLONOSCOPY WITH PROPOFOL
Anesthesia: General

## 2020-10-05 MED ORDER — PHENYLEPHRINE 40 MCG/ML (10ML) SYRINGE FOR IV PUSH (FOR BLOOD PRESSURE SUPPORT)
PREFILLED_SYRINGE | INTRAVENOUS | Status: AC
  Filled 2020-10-05: qty 10

## 2020-10-05 MED ORDER — LACTATED RINGERS IV SOLN: INTRAVENOUS | Status: DC

## 2020-10-05 MED ORDER — PHENYLEPHRINE HCL (PRESSORS) 10 MG/ML IV SOLN
INTRAVENOUS | Status: DC | PRN
  Administered 2020-10-05: 120 ug via INTRAVENOUS
  Administered 2020-10-05 (×2): 80 ug via INTRAVENOUS
  Administered 2020-10-05: 40 ug via INTRAVENOUS

## 2020-10-05 MED ORDER — PROPOFOL 10 MG/ML IV BOLUS
INTRAVENOUS | Status: DC | PRN
  Administered 2020-10-05: 100 mg via INTRAVENOUS
  Administered 2020-10-05: 30 mg via INTRAVENOUS

## 2020-10-05 MED ORDER — EPHEDRINE SULFATE 50 MG/ML IJ SOLN
INTRAMUSCULAR | Status: DC | PRN
  Administered 2020-10-05: 5 mg via INTRAVENOUS
  Administered 2020-10-05: 10 mg via INTRAVENOUS

## 2020-10-05 MED ORDER — EPHEDRINE 5 MG/ML INJ
INTRAVENOUS | Status: AC
  Filled 2020-10-05: qty 10

## 2020-10-05 MED ORDER — STERILE WATER FOR IRRIGATION IR SOLN
Status: DC | PRN
  Administered 2020-10-05: 100 mL

## 2020-10-05 MED ORDER — LIDOCAINE HCL (CARDIAC) PF 100 MG/5ML IV SOSY
PREFILLED_SYRINGE | INTRAVENOUS | Status: DC | PRN
  Administered 2020-10-05: 50 mg via INTRAVENOUS

## 2020-10-05 MED ORDER — SODIUM CHLORIDE 0.9 % IV SOLN: INTRAVENOUS | Status: DC

## 2020-10-05 NOTE — Anesthesia Postprocedure Evaluation (Signed)
Anesthesia Post Note  Patient: Luke Mueller  Procedure(s) Performed: COLONOSCOPY WITH PROPOFOL (N/A )  Patient location during evaluation: Phase II Anesthesia Type: General Level of consciousness: awake and alert and oriented Pain management: pain level controlled Vital Signs Assessment: post-procedure vital signs reviewed and stable Respiratory status: spontaneous breathing and respiratory function stable Cardiovascular status: stable Postop Assessment: no apparent nausea or vomiting Anesthetic complications: no   No complications documented.   Last Vitals:  Vitals:   10/05/20 1057 10/05/20 1149  BP: (!) 181/76 (!) 189/80  Pulse: 73 74  Resp: 14 18  Temp: 36.6 C 36.6 C  SpO2: 97% 100%    Last Pain:  Vitals:   10/05/20 1149  TempSrc:   PainSc: 0-No pain                 Lonetta Blassingame C Emrys [place]ck[place]
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
## 2020-10-05 NOTE — Anesthesia Procedure Notes (Signed)
Date/Time: 10/05/2020 11:32 AM Performed by: Orlie Dakin, CRNA Pre-anesthesia Checklist: Patient identified, Emergency Drugs available, Suction available and Patient being monitored Patient Re-evaluated:Patient Re-evaluated prior to induction Oxygen Delivery Method: Nasal cannula Induction Type: IV induction Placement Confirmation: positive ETCO2

## 2020-10-05 NOTE — Discharge Instructions (Signed)
  Colonoscopy Discharge Instructions  Read the instructions outlined below and refer to this sheet in the next few weeks. These discharge instructions provide you with general information on caring for yourself after you leave the hospital. Your doctor may also give you specific instructions. While your treatment has been planned according to the most current medical practices available, unavoidable complications occasionally occur.   ACTIVITY  You may resume your regular activity, but move at a slower pace for the next 24 hours.   Take frequent rest periods for the next 24 hours.   Walking will help get rid of the air and reduce the bloated feeling in your belly (abdomen).   No driving for 24 hours (because of the medicine (anesthesia) used during the test).    Do not sign any important legal documents or operate any machinery for 24 hours (because of the anesthesia used during the test).  NUTRITION  Drink plenty of fluids.   You may resume your normal diet as instructed by your doctor.   Begin with a light meal and progress to your normal diet. Heavy or fried foods are harder to digest and may make you feel sick to your stomach (nauseated).   Avoid alcoholic beverages for 24 hours or as instructed.  MEDICATIONS  You may resume your normal medications unless your doctor tells you otherwise.  WHAT YOU CAN EXPECT TODAY  Some feelings of bloating in the abdomen.   Passage of more gas than usual.   Spotting of blood in your stool or on the toilet paper.  IF YOU HAD POLYPS REMOVED DURING THE COLONOSCOPY:  No aspirin products for 7 days or as instructed.   No alcohol for 7 days or as instructed.   Eat a soft diet for the next 24 hours.  FINDING OUT THE RESULTS OF YOUR TEST Not all test results are available during your visit. If your test results are not back during the visit, make an appointment with your caregiver to find out the results. Do not assume everything is normal if  you have not heard from your caregiver or the medical facility. It is important for you to follow up on all of your test results.  SEEK IMMEDIATE MEDICAL ATTENTION IF:  You have more than a spotting of blood in your stool.   Your belly is swollen (abdominal distention).   You are nauseated or vomiting.   You have a temperature over 101.   You have abdominal pain or discomfort that is severe or gets worse throughout the day.   Your colonoscopy was relatively unremarkable.  I did not find any polyps or evidence of colon cancer. You do have diverticulosis and internal hemorrhoids. I would recommend increasing fiber in your diet or adding OTC Benefiber/Metamucil.  Follow-up with GI in 3 months or sooner if needed.  Repeat colonoscopy in 10 years for screening purposes.  I hope you have a great rest of your week!  Elon Alas. Abbey Chatters, D.O. Gastroenterology and Hepatology [facility]

## 2020-10-05 NOTE — Transfer of Care (Signed)
Immediate Anesthesia Transfer of Care Note  Patient: Luke Mueller  Procedure(s) Performed: COLONOSCOPY WITH PROPOFOL (N/A )  Patient Location: Short Stay  Anesthesia Type:General  Level of Consciousness: awake, alert  and oriented  Airway & Oxygen Therapy: Patient Spontanous Breathing  Post-op Assessment: Report given to RN, Post -op Vital signs reviewed and stable and Patient moving all extremities X 4  Post vital signs: Reviewed and stable  Last Vitals:  Vitals Value Taken Time  BP    Temp    Pulse    Resp    SpO2      Last Pain:  Vitals:   10/05/20 1125  TempSrc:   PainSc: 0-No pain         Complications: No complications documented.

## 2020-10-05 NOTE — Op Note (Signed)
[facility] Patient Name: Luke Mueller Procedure Date: 10/05/2020 11:04 AM MRN: [id number] Date of Birth: 1961/05/29 Attending MD: Elon Alas. Abbey Chatters DO CSN: [id number] Age: 60 Admit Type: Outpatient Procedure:                Colonoscopy Indications:              Iron deficiency anemia Providers:                Elon Alas. Abbey Chatters, DO, Janeece Riggers, RN, Aram Candela Referring MD:              Medicines:                See the Anesthesia note for documentation of the                            administered medications Complications:            No immediate complications. Estimated Blood Loss:     Estimated blood loss: none. Procedure:                Pre-Anesthesia Assessment:                           - The anesthesia plan was to use monitored                            anesthesia care (MAC).                           After obtaining informed consent, the colonoscope                            was passed under direct vision. Throughout the                            procedure, the patient's blood pressure, pulse, and                            oxygen saturations were monitored continuously. The                            PCF-H190DL ([id number]) scope was introduced through                            the anus and advanced to the the cecum, identified                            by appendiceal orifice and ileocecal valve. The                            colonoscopy was performed without difficulty. The                            patient tolerated the procedure well. The quality                            of the bowel  preparation was evaluated using the                            BBPS [facility] Bowel Preparation Scale) with scores                            of: Right Colon = 2 (minor amount of residual                            staining, small fragments of stool and/or opaque                            liquid, but mucosa seen well), Transverse Colon = 3                            (entire  mucosa seen well with no residual staining,                            small fragments of stool or opaque liquid) and Left                            Colon = 3 (entire mucosa seen well with no residual                            staining, small fragments of stool or opaque                            liquid). The total BBPS score equals 8. The quality                            of the bowel preparation was good. Scope In: 11:29:13 AM Scope Out: 11:40:46 AM Scope Withdrawal Time: 0 hours 6 minutes 6 seconds  Total Procedure Duration: 0 hours 11 minutes 33 seconds  Findings:      The perianal and digital rectal examinations were normal.      Non-bleeding internal hemorrhoids were found during endoscopy.      Multiple small-mouthed diverticula were found in the sigmoid colon.      The exam was otherwise without abnormality. Impression:               - Non-bleeding internal hemorrhoids.                           - Diverticulosis in the sigmoid colon.                           - The examination was otherwise normal.                           - No specimens collected. Moderate Sedation:      Per Anesthesia Care Recommendation:           - Patient has a contact number available for  emergencies. The signs and symptoms of potential                            delayed complications were discussed with the                            patient. Return to normal activities tomorrow.                            Written discharge instructions were provided to the                            patient.                           - Resume previous diet.                           - Continue present medications.                           - Repeat colonoscopy in 10 years for screening                            purposes.                           - Return to GI clinic in 3 months. Consider capsule                            endoscopy for completeness. Procedure Code(s):        ---  Professional ---                           [phone], Colonoscopy, flexible; diagnostic, including                            collection of specimen(s) by brushing or washing,                            when performed (separate procedure) Diagnosis Code(s):        --- Professional ---                           K64.8, Other hemorrhoids                           D50.9, Iron deficiency anemia, unspecified                           K57.30, Diverticulosis of large intestine without                            perforation or abscess without bleeding CPT copyright 2019 American Medical Association. All rights reserved. The codes documented in this report are preliminary and upon coder review may  be revised to meet current compliance requirements. Elon Alas.  Abbey Chatters, DO [place] Abbey Chatters, DO 10/05/2020 11:44:24 AM This report has been signed electronically. Number of Addenda: 0

## 2020-10-06 DIAGNOSIS — Z992 Dependence on renal dialysis: Secondary | ICD-10-CM | POA: Diagnosis not present

## 2020-10-06 DIAGNOSIS — N186 End stage renal disease: Secondary | ICD-10-CM | POA: Diagnosis not present

## 2020-10-07 DIAGNOSIS — Z992 Dependence on renal dialysis: Secondary | ICD-10-CM | POA: Diagnosis not present

## 2020-10-07 DIAGNOSIS — N186 End stage renal disease: Secondary | ICD-10-CM | POA: Diagnosis not present

## 2020-10-08 DIAGNOSIS — N186 End stage renal disease: Secondary | ICD-10-CM | POA: Diagnosis not present

## 2020-10-08 DIAGNOSIS — Z992 Dependence on renal dialysis: Secondary | ICD-10-CM | POA: Diagnosis not present

## 2020-10-11 ENCOUNTER — Encounter (HOSPITAL_COMMUNITY): Payer: Self-pay | Admitting: Internal Medicine

## 2020-10-11 DIAGNOSIS — Z992 Dependence on renal dialysis: Secondary | ICD-10-CM | POA: Diagnosis not present

## 2020-10-11 DIAGNOSIS — N186 End stage renal disease: Secondary | ICD-10-CM | POA: Diagnosis not present

## 2020-10-13 DIAGNOSIS — Z992 Dependence on renal dialysis: Secondary | ICD-10-CM | POA: Diagnosis not present

## 2020-10-13 DIAGNOSIS — N186 End stage renal disease: Secondary | ICD-10-CM | POA: Diagnosis not present

## 2020-10-15 DIAGNOSIS — N186 End stage renal disease: Secondary | ICD-10-CM | POA: Diagnosis not present

## 2020-10-15 DIAGNOSIS — Z992 Dependence on renal dialysis: Secondary | ICD-10-CM | POA: Diagnosis not present

## 2020-10-18 DIAGNOSIS — N186 End stage renal disease: Secondary | ICD-10-CM | POA: Diagnosis not present

## 2020-10-18 DIAGNOSIS — Z992 Dependence on renal dialysis: Secondary | ICD-10-CM | POA: Diagnosis not present

## 2020-10-22 DIAGNOSIS — Z992 Dependence on renal dialysis: Secondary | ICD-10-CM | POA: Diagnosis not present

## 2020-10-22 DIAGNOSIS — N186 End stage renal disease: Secondary | ICD-10-CM | POA: Diagnosis not present

## 2020-10-25 DIAGNOSIS — Z992 Dependence on renal dialysis: Secondary | ICD-10-CM | POA: Diagnosis not present

## 2020-10-25 DIAGNOSIS — N186 End stage renal disease: Secondary | ICD-10-CM | POA: Diagnosis not present

## 2020-11-01 DIAGNOSIS — N186 End stage renal disease: Secondary | ICD-10-CM | POA: Diagnosis not present

## 2020-11-01 DIAGNOSIS — Z992 Dependence on renal dialysis: Secondary | ICD-10-CM | POA: Diagnosis not present

## 2020-11-03 DIAGNOSIS — Z992 Dependence on renal dialysis: Secondary | ICD-10-CM | POA: Diagnosis not present

## 2020-11-03 DIAGNOSIS — N186 End stage renal disease: Secondary | ICD-10-CM | POA: Diagnosis not present

## 2020-11-05 DIAGNOSIS — Z992 Dependence on renal dialysis: Secondary | ICD-10-CM | POA: Diagnosis not present

## 2020-11-05 DIAGNOSIS — N186 End stage renal disease: Secondary | ICD-10-CM | POA: Diagnosis not present

## 2020-11-06 DIAGNOSIS — N186 End stage renal disease: Secondary | ICD-10-CM | POA: Diagnosis not present

## 2020-11-06 DIAGNOSIS — Z992 Dependence on renal dialysis: Secondary | ICD-10-CM | POA: Diagnosis not present

## 2020-11-08 DIAGNOSIS — Z992 Dependence on renal dialysis: Secondary | ICD-10-CM | POA: Diagnosis not present

## 2020-11-08 DIAGNOSIS — N186 End stage renal disease: Secondary | ICD-10-CM | POA: Diagnosis not present

## 2020-11-10 DIAGNOSIS — N186 End stage renal disease: Secondary | ICD-10-CM | POA: Diagnosis not present

## 2020-11-10 DIAGNOSIS — Z992 Dependence on renal dialysis: Secondary | ICD-10-CM | POA: Diagnosis not present

## 2020-11-11 ENCOUNTER — Ambulatory Visit (HOSPITAL_COMMUNITY): Admission: RE | Admit: 2020-11-11 | Payer: Medicare HMO | Source: Ambulatory Visit

## 2020-11-16 ENCOUNTER — Other Ambulatory Visit (HOSPITAL_COMMUNITY): Payer: Self-pay | Admitting: *Deleted

## 2020-11-16 ENCOUNTER — Other Ambulatory Visit: Payer: Self-pay

## 2020-11-16 ENCOUNTER — Inpatient Hospital Stay (HOSPITAL_COMMUNITY): Payer: Medicare HMO

## 2020-11-16 ENCOUNTER — Inpatient Hospital Stay (HOSPITAL_COMMUNITY)
Admission: EM | Admit: 2020-11-16 | Discharge: 2020-11-16 | DRG: 640 | Discharge status: Against Medical Advice | Payer: Medicare HMO | Attending: Internal Medicine | Admitting: Internal Medicine

## 2020-11-16 ENCOUNTER — Encounter (HOSPITAL_COMMUNITY): Payer: Self-pay | Admitting: Emergency Medicine

## 2020-11-16 ENCOUNTER — Emergency Department (HOSPITAL_COMMUNITY): Payer: Medicare HMO

## 2020-11-16 DIAGNOSIS — R0602 Shortness of breath: Secondary | ICD-10-CM

## 2020-11-16 DIAGNOSIS — I120 Hypertensive chronic kidney disease with stage 5 chronic kidney disease or end stage renal disease: Secondary | ICD-10-CM | POA: Diagnosis not present

## 2020-11-16 DIAGNOSIS — R9431 Abnormal electrocardiogram [ECG] [EKG]: Secondary | ICD-10-CM | POA: Diagnosis not present

## 2020-11-16 DIAGNOSIS — F17210 Nicotine dependence, cigarettes, uncomplicated: Secondary | ICD-10-CM

## 2020-11-16 DIAGNOSIS — Z681 Body mass index (BMI) 19 or less, adult: Secondary | ICD-10-CM

## 2020-11-16 DIAGNOSIS — N19 Unspecified kidney failure: Secondary | ICD-10-CM

## 2020-11-16 DIAGNOSIS — J449 Chronic obstructive pulmonary disease, unspecified: Secondary | ICD-10-CM | POA: Diagnosis not present

## 2020-11-16 DIAGNOSIS — K219 Gastro-esophageal reflux disease without esophagitis: Secondary | ICD-10-CM | POA: Diagnosis present

## 2020-11-16 DIAGNOSIS — J439 Emphysema, unspecified: Secondary | ICD-10-CM | POA: Diagnosis not present

## 2020-11-16 DIAGNOSIS — E8770 Fluid overload, unspecified: Secondary | ICD-10-CM | POA: Diagnosis not present

## 2020-11-16 DIAGNOSIS — Z888 Allergy status to other drugs, medicaments and biological substances status: Secondary | ICD-10-CM

## 2020-11-16 DIAGNOSIS — Z7951 Long term (current) use of inhaled steroids: Secondary | ICD-10-CM

## 2020-11-16 DIAGNOSIS — E872 Acidosis: Secondary | ICD-10-CM | POA: Diagnosis present

## 2020-11-16 DIAGNOSIS — E441 Mild protein-calorie malnutrition: Secondary | ICD-10-CM | POA: Diagnosis present

## 2020-11-16 DIAGNOSIS — M79604 Pain in right leg: Secondary | ICD-10-CM | POA: Diagnosis not present

## 2020-11-16 DIAGNOSIS — R21 Rash and other nonspecific skin eruption: Secondary | ICD-10-CM | POA: Diagnosis not present

## 2020-11-16 DIAGNOSIS — E8809 Other disorders of plasma-protein metabolism, not elsewhere classified: Secondary | ICD-10-CM | POA: Diagnosis present

## 2020-11-16 DIAGNOSIS — I248 Other forms of acute ischemic heart disease: Secondary | ICD-10-CM | POA: Diagnosis not present

## 2020-11-16 DIAGNOSIS — I10 Essential (primary) hypertension: Secondary | ICD-10-CM

## 2020-11-16 DIAGNOSIS — Z992 Dependence on renal dialysis: Secondary | ICD-10-CM | POA: Diagnosis not present

## 2020-11-16 DIAGNOSIS — Z825 Family history of asthma and other chronic lower respiratory diseases: Secondary | ICD-10-CM

## 2020-11-16 DIAGNOSIS — Z79899 Other long term (current) drug therapy: Secondary | ICD-10-CM

## 2020-11-16 DIAGNOSIS — Z20822 Contact with and (suspected) exposure to covid-19: Secondary | ICD-10-CM | POA: Diagnosis present

## 2020-11-16 DIAGNOSIS — Z8249 Family history of ischemic heart disease and other diseases of the circulatory system: Secondary | ICD-10-CM

## 2020-11-16 DIAGNOSIS — N250 Renal osteodystrophy: Secondary | ICD-10-CM | POA: Diagnosis not present

## 2020-11-16 DIAGNOSIS — I160 Hypertensive urgency: Secondary | ICD-10-CM | POA: Diagnosis present

## 2020-11-16 DIAGNOSIS — D631 Anemia in chronic kidney disease: Secondary | ICD-10-CM | POA: Diagnosis not present

## 2020-11-16 DIAGNOSIS — J810 Acute pulmonary edema: Secondary | ICD-10-CM | POA: Diagnosis not present

## 2020-11-16 DIAGNOSIS — I517 Cardiomegaly: Secondary | ICD-10-CM | POA: Diagnosis not present

## 2020-11-16 DIAGNOSIS — N186 End stage renal disease: Secondary | ICD-10-CM | POA: Diagnosis not present

## 2020-11-16 DIAGNOSIS — J9601 Acute respiratory failure with hypoxia: Secondary | ICD-10-CM | POA: Diagnosis not present

## 2020-11-16 DIAGNOSIS — K7460 Unspecified cirrhosis of liver: Secondary | ICD-10-CM | POA: Diagnosis present

## 2020-11-16 DIAGNOSIS — R52 Pain, unspecified: Secondary | ICD-10-CM

## 2020-11-16 DIAGNOSIS — I739 Peripheral vascular disease, unspecified: Secondary | ICD-10-CM | POA: Diagnosis present

## 2020-11-16 DIAGNOSIS — J90 Pleural effusion, not elsewhere classified: Secondary | ICD-10-CM | POA: Diagnosis not present

## 2020-11-16 DIAGNOSIS — R7989 Other specified abnormal findings of blood chemistry: Secondary | ICD-10-CM | POA: Diagnosis not present

## 2020-11-16 DIAGNOSIS — I509 Heart failure, unspecified: Secondary | ICD-10-CM | POA: Insufficient documentation

## 2020-11-16 LAB — COMPREHENSIVE METABOLIC PANEL
ALT: 9 U/L (ref 0–44)
AST: 24 U/L (ref 15–41)
Albumin: 3.4 g/dL — ABNORMAL LOW (ref 3.5–5.0)
Alkaline Phosphatase: 84 U/L (ref 38–126)
Anion gap: 13 (ref 5–15)
BUN: 78 mg/dL — ABNORMAL HIGH (ref 6–20)
CO2: 19 mmol/L — ABNORMAL LOW (ref 22–32)
Calcium: 8.1 mg/dL — ABNORMAL LOW (ref 8.9–10.3)
Chloride: 104 mmol/L (ref 98–111)
Creatinine, Ser: 15.7 mg/dL — ABNORMAL HIGH (ref 0.61–1.24)
GFR, Estimated: 3 mL/min — ABNORMAL LOW (ref >60)
Glucose, Bld: 101 mg/dL — ABNORMAL HIGH (ref 70–99)
Potassium: 4.7 mmol/L (ref 3.5–5.1)
Sodium: 136 mmol/L (ref 135–145)
Total Bilirubin: 0.9 mg/dL (ref 0.3–1.2)
Total Protein: 7.1 g/dL (ref 6.5–8.1)

## 2020-11-16 LAB — CBC
HCT: 37.7 % — ABNORMAL LOW (ref 39.0–52.0)
Hemoglobin: 12.2 g/dL — ABNORMAL LOW (ref 13.0–17.0)
MCH: 32.2 pg (ref 26.0–34.0)
MCHC: 32.4 g/dL (ref 30.0–36.0)
MCV: 99.5 fL (ref 80.0–100.0)
Platelets: 200 10*3/uL (ref 150–400)
RBC: 3.79 MIL/uL — ABNORMAL LOW (ref 4.22–5.81)
RDW: 13.7 % (ref 11.5–15.5)
WBC: 8.1 10*3/uL (ref 4.0–10.5)
nRBC: 0 % (ref 0.0–0.2)

## 2020-11-16 LAB — TROPONIN I (HIGH SENSITIVITY)
Troponin I (High Sensitivity): 29 ng/L — ABNORMAL HIGH (ref <18)
Troponin I (High Sensitivity): 26 ng/L — ABNORMAL HIGH (ref <18)

## 2020-11-16 LAB — PHOSPHORUS: Phosphorus: 7.3 mg/dL — ABNORMAL HIGH (ref 2.5–4.6)

## 2020-11-16 LAB — RESP PANEL BY RT-PCR (FLU A&B, COVID) ARPGX2
Influenza A by PCR: NEGATIVE
Influenza B by PCR: NEGATIVE
SARS Coronavirus 2 by RT PCR: NEGATIVE

## 2020-11-16 LAB — MAGNESIUM: Magnesium: 2.6 mg/dL — ABNORMAL HIGH (ref 1.7–2.4)

## 2020-11-16 LAB — HIV ANTIBODY (ROUTINE TESTING W REFLEX): HIV Screen 4th Generation wRfx: NONREACTIVE

## 2020-11-16 LAB — BRAIN NATRIURETIC PEPTIDE: B Natriuretic Peptide: >4500 pg/mL — ABNORMAL HIGH (ref 0.0–100.0)

## 2020-11-16 MED ORDER — MOMETASONE FURO-FORMOTEROL FUM 200-5 MCG/ACT IN AERO
2.0000 | INHALATION_SPRAY | Freq: Two times a day (BID) | RESPIRATORY_TRACT | Status: DC
  Administered 2020-11-16: 2 via RESPIRATORY_TRACT
  Filled 2020-11-16: qty 8.8

## 2020-11-16 MED ORDER — HEPARIN SODIUM (PORCINE) 5000 UNIT/ML IJ SOLN
5000.0000 [IU] | Freq: Three times a day (TID) | INTRAMUSCULAR | Status: DC
  Administered 2020-11-16: 5000 [IU] via SUBCUTANEOUS
  Filled 2020-11-16: qty 1

## 2020-11-16 MED ORDER — CHLORHEXIDINE GLUCONATE CLOTH 2 % EX PADS
6.0000 | MEDICATED_PAD | Freq: Every day | CUTANEOUS | Status: DC
  Administered 2020-11-16: 6 via TOPICAL

## 2020-11-16 MED ORDER — RENA-VITE PO TABS
1.0000 | ORAL_TABLET | Freq: Every day | ORAL | Status: DC
  Administered 2020-11-16: 1 via ORAL
  Filled 2020-11-16: qty 1

## 2020-11-16 MED ORDER — SEVELAMER CARBONATE 800 MG PO TABS: 800.0000 mg | ORAL_TABLET | ORAL | Status: DC

## 2020-11-16 MED ORDER — HYDRALAZINE HCL 25 MG PO TABS
100.0000 mg | ORAL_TABLET | Freq: Three times a day (TID) | ORAL | Status: DC
  Filled 2020-11-16: qty 4

## 2020-11-16 MED ORDER — PANTOPRAZOLE SODIUM 40 MG PO TBEC
40.0000 mg | DELAYED_RELEASE_TABLET | Freq: Every day | ORAL | Status: DC
  Administered 2020-11-16: 40 mg via ORAL
  Filled 2020-11-16: qty 1

## 2020-11-16 MED ORDER — SEVELAMER CARBONATE 800 MG PO TABS
800.0000 mg | ORAL_TABLET | Freq: Three times a day (TID) | ORAL | Status: DC
  Administered 2020-11-16: 800 mg via ORAL
  Filled 2020-11-16 (×2): qty 1

## 2020-11-16 MED ORDER — ENSURE ENLIVE PO LIQD: 237.0000 mL | Freq: Two times a day (BID) | ORAL | Status: DC

## 2020-11-16 MED ORDER — NEPRO/CARBSTEADY PO LIQD
237.0000 mL | Freq: Two times a day (BID) | ORAL | Status: DC
  Administered 2020-11-16: 237 mL via ORAL

## 2020-11-16 MED ORDER — ALBUTEROL SULFATE HFA 108 (90 BASE) MCG/ACT IN AERS: 2.0000 | INHALATION_SPRAY | Freq: Four times a day (QID) | RESPIRATORY_TRACT | Status: DC | PRN

## 2020-11-16 MED ORDER — CARVEDILOL 12.5 MG PO TABS
25.0000 mg | ORAL_TABLET | Freq: Two times a day (BID) | ORAL | Status: DC
  Filled 2020-11-16: qty 2

## 2020-11-16 MED ORDER — CALCIUM ACETATE (PHOS BINDER) 667 MG PO CAPS
667.0000 mg | ORAL_CAPSULE | Freq: Every day | ORAL | Status: DC
  Administered 2020-11-16: 667 mg via ORAL
  Filled 2020-11-16: qty 1

## 2020-11-16 NOTE — Progress Notes (Signed)
Care started after midnight in the emergency room and patient was admitted early this morning after midnight by Dr. Bernadette Hoit I am in current agreement with his assessment and plan.  Additional changes to the plan of care been made accordingly.  The patient is a 60 year old African-American male with a past medical history significant for but not limited to COPD, hypertension, PVD, GERD, ESRD on hemodialysis on Monday Wednesday Friday, history of cirrhosis, history of irregular heartbeat, hypertension and other comorbidities who presented with a chief complaint of shortness of breath which progressively worsened over the weekend.  Patient states his last dialysis was on Wednesday, 11/10/2020 and that he received a call from the dialysis center not to come in Friday and that he would be seen on Wednesday due to being short staffed.  He complained of being worse shortness of breath yesterday and worse with ambulation and laying flat.  Because of his respiratory symptoms he was brought to the ED and had a initial blood pressure 204/93.  Patient also states that he fell recently and hurt his leg and that he has been off balance.  Work-up in the ED showed a normocytic anemia likely of chronic kidney disease but a BUN and creatinine that was significant elevated at 78/15.70.  BNP was also elevated being greater than 4500.  Chest x-ray showed scattered interstitial nodularity that may be chronic or exact present atypical infection.  Hospitalist team was consulted for further evaluation and admission.  Currently he is being treated and worked up for shortness of breath likely secondary to volume overload with multiple missed dialysis sessions.  Nephrology has been consulted for further evaluation and plan to take the patient dialysis today.  His hypertensive urgency has improved and will continue his Coreg and hydralazine.  We will avoid QT prolonging agents given his prolonged QTC.  His elevated BNP is in the setting  of his volume overload and will need to rule out CHF exacerbation given that he does have diastolic dysfunction.  Repeat echocardiogram is to be done.  His elevated troponin is likely due to demand ischemia in the setting of his volume overload from missed dialysis.  Given that he does have some right leg pain we will get a DG tib-fib x-ray to further evaluate given that he did fall and hurt it.  We will continue to monitor the patient's clinical response to intervention and repeat blood work in the a.m. as well as an x-ray and follow-up on nephrology recommendations.  I spoke with Dr. Marval Regal and the plan is for the patient to be dialyzed today.
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
## 2020-11-16 NOTE — Progress Notes (Signed)
Upon returning to the unit from dialysis, pt states if the MD will not discharge him he will be signing out Luke Mueller. Dr. Alfredia Ferguson made aware. AMA paperwork prepared, signed, and witnessed. Peripheral IV access removed.
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
## 2020-11-16 NOTE — Discharge Summary (Signed)
Physician Discharge Summary  Luke Mueller [id number] DOB: 1961-04-15 DOA: 11/16/2020  PCP: Rosita Fire, MD  Admit date: 11/16/2020 Discharge date: 11/16/2020  Admitted From: Home Disposition: Left AMA  Recommendations for Outpatient Follow-up:  1. Follow up with PCP in 1-2 weeks 2. Follow up with Nephrology within 1-2 weeks  3. Please obtain CMP/CBC, Mag, Phos in one week 4. Please follow up on the following pending results:  Home Health: No Equipment/Devices: None    Discharge Condition: Guarded CODE STATUS: FULL CODE Diet recommendation: Renal Carb Diet with 1200 mL Fluid Restriction  Brief/Interim Summary: The patient is a 60 year old African-American male with a past medical history significant for but not limited to COPD, hypertension, PVD, GERD, ESRD on hemodialysis on Monday Wednesday Friday, history of cirrhosis, history of irregular heartbeat, hypertension and other comorbidities who presented with a chief complaint of shortness of breath which progressively worsened over the weekend.  Patient states his last dialysis was on Wednesday, 11/10/2020 and that he received a call from the dialysis center not to come in Friday and that he would be seen on Wednesday due to being short staffed.  He complained of being worse shortness of breath yesterday and worse with ambulation and laying flat.  Because of his respiratory symptoms he was brought to the ED and had a initial blood pressure 204/93.  Patient also states that he fell recently and hurt his leg and that he has been off balance.  Work-up in the ED showed a normocytic anemia likely of chronic kidney disease but a BUN and creatinine that was significant elevated at 78/15.70.  BNP was also elevated being greater than 4500.  Chest x-ray showed scattered interstitial nodularity that may be chronic or exact present atypical infection.  Hospitalist team was consulted for further evaluation and admission.  Currently he is being treated  and worked up for shortness of breath likely secondary to volume overload with multiple missed dialysis sessions.  Nephrology has been consulted for further evaluation and plan to take the patient dialysis today.  His hypertensive urgency has improved and will continue his Coreg and hydralazine.  We will avoid QT prolonging agents given his prolonged QTC.  His elevated BNP is in the setting of his volume overload and will need to rule out CHF exacerbation given that he does have diastolic dysfunction.  Repeat echocardiogram is to be done.  His elevated troponin is likely due to demand ischemia in the setting of his volume overload from missed dialysis.  Given that he does have some right leg pain we will get a DG tib-fib x-ray to further evaluate given that he did fall and hurt it.  Subsequently after dialysis patient no longer wanted to be hospitalized and signed himself [name].  He is of sound mind when he made this decision and he understands risks of further decompensation, worsening anemia and possible death and understands and accepts these risks on his own volition.  He was not  deemed stable to be discharged as nephrology wanted to dialyze him again in the morning.  Discharge Diagnoses:  Principal Problem:   Shortness of breath Active Problems:   COPD  GOLD 2/ active smoker AB hx    End stage renal disease (HCC)   Cigarette smoker   Essential hypertension   Hypoalbuminemia   Prolonged QT interval   Hypertensive urgency   Elevated brain natriuretic peptide (BNP) level  Shortness of breath in the setting of volume overload in the setting of missed  dialysis session with concern for CHF exacerbation -He has missed 2 dialysis sessions and complained of shortness of breath -BNP was elevated to 4500 -Nephrology was consulted for hemodialysis and he underwent hemodialysis today and the plan for for him to get hemodialysis again with ultrafiltration in the morning but he signed  himself out [name] -Continued with PhosLo and Renvela  ESRD with hemodialysis Monday Wednesday Friday Metabolic acidosis Hyperphosphatemia -Nephrology consulted and appreciate dialysis -Patient's BUNs/creatinine was 78/15.70 -She had a small metabolic acidosis with a CO2 of 19, anion gap of 13 and a chloride level 104 -Patient's phosphorus level was 7.3 -1H2 repeat in the a.m. the patient signed out [name]  Hypertensive urgency Uncontrolled hypertension -Blood pressure was elevated prior to discharge and we continued his Coreg and hydralazine and he was dialyzed today additional prescription dialysis tomorrow -Subsequently the patient signed out [name] -Last Blood pressure recorded prior to signing out [name] was 180/90  Prolonged QTC -Avoid QT prolonging medications  Elevated BNP rule out CHF exacerbation -BNP was elevated at 4500 and this is likely secondary to fluid overload in setting of ESRD -Obtain strict I's and O's and daily weights and fluid restriction -Was ordered repeat echocardiogram as he does have some grade 1 diastolic dysfunction however he left AGAINST MEDICAL ADVICE prior to this being done  Elevated troponin in the setting of type II demand ischemia -Patient's troponin went from 29 is down to 26 and is flat -he denies any chest pain but was short of breath  COPD/tobacco abuse  -COPD not in exacerbation and wanted to continue his inhalers with Ventolin and Dulera -Patient signed out AGAINST MEDICAL ADVICE  Anemia of chronic kidney disease -This was in the setting of ESRD -Hemoglobin/hematocrit was stable at 12.2/37.7 -Hemoglobin was at baseline and wanted to repeat in the morning however he signed out [name]  Hypoalbuminemia -Likely secondary to mild protein calorie malnutrition and underweight -He was given Nepro supplements however he signed out against medical  Discharge  Instructions  Discharge Instructions    Diet - low sodium heart healthy   Complete by: As directed    Increase activity slowly   Complete by: As directed      Allergies as of 11/16/2020      Reactions   Lotrel [amlodipine Besy-benazepril Hcl] Swelling   Lips swelling      Medication List    TAKE these medications   acetaminophen 325 MG tablet Commonly known as: TYLENOL Take 2 tablets (650 mg total) by mouth every 6 (six) hours as needed for mild pain, fever or headache (or Fever >/= 101).   albuterol 108 (90 Base) MCG/ACT inhaler Commonly known as: VENTOLIN HFA Inhale 2 puffs into the lungs every 6 (six) hours as needed for wheezing or shortness of breath.   amLODipine 10 MG tablet Commonly known as: NORVASC Take 1 tablet (10 mg total) by mouth daily.   budesonide-formoterol 160-4.5 MCG/ACT inhaler Commonly known as: SYMBICORT Inhale 2 puffs into the lungs 2 (two) times daily.   calcium acetate 667 MG capsule Commonly known as: PHOSLO Take 1 capsule (667 mg total) by mouth daily with breakfast.   carvedilol 25 MG tablet Commonly known as: COREG Take 1 tablet (25 mg total) by mouth 2 (two) times daily.   cilostazol 100 MG tablet Commonly known as: PLETAL Take 1 tablet (100 mg total) by mouth 2 (two) times daily.   hydrALAZINE 100 MG tablet Commonly known  as: APRESOLINE Take 1 tablet (100 mg total) by mouth 3 (three) times daily.   loperamide 2 MG tablet Commonly known as: Imodium A-D Take 1 tablet (2 mg total) by mouth 4 (four) times daily as needed for diarrhea or loose stools.   multivitamin Tabs tablet Take 1 tablet by mouth daily.   omeprazole 40 MG capsule Commonly known as: PRILOSEC Take 30- 60 min before your first and last meals of the day What changed: additional instructions   ondansetron 4 MG disintegrating tablet Commonly known as: Zofran ODT Take 1 tablet (4 mg total) by mouth every 8 (eight) hours as needed for nausea or vomiting.    polyethylene glycol-electrolytes 420 g solution Commonly known as: NuLYTELY As directed   Restasis 0.05 % ophthalmic emulsion Generic drug: cycloSPORINE Place 1 drop into both eyes 2 (two) times daily.   sevelamer carbonate 800 MG tablet Commonly known as: RENVELA 3 tablet each meal and 1 tablet with snack   torsemide 100 MG tablet Commonly known as: DEMADEX Take 1 tablet (100 mg total) by mouth daily.   traMADol 50 MG tablet Commonly known as: ULTRAM Take 1 tablet by mouth 2 (two) times daily as needed for moderate pain.       Allergies  Allergen Reactions  . Lotrel [Amlodipine Besy-Benazepril Hcl] Swelling    Lips swelling    Consultations:  Nephrology  Procedures/Studies: DG Tibia/Fibula Right  Result Date: 11/16/2020 CLINICAL DATA:  Leg pain EXAM: RIGHT TIBIA AND FIBULA - 2 VIEW COMPARISON:  None. FINDINGS: No acute bony abnormality. Specifically, no fracture, subluxation, or dislocation. Diffuse vascular calcifications. IMPRESSION: No acute bony abnormality. Electronically Signed   By: Rolm Baptise M.D.   On: 11/16/2020 11:42   DG Chest Port 1 View  Result Date: 11/16/2020 CLINICAL DATA:  60 year old male with shortness of breath. EXAM: PORTABLE CHEST 1 VIEW COMPARISON:  Chest radiograph dated 07/14/2020. FINDINGS: Background of diffuse interstitial coarsening. Scattered interstitial nodularity may be chronic although appear new compared to prior radiograph and may represent atypical infection. Clinical correlation recommended. There is mild emphysema. Trace right pleural effusion may be present. No pneumothorax. Mild cardiomegaly. Atherosclerotic calcifications of the aorta. No acute osseous pathology. IMPRESSION: Scattered interstitial nodularity may be chronic or represent atypical infection. Clinical correlation recommended. Electronically Signed   By: Anner Crete M.D.   On: 11/16/2020 02:40    Subjective: Seen this morning and he was very short of breath  and had asterixis and tremors.  He underwent dialysis and prompted them to sign out [place] understanding the risks of worsening, decompensation and even possible death.  Discharge Exam: Vitals:   11/16/20 1430 11/16/20 1448  BP: (!) 182/100 (!) 180/96  Pulse: 80   Resp: 20   Temp:  97.7 F (36.5 C)  SpO2:     Vitals:   11/16/20 1330 11/16/20 1400 11/16/20 1430 11/16/20 1448  BP: (!) 196/98 (!) 180/98 (!) 182/100 (!) 180/96  Pulse: 82 78 80   Resp: '20 20 20   '$ Temp:    97.7 F (36.5 C)  TempSrc:    Oral  SpO2:      Weight:    53 kg  Height:       General: Pt is alert, awake, not in acute distress Cardiovascular: RRR, S1/S2 +, no rubs, no gallops Respiratory: Diminished bilaterally with coarse breath sounds and has slightly increased work of breathing and is wearing supplemental oxygen via nasal cannula this morning Abdominal: Soft, NT, ND, bowel  sounds + Extremities: Minimal edema, no cyanosis  The results of significant diagnostics from this hospitalization (including imaging, microbiology, ancillary and laboratory) are listed below for reference.    Microbiology: Recent Results (from the past 240 hour(s))  Resp Panel by RT-PCR (Flu A&B, Covid) Nasopharyngeal Swab     Status: None   Collection Time: 11/16/20  3:44 AM   Specimen: Nasopharyngeal Swab; Nasopharyngeal(NP) swabs in vial transport medium  Result Value Ref Range Status   SARS Coronavirus 2 by RT PCR NEGATIVE NEGATIVE Final    Comment: (NOTE) SARS-CoV-2 target nucleic acids are NOT DETECTED.  The SARS-CoV-2 RNA is generally detectable in upper respiratory specimens during the acute phase of infection. The lowest concentration of SARS-CoV-2 viral copies this assay can detect is 138 copies/mL. A negative result does not preclude SARS-Cov-2 infection and should not be used as the sole basis for treatment or other patient management decisions. A negative result may occur with  improper specimen  collection/handling, submission of specimen other than nasopharyngeal swab, presence of viral mutation(s) within the areas targeted by this assay, and inadequate number of viral copies(<138 copies/mL). A negative result must be combined with clinical observations, patient history, and epidemiological information. The expected result is Negative.  Fact Sheet for Patients:  EntrepreneurPulse.com.au  Fact Sheet for Healthcare Providers:  IncredibleEmployment.be  This test is no t yet approved or cleared by the [place] FDA and  has been authorized for detection and/or diagnosis of SARS-CoV-2 by FDA under an Emergency Use Authorization (EUA). This EUA will remain  in effect (meaning this test can be used) for the duration of the COVID-19 declaration under Section 564(b)(1) of the Act, 21 U.S.C.section 360bbb-3(b)(1), unless the authorization is terminated  or revoked sooner.       Influenza A by PCR NEGATIVE NEGATIVE Final   Influenza B by PCR NEGATIVE NEGATIVE Final    Comment: (NOTE) The Xpert Xpress SARS-CoV-2/FLU/RSV plus assay is intended as an aid in the diagnosis of influenza from Nasopharyngeal swab specimens and should not be used as a sole basis for treatment. Nasal washings and aspirates are unacceptable for Xpert Xpress SARS-CoV-2/FLU/RSV testing.  Fact Sheet for Patients: EntrepreneurPulse.com.au  Fact Sheet for Healthcare Providers: IncredibleEmployment.be  This test is not yet approved or cleared by the [place] FDA and has been authorized for detection and/or diagnosis of SARS-CoV-2 by FDA under an Emergency Use Authorization (EUA). This EUA will remain in effect (meaning this test can be used) for the duration of the COVID-19 declaration under Section 564(b)(1) of the Act, 21 U.S.C. section 360bbb-3(b)(1), unless the authorization is terminated or revoked.  Performed at [facility], [address] [place], [place] [address]     Labs: BNP (last 3 results) Recent Labs    11/16/20 0224  BNP [id number]*   Basic Metabolic Panel: Recent Labs  Lab 11/16/20 0224 11/16/20 0354  NA 136  --   K 4.7  --   CL 104  --   CO2 19*  --   GLUCOSE 101*  --   BUN 78*  --   CREATININE 15.70*  --   CALCIUM 8.1*  --   MG  --  2.6*  PHOS  --  7.3*   Liver Function Tests: Recent Labs  Lab 11/16/20 0224  AST 24  ALT 9  ALKPHOS 84  BILITOT 0.9  PROT 7.1  ALBUMIN 3.4*   No results for input(s): LIPASE, AMYLASE in the last 168 hours. No results for input(s):  AMMONIA in the last 168 hours. CBC: Recent Labs  Lab 11/16/20 0224  WBC 8.1  HGB 12.2*  HCT 37.7*  MCV 99.5  PLT 200   Cardiac Enzymes: No results for input(s): CKTOTAL, CKMB, CKMBINDEX, TROPONINI in the last 168 hours. BNP: Invalid input(s): POCBNP CBG: No results for input(s): GLUCAP in the last 168 hours. D-Dimer No results for input(s): DDIMER in the last 72 hours. Hgb A1c No results for input(s): HGBA1C in the last 72 hours. Lipid Profile No results for input(s): CHOL, HDL, LDLCALC, TRIG, CHOLHDL, LDLDIRECT in the last 72 hours. Thyroid function studies No results for input(s): TSH, T4TOTAL, T3FREE, THYROIDAB in the last 72 hours.  Invalid input(s): FREET3 Anemia work up No results for input(s): VITAMINB12, FOLATE, FERRITIN, TIBC, IRON, RETICCTPCT in the last 72 hours. Urinalysis No results found for: COLORURINE, APPEARANCEUR, [place], [place], GLUCOSEU, [place], [place], [place], PROTEINUR, UROBILINOGEN, NITRITE, LEUKOCYTESUR Sepsis Labs Invalid input(s): PROCALCITONIN,  WBC,  LACTICIDVEN Microbiology Recent Results (from the past 240 hour(s))  Resp Panel by RT-PCR (Flu A&B, Covid) Nasopharyngeal Swab     Status: None   Collection Time: 11/16/20  3:44 AM   Specimen: Nasopharyngeal Swab; Nasopharyngeal(NP) swabs in vial transport medium  Result Value Ref Range Status   SARS  Coronavirus 2 by RT PCR NEGATIVE NEGATIVE Final    Comment: (NOTE) SARS-CoV-2 target nucleic acids are NOT DETECTED.  The SARS-CoV-2 RNA is generally detectable in upper respiratory specimens during the acute phase of infection. The lowest concentration of SARS-CoV-2 viral copies this assay can detect is 138 copies/mL. A negative result does not preclude SARS-Cov-2 infection and should not be used as the sole basis for treatment or other patient management decisions. A negative result may occur with  improper specimen collection/handling, submission of specimen other than nasopharyngeal swab, presence of viral mutation(s) within the areas targeted by this assay, and inadequate number of viral copies(<138 copies/mL). A negative result must be combined with clinical observations, patient history, and epidemiological information. The expected result is Negative.  Fact Sheet for Patients:  EntrepreneurPulse.com.au  Fact Sheet for Healthcare Providers:  IncredibleEmployment.be  This test is no t yet approved or cleared by the [place] FDA and  has been authorized for detection and/or diagnosis of SARS-CoV-2 by FDA under an Emergency Use Authorization (EUA). This EUA will remain  in effect (meaning this test can be used) for the duration of the COVID-19 declaration under Section 564(b)(1) of the Act, 21 U.S.C.section 360bbb-3(b)(1), unless the authorization is terminated  or revoked sooner.       Influenza A by PCR NEGATIVE NEGATIVE Final   Influenza B by PCR NEGATIVE NEGATIVE Final    Comment: (NOTE) The Xpert Xpress SARS-CoV-2/FLU/RSV plus assay is intended as an aid in the diagnosis of influenza from Nasopharyngeal swab specimens and should not be used as a sole basis for treatment. Nasal washings and aspirates are unacceptable for Xpert Xpress SARS-CoV-2/FLU/RSV testing.  Fact Sheet for  Patients: EntrepreneurPulse.com.au  Fact Sheet for Healthcare Providers: IncredibleEmployment.be  This test is not yet approved or cleared by the [place] FDA and has been authorized for detection and/or diagnosis of SARS-CoV-2 by FDA under an Emergency Use Authorization (EUA). This EUA will remain in effect (meaning this test can be used) for the duration of the COVID-19 declaration under Section 564(b)(1) of the Act, 21 U.S.C. section 360bbb-3(b)(1), unless the authorization is terminated or revoked.  Performed at [facility], [address], [place], [place] [address]    Time  coordinating discharge: 25 minutes  SIGNED:  Kerney Elbe, DO [facility] 11/16/2020, 5:49 PM Pager is on [place]  If 7PM-7AM, please contact night-coverage [email or web address]

## 2020-11-16 NOTE — ED Triage Notes (Signed)
Pt c/o sob and has not have dialysis since Wed due to staffing shortages.
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
## 2020-11-16 NOTE — Progress Notes (Signed)
Date: 11/16/2020 Patient: DALIN AAGARD Admitted: 11/16/2020  1:04 AM Attending Provider: Kerney Elbe, DO  Marcial Caroline More has made the decision for the patient to leave the [facility] ICU against the advice of Kerney Elbe, DO.  He has been informed and understands the inherent risks, including death.  He has decided to accept the responsibility for this decision. Daveon Caroline More and all necessary parties have been advised that he may return for further evaluation or treatment. His condition at time of discharge was stable.  Draper G Strider had current vital signs as follows:  Blood pressure (!) 180/96, pulse 80, temperature 97.7 F (36.5 C), temperature source Oral, resp. rate 20, height '5\' 10"'$  (1.778 m), weight 53 kg, SpO2 100 %.   Joakim Caroline More has signed the Leaving Against Medical Advice form prior to leaving the department.  Paulina Fusi 11/16/2020

## 2020-11-16 NOTE — TOC Initial Note (Signed)
Transition of Care [facility]) - Initial/Assessment Note    Patient Details  Name: Luke Mueller MRN: [id number] Date of Birth: 01-17-1961  Transition of Care [facility]) CM/SW Contact:    Iona Beard, [place] Phone Number: 11/16/2020, 9:41 AM  Clinical Narrative:                 Pt high risk for admission. CSW spoke with pts mother to complete pt assessment due to pt not answer room phone or cell phone. Pt lives with his mom. Pt is able to complete ADLs independently and he provides his own transportation. Pt has not used [facility] services. Pt does not use any DME. Pt does not use home O2 but he does have it provided to him while at dialysis. Pt also uses a tank of his moms if he needs to when at home. TOC to follow for possible d/c needs.   Expected Discharge Plan: Home/Self Care Barriers to Discharge: Continued Medical Work up   Patient Goals and CMS Choice Patient states their goals for this hospitalization and ongoing recovery are:: Go home CMS Medicare.gov Compare Post Acute Care list provided to:: Patient Represenative (must comment) Choice offered to / list presented to : Parent  Expected Discharge Plan and Services Expected Discharge Plan: Home/Self Care In-house Referral: NA Discharge Planning Services: NA Post Acute Care Choice: NA Living arrangements for the past 2 months: Apartment                 DME Arranged: N/A DME Agency: NA       HH Arranged: NA [place] Agency: NA        Prior Living Arrangements/Services Living arrangements for the past 2 months: Apartment Lives with:: Parents Patient language and need for interpreter reviewed:: Yes Do you feel safe going back to the place where you live?: Yes      Need for Family Participation in Patient Care: Yes (Comment) Care giver support system in place?: Yes (comment)   Criminal Activity/Legal Involvement Pertinent to Current Situation/Hospitalization: No - Comment as needed  Activities of Daily Living Home Assistive  Devices/Equipment: None ADL Screening (condition at time of admission) Patient's cognitive ability adequate to safely complete daily activities?: Yes Is the patient deaf or have difficulty hearing?: No Does the patient have difficulty seeing, even when wearing glasses/contacts?: No Does the patient have difficulty concentrating, remembering, or making decisions?: No Patient able to express need for assistance with ADLs?: Yes Does the patient have difficulty dressing or bathing?: No Independently performs ADLs?: Yes (appropriate for developmental age) Does the patient have difficulty walking or climbing stairs?: No Weakness of Legs: None Weakness of Arms/Hands: None  Permission Sought/Granted                  Emotional Assessment Appearance:: Appears stated age     Orientation: : Oriented to Self,Oriented to Place,Oriented to Situation,Oriented to  Time Alcohol / Substance Use: Not Applicable Psych Involvement: No (comment)  Admission diagnosis:  Acute pulmonary edema (HCC) [J81.0] Uremia [N19] SOB (shortness of breath) [R06.02] Acute exacerbation of CHF (congestive heart failure) ([place]) [I50.9] Hypervolemia, unspecified hypervolemia type [E87.70] Patient Active Problem List   Diagnosis Date Noted  . Acute exacerbation of CHF (congestive heart failure) ([place]) 11/16/2020  . Shortness of breath 11/16/2020  . Hypertensive urgency 11/16/2020  . Elevated brain natriuretic peptide (BNP) level 11/16/2020  . GERD (gastroesophageal reflux disease) 08/30/2020  . Colon cancer screening 08/30/2020  . Delirium tremens ([place]) 07/15/2020  . Hypoalbuminemia  07/12/2020  . Prolonged QT interval 07/12/2020  . Hepatic cirrhosis ([place]) 04/29/2020  . Uremia of renal origin 09/22/2019  . Uremia 09/21/2019  . Generalized weakness 09/21/2019  . HBP (high blood pressure) 09/21/2019  . Anemia, macrocytic 07/31/2019  . Essential hypertension   . Volume overload 10/01/2018  . Anemia in ESRD  (end-stage renal disease) ([place]) 10/01/2018  . Leukocytosis 10/01/2018  . Lobar pneumonia ([place]) 09/04/2018  . HCAP (healthcare-associated pneumonia)   . Pneumonia 09/02/2018  . ESRD (end stage renal disease) ([place]) 07/24/2018  . Nonspecific chest pain 07/24/2018  . Hyperkalemia 07/24/2018  . Acute respiratory failure with hypoxia ([place]) 07/24/2018  . Cigarette smoker   . Pulmonary edema 07/23/2018  . End stage renal disease ([place]) 04/08/2013  . PAD (peripheral artery disease) ([place]) 11/22/2012  . Claudication of right lower extremity ([place]) 11/22/2012  . CKD (chronic kidney disease) stage 4, GFR 15-29 ml/min (HCC) 11/22/2012  . COPD  GOLD 2/ active smoker AB hx  11/22/2012  . HTN (hypertension), malignant 11/22/2012   PCP:  Rosita Fire, MD Pharmacy:   [facility], [address] [place] [address] Phone: [phone] Fax: [phone]  [id number] - [place], [place] [address] [place] [address] Phone: [phone] Fax: [phone]     Social Determinants of Health (SDOH) Interventions    Readmission Risk Interventions Readmission Risk Prevention Plan 11/16/2020 07/16/2020  Transportation Screening Complete Complete  HRI or Home Care Consult Complete -  Social Work Consult for [place] Planning/Counseling Complete -  Palliative Care Screening Not Applicable -  Medication Review Press photographer) Complete Complete  HRI or [place] - Complete  SW Recovery Care/Counseling Consult - Complete  Palliative Care Screening - Not Applicable  Skilled Nursing Facility - Not Applicable  Some recent data might be hidden

## 2020-11-16 NOTE — ED Notes (Signed)
Spoke with Windy Canny, Mother, and updated her on patient status. All questions answered at this time and she is asking to be updated if patient gets admitted. # in chart.

## 2020-11-17 DIAGNOSIS — Z992 Dependence on renal dialysis: Secondary | ICD-10-CM | POA: Diagnosis not present

## 2020-11-17 DIAGNOSIS — N186 End stage renal disease: Secondary | ICD-10-CM | POA: Diagnosis not present

## 2020-12-08 DEATH — deceased
